# Patient Record
Sex: Female | Born: 1965 | Race: White | Hispanic: No | Marital: Married | Smoking: Never smoker
Health system: Southern US, Community
[De-identification: ages and names within clinical notes are randomized; demographics above are authoritative.]

## PROBLEM LIST (undated history)

## (undated) DIAGNOSIS — Z9889 Other specified postprocedural states: Secondary | ICD-10-CM

## (undated) DIAGNOSIS — Z8489 Family history of other specified conditions: Secondary | ICD-10-CM

## (undated) DIAGNOSIS — M1990 Unspecified osteoarthritis, unspecified site: Secondary | ICD-10-CM

## (undated) DIAGNOSIS — E049 Nontoxic goiter, unspecified: Secondary | ICD-10-CM

## (undated) DIAGNOSIS — E119 Type 2 diabetes mellitus without complications: Secondary | ICD-10-CM

## (undated) DIAGNOSIS — I10 Essential (primary) hypertension: Secondary | ICD-10-CM

## (undated) DIAGNOSIS — K589 Irritable bowel syndrome without diarrhea: Secondary | ICD-10-CM

## (undated) DIAGNOSIS — R112 Nausea with vomiting, unspecified: Secondary | ICD-10-CM

## (undated) HISTORY — PX: DILATION AND CURETTAGE OF UTERUS: SHX78

## (undated) HISTORY — PX: OTHER SURGICAL HISTORY: SHX169

## (undated) HISTORY — PX: DENTAL SURGERY: SHX609

## (undated) HISTORY — PX: CHOLECYSTECTOMY: SHX55

## (undated) HISTORY — PX: ABDOMINAL HYSTERECTOMY: SHX81

---

## 1998-09-07 ENCOUNTER — Other Ambulatory Visit: Admission: RE | Admit: 1998-09-07 | Discharge: 1998-09-07 | Payer: Self-pay | Admitting: *Deleted

## 1999-10-11 ENCOUNTER — Other Ambulatory Visit: Admission: RE | Admit: 1999-10-11 | Discharge: 1999-10-11 | Payer: Self-pay | Admitting: *Deleted

## 1999-10-28 ENCOUNTER — Observation Stay (HOSPITAL_COMMUNITY): Admission: AD | Admit: 1999-10-28 | Discharge: 1999-10-29 | Payer: Self-pay | Admitting: *Deleted

## 2000-10-23 ENCOUNTER — Other Ambulatory Visit: Admission: RE | Admit: 2000-10-23 | Discharge: 2000-10-23 | Payer: Self-pay | Admitting: *Deleted

## 2000-12-21 ENCOUNTER — Encounter (INDEPENDENT_AMBULATORY_CARE_PROVIDER_SITE_OTHER): Payer: Self-pay | Admitting: Specialist

## 2000-12-21 ENCOUNTER — Other Ambulatory Visit: Admission: RE | Admit: 2000-12-21 | Discharge: 2000-12-21 | Payer: Self-pay | Admitting: *Deleted

## 2000-12-27 ENCOUNTER — Encounter: Payer: Self-pay | Admitting: *Deleted

## 2000-12-27 ENCOUNTER — Ambulatory Visit (HOSPITAL_COMMUNITY): Admission: RE | Admit: 2000-12-27 | Discharge: 2000-12-27 | Payer: Self-pay | Admitting: *Deleted

## 2001-06-13 ENCOUNTER — Inpatient Hospital Stay (HOSPITAL_COMMUNITY): Admission: RE | Admit: 2001-06-13 | Discharge: 2001-06-15 | Payer: Self-pay | Admitting: Obstetrics and Gynecology

## 2001-06-13 ENCOUNTER — Encounter (INDEPENDENT_AMBULATORY_CARE_PROVIDER_SITE_OTHER): Payer: Self-pay | Admitting: Specialist

## 2002-06-13 ENCOUNTER — Other Ambulatory Visit: Admission: RE | Admit: 2002-06-13 | Discharge: 2002-06-13 | Payer: Self-pay | Admitting: Obstetrics and Gynecology

## 2003-07-28 ENCOUNTER — Ambulatory Visit (HOSPITAL_COMMUNITY): Admission: RE | Admit: 2003-07-28 | Discharge: 2003-07-28 | Payer: Self-pay | Admitting: Family Medicine

## 2003-07-28 ENCOUNTER — Encounter: Payer: Self-pay | Admitting: Family Medicine

## 2004-04-06 ENCOUNTER — Other Ambulatory Visit: Admission: RE | Admit: 2004-04-06 | Discharge: 2004-04-06 | Payer: Self-pay | Admitting: Obstetrics and Gynecology

## 2005-04-15 ENCOUNTER — Other Ambulatory Visit: Admission: RE | Admit: 2005-04-15 | Discharge: 2005-04-15 | Payer: Self-pay | Admitting: Obstetrics and Gynecology

## 2007-05-04 ENCOUNTER — Encounter: Admission: RE | Admit: 2007-05-04 | Discharge: 2007-05-04 | Payer: Self-pay | Admitting: Obstetrics and Gynecology

## 2007-11-05 ENCOUNTER — Encounter: Admission: RE | Admit: 2007-11-05 | Discharge: 2007-11-05 | Payer: Self-pay | Admitting: Obstetrics and Gynecology

## 2008-04-28 ENCOUNTER — Encounter: Admission: RE | Admit: 2008-04-28 | Discharge: 2008-04-28 | Payer: Self-pay | Admitting: Obstetrics and Gynecology

## 2009-05-06 ENCOUNTER — Encounter: Admission: RE | Admit: 2009-05-06 | Discharge: 2009-05-06 | Payer: Self-pay | Admitting: Obstetrics and Gynecology

## 2010-05-11 ENCOUNTER — Encounter: Admission: RE | Admit: 2010-05-11 | Discharge: 2010-05-11 | Payer: Self-pay | Admitting: Obstetrics and Gynecology

## 2011-04-29 NOTE — Discharge Summary (Signed)
[facility]  Patient:    Dana Bruce, Dana Bruce Visit Number: [id number] MRN: [id number]          Service Type: GYN Location: 4W [id number] 01 Attending Physician:  Rhina Brackett Dictated by:   Duke Salvia. Marcelle Overlie, M.D. Admit Date:  06/13/2001 Discharge Date: 06/15/2001                             Discharge Summary  DISCHARGE DIAGNOSES: 1. Leiomyoma. 2. Total abdominal hysterectomy this admission.  For summary of the history and physical examination, please see admission H&P for details.  Briefly, the patient is a 45 year old, G3, P2, ectopic 1, with severe menorrhagia secondary to leiomyoma, presents now for definitive hysterectomy.  HOSPITAL COURSE:  On 7/3, under general anesthesia, the patient underwent total abdominal hysterectomy with an estimated blood loss of 250 cc.  A 14 week sized uterus was noted.  On the first postoperative day, 7/4, hemoglobin was 8.5, her diet was advanced, she remained afebrile at that time.  On 7/5, she was ambulating without difficulty, had established normal bowel and urinary function, again remained afebrile, was ready for discharge at that point.  She was discharged home with the clips still intact.  LABORATORY DATA:  Preoperative hemoglobin 9.9, postoperative on 7/4 was 8.5. Admission UA showed large hemoglobin, small leukocyte esterase, few epithelial cells.  Blood type is O-.  Antibody screen was negative.  Pathology report showed a 637 g uterus with chronic cervicitis, benign endometrial polyp, and extensive adenomyosis.  DISPOSITION:  The patient is discharged on Tylox p.r.n. pain.  Will continue with her iron daily.  FOLLOWUP:  Will return to our office in 3 days to have the clips removed. Advised to report any incisional redness or drainage, increased pain or bleeding, or fever over 101.  She was given specific instructions regarding diet, sex, exercise.  CONDITION ON DISCHARGE:  Good.  ACTIVITY:   Graded increase. Dictated by:   Duke Salvia. Marcelle Overlie, M.D. Attending Physician:  Rhina Brackett DD:  08/22/01 TD:  08/22/01 Job: [id number]

## 2011-04-29 NOTE — H&P (Signed)
[facility]  Patient:    Dana Bruce, Dana Bruce                         MRN: [id number] Adm. Date:  06/13/01 Attending:  Duke Salvia. Marcelle Overlie, M.D.                         History and Physical  CHIEF COMPLAINT:  Menorrhagia, leiomyoma.  HISTORY OF PRESENT ILLNESS:  A 45 year old G3, P2, ectopic 1.  She has had one prior salpingectomy.  I had last seen her in 1997.  She presented back in March 2002 after seeing Dr. Elliot Gault for evaluation of menorrhagia.  She stated that last year her hemoglobin had got down to the 3.5 range and she was transfused, having extremely heavy periods.  He had started her on Provera for management of her bleeding.  Recent ultrasound report showed an 8.5 cm fibroid.  She has been on iron and Provera and presents now for definitive TAH.  This procedure including risks relative to bleeding, infection, transfusion, adjacent organ injury discussed with her.  Other risks regarding wound infection, phlebitis discussed also.  The ultrasound report was dated December 27, 2000 that showed a large fibroid in the posterior uterus 8.6 cm.  No evidence of any endometrial thickening. EMB done by Dr. Elliot Gault January 2002 showed blood mixed with proliferative endometrium.  No hyperplasia noted.  PAST MEDICAL HISTORY:  Two vaginal deliveries at term without complication.  REVIEW OF SYSTEMS:  Significant for history of chronic anovulation.  She has had two prior D&Cs for miscarriage and also a laparoscopic salpingectomy in 1994.  Dr. Lurene Shadow did a laparoscopic cholecystectomy in 1995.  PHYSICAL EXAMINATION  VITAL SIGNS:  Temperature 98.2, blood pressure 120/82.  HEENT:  Unremarkable.  NECK:  Supple without masses.  LUNGS:  Clear.  CARDIOVASCULAR:  Regular rate and rhythm without murmurs, rubs, gallops noted.  BREASTS:  Without masses.  ABDOMEN:  Soft, flat, nontender.  PELVIC:  Difficult due to her weight.  Uterus is mid position, upper limits  of normal size.  Adnexa:  Unremarkable.  IMPRESSION:  Symptomatic leiomyoma with significant anemia.  PLAN:  TAH.  Procedure and risks discussed as above. DD:  06/07/01 TD:  06/07/01 Job: [id number]

## 2011-04-29 NOTE — Op Note (Signed)
[facility]  Patient:    Dana Bruce, Dana Bruce                   MRN: [id number] Proc. Date: 06/13/01 Adm. Date:  [id number] Attending:  Rhina Brackett                           Operative Report  PREOPERATIVE DIAGNOSES:  Symptomatic leiomyoma with menorrhagia and pelvic pain.  POSTOPERATIVE DIAGNOSES:  Symptomatic leiomyoma with menorrhagia and pelvic pain.  PROCEDURE:  Total abdominal hysterectomy.  SURGEON:  Dr. Marcelle Overlie.  ASSISTANT:  Dr. Henderson Cloud.  ANESTHESIA:  General endotracheal.  COMPLICATIONS:  None.  DRAINS:  Foley catheter.  ESTIMATED BLOOD LOSS:  250  DESCRIPTION OF PROCEDURE:  The patient was sent to the operating room after an adequate level of general endotracheal anesthesia was obtained. With the patient supine, the abdomen was prepped and draped in the usual manner for a sterile abdominal procedure. The vagina was prepped separately with Betadine, Foley catheter positioned. Three finger breadths above the symphysis, a Pfannenstiel incision was made, carried down to the fascia with ______ and extended transversely. The rectus muscle was divided in the midline, peritoneum entered superiorly without incident and extended in a vertical manner. Retractor was positioned, bowels packed superiorly out of the field. The patient placed in slight Trendelenburg. The uterus itself was enlarged to 14 week size, adnexa were unremarkable. The utero-ovarian pedicles were clamped and held for traction. The left round ligament was clamped ______ ligated with #0 Dexon. The left utero-ovarian pedicle was isolated, clamped, divided, first free tied followed by a suture ligature of #0 Dexon, the exact same repeated on the opposite side conserving both ovaries. The peritoneum was carried around to the midline and with sharp and blunt dissection, the bladder was advanced below the cervical vaginal junction. The uterine vasculature pedicles were then  skeletonized, clamped, divided and suture ligated with #0 Dexon. In a sequential manner staying close to the uterus and assuring that the bladder was well below the cardinal ligament, the uterosacral ligament and cervicovaginal pedicles were clamped, divided and suture ligated with #0 Dexon. The cuff was closed with interrupted figure-of-eight #0 Dexon sutures. The pelvis was irrigated with saline and aspirated, all major pedicles carefully inspected and noted to be hemostatic. Prior to closure, sponge, needle and instrument counts were reported as correct x 2. The appendix was inspected and noted to be normal. There were no other abnormalities noted. The rectus muscles reapproximated with 2-0 Dexon interrupted sutures. The fascia closed from laterally to mid laterally with a #0 PDS suture. The subcutaneous ______ was hemostatic, clips and Steri-Strips on the skin. The patient tolerated the procedure well and went to the recovery room in good condition. DD:  06/13/01 TD:  06/13/01 Job: [id number]

## 2011-05-16 ENCOUNTER — Other Ambulatory Visit: Payer: Self-pay | Admitting: Obstetrics and Gynecology

## 2011-05-16 DIAGNOSIS — Z1231 Encounter for screening mammogram for malignant neoplasm of breast: Secondary | ICD-10-CM

## 2011-06-01 ENCOUNTER — Ambulatory Visit
Admission: RE | Admit: 2011-06-01 | Discharge: 2011-06-01 | Disposition: A | Discharge status: Home / Self Care | Payer: BC Managed Care – PPO | Source: Ambulatory Visit | Attending: Obstetrics and Gynecology | Admitting: Obstetrics and Gynecology

## 2011-06-01 DIAGNOSIS — Z1231 Encounter for screening mammogram for malignant neoplasm of breast: Secondary | ICD-10-CM

## 2012-09-07 ENCOUNTER — Other Ambulatory Visit: Payer: Self-pay | Admitting: Obstetrics and Gynecology

## 2012-09-07 DIAGNOSIS — Z1231 Encounter for screening mammogram for malignant neoplasm of breast: Secondary | ICD-10-CM

## 2012-10-03 ENCOUNTER — Ambulatory Visit: Payer: BC Managed Care – PPO

## 2012-10-17 ENCOUNTER — Ambulatory Visit
Admission: RE | Admit: 2012-10-17 | Discharge: 2012-10-17 | Disposition: A | Discharge status: Home / Self Care | Payer: BC Managed Care – PPO | Source: Ambulatory Visit | Attending: Obstetrics and Gynecology | Admitting: Obstetrics and Gynecology

## 2012-10-17 DIAGNOSIS — Z1231 Encounter for screening mammogram for malignant neoplasm of breast: Secondary | ICD-10-CM

## 2013-09-18 ENCOUNTER — Other Ambulatory Visit: Payer: Self-pay

## 2013-09-18 DIAGNOSIS — Z1231 Encounter for screening mammogram for malignant neoplasm of breast: Secondary | ICD-10-CM

## 2013-10-21 ENCOUNTER — Ambulatory Visit
Admission: RE | Admit: 2013-10-21 | Discharge: 2013-10-21 | Disposition: A | Discharge status: Home / Self Care | Payer: BC Managed Care – PPO | Source: Ambulatory Visit

## 2013-10-21 DIAGNOSIS — Z1231 Encounter for screening mammogram for malignant neoplasm of breast: Secondary | ICD-10-CM

## 2013-10-25 ENCOUNTER — Other Ambulatory Visit: Payer: Self-pay | Admitting: Obstetrics and Gynecology

## 2013-10-25 DIAGNOSIS — R928 Other abnormal and inconclusive findings on diagnostic imaging of breast: Secondary | ICD-10-CM

## 2013-11-13 ENCOUNTER — Ambulatory Visit
Admission: RE | Admit: 2013-11-13 | Discharge: 2013-11-13 | Disposition: A | Discharge status: Home / Self Care | Payer: BC Managed Care – PPO | Source: Ambulatory Visit | Attending: Obstetrics and Gynecology | Admitting: Obstetrics and Gynecology

## 2013-11-13 DIAGNOSIS — R928 Other abnormal and inconclusive findings on diagnostic imaging of breast: Secondary | ICD-10-CM

## 2014-01-23 ENCOUNTER — Other Ambulatory Visit (HOSPITAL_COMMUNITY): Payer: Self-pay | Admitting: Obstetrics and Gynecology

## 2014-01-23 DIAGNOSIS — E041 Nontoxic single thyroid nodule: Secondary | ICD-10-CM

## 2014-01-27 ENCOUNTER — Ambulatory Visit (HOSPITAL_COMMUNITY): Payer: BC Managed Care – PPO

## 2014-01-30 ENCOUNTER — Ambulatory Visit (HOSPITAL_COMMUNITY): Payer: BC Managed Care – PPO

## 2014-02-06 ENCOUNTER — Ambulatory Visit (HOSPITAL_COMMUNITY): Payer: BC Managed Care – PPO

## 2014-02-20 ENCOUNTER — Ambulatory Visit (HOSPITAL_COMMUNITY)
Admission: RE | Admit: 2014-02-20 | Discharge: 2014-02-20 | Disposition: A | Discharge status: Home / Self Care | Payer: BC Managed Care – PPO | Source: Ambulatory Visit | Attending: Obstetrics and Gynecology | Admitting: Obstetrics and Gynecology

## 2014-02-20 DIAGNOSIS — E042 Nontoxic multinodular goiter: Secondary | ICD-10-CM | POA: Insufficient documentation

## 2014-02-20 DIAGNOSIS — E041 Nontoxic single thyroid nodule: Secondary | ICD-10-CM | POA: Insufficient documentation

## 2014-02-27 ENCOUNTER — Other Ambulatory Visit: Payer: Self-pay | Admitting: Obstetrics and Gynecology

## 2014-02-27 DIAGNOSIS — E041 Nontoxic single thyroid nodule: Secondary | ICD-10-CM

## 2014-03-04 ENCOUNTER — Ambulatory Visit
Admission: RE | Admit: 2014-03-04 | Discharge: 2014-03-04 | Disposition: A | Discharge status: Home / Self Care | Payer: BC Managed Care – PPO | Source: Ambulatory Visit | Attending: Obstetrics and Gynecology | Admitting: Obstetrics and Gynecology

## 2014-03-04 ENCOUNTER — Other Ambulatory Visit (HOSPITAL_COMMUNITY)
Admission: RE | Admit: 2014-03-04 | Discharge: 2014-03-04 | Disposition: A | Discharge status: Home / Self Care | Payer: BC Managed Care – PPO | Source: Ambulatory Visit | Attending: Interventional Radiology | Admitting: Interventional Radiology

## 2014-03-04 DIAGNOSIS — E041 Nontoxic single thyroid nodule: Secondary | ICD-10-CM

## 2014-03-05 ENCOUNTER — Other Ambulatory Visit: Payer: BC Managed Care – PPO

## 2014-03-18 ENCOUNTER — Ambulatory Visit (INDEPENDENT_AMBULATORY_CARE_PROVIDER_SITE_OTHER): Payer: BC Managed Care – PPO | Admitting: Surgery

## 2014-03-24 ENCOUNTER — Other Ambulatory Visit (INDEPENDENT_AMBULATORY_CARE_PROVIDER_SITE_OTHER): Payer: Self-pay

## 2014-03-24 ENCOUNTER — Encounter (INDEPENDENT_AMBULATORY_CARE_PROVIDER_SITE_OTHER): Payer: Self-pay | Admitting: Surgery

## 2014-03-24 ENCOUNTER — Ambulatory Visit (INDEPENDENT_AMBULATORY_CARE_PROVIDER_SITE_OTHER): Payer: BC Managed Care – PPO | Admitting: Surgery

## 2014-03-24 VITALS — BP 160/104 | HR 68 | Temp 97.6°F | Resp 18 | Ht 67.0 in | Wt >= 6400 oz

## 2014-03-24 DIAGNOSIS — E049 Nontoxic goiter, unspecified: Secondary | ICD-10-CM

## 2014-03-24 DIAGNOSIS — E041 Nontoxic single thyroid nodule: Secondary | ICD-10-CM

## 2014-03-24 NOTE — Patient Instructions (Signed)

## 2014-03-24 NOTE — Progress Notes (Signed)
[facility]  Chief Complaint  Patient presents with  . New Evaluation    evaluate right thyroid nodule - referral from Dr. Richarda Overlieichard Holland    HISTORY: Patient is a 48 year old female referred by her gynecologist for evaluation of newly diagnosed thyroid nodule. Patient had complained about some changes in her neck. She was examined by her gynecologist and sent for ultrasound evaluation. This was performed in March 2015. This showed an enlarged thyroid gland with the right lobe measuring 6.3 cm and left lobe measuring 6.5 cm. There was a solitary dominant complex nodule in the right thyroid lobe measuring 2.9 cm. Ultrasound-guided fine-needle aspiration biopsy was performed. This shows benign cytopathology consistent with follicular nodule.  Patient has no prior history of thyroid disease. She has never been on thyroid medication. She has had no prior head or neck surgery. There is no family history of thyroid disease. There is no family history of endocrine neoplasms.  History reviewed. No pertinent past medical history.  Current Outpatient Prescriptions  Medication Sig Dispense Refill  . amLODipine (NORVASC) 10 MG tablet       . aspirin 81 MG tablet Take 81 mg by mouth daily.      . hydrocortisone-pramoxine (ANALPRAM-HC) 2.5-1 % rectal cream       . hyoscyamine (LEVSIN SL) 0.125 MG SL tablet       . metFORMIN (GLUCOPHAGE) 1000 MG tablet       . metoprolol (LOPRESSOR) 50 MG tablet       . ONE TOUCH ULTRA TEST test strip       . pioglitazone (ACTOS) 30 MG tablet       . promethazine (PHENERGAN) 25 MG tablet       . simvastatin (ZOCOR) 20 MG tablet       . valsartan-hydrochlorothiazide (DIOVAN-HCT) 320-25 MG per tablet        No current facility-administered medications for this visit.    Allergies  Allergen Reactions  . Lasix [Furosemide] Itching  . Penicillins Itching    History reviewed. No pertinent family history.  History   Social  History  . Marital Status: Married    Spouse Name: N/A    Number of Children: N/A  . Years of Education: N/A   Social History Main Topics  . Smoking status: Never Smoker   . Smokeless tobacco: Never Used  . Alcohol Use: No  . Drug Use: No  . Sexual Activity: None   Other Topics Concern  . None   Social History Narrative  . None    REVIEW OF SYSTEMS - PERTINENT POSITIVES ONLY: Denies tremor. Denies palpitation. Denies masses. Denies compressive symptoms.  EXAM: Filed Vitals:   03/24/14 1129  BP: 160/104  Pulse: 68  Temp: 97.6 F (36.4 C)  Resp: 18    GENERAL: well-developed, well-nourished, no acute distress HEENT: normocephalic; pupils equal and reactive; sclerae clear; dentition good; mucous membranes moist NECK:  Right thyroid lobe with palpable 2-3 cm nodule in the mid lobe, smooth, mobile, nontender; left low without palpable abnormality; asymmetric on extension; no palpable anterior or posterior cervical lymphadenopathy; no supraclavicular masses; no tenderness CHEST: clear to auscultation bilaterally without rales, rhonchi, or wheezes CARDIAC: regular rate and rhythm without significant murmur; peripheral pulses are full EXT:  non-tender without edema; no deformity NEURO: no gross focal deficits; no sign of tremor   LABORATORY RESULTS: See [facility] (CHL-Epic) for most recent results  RADIOLOGY RESULTS: See [facility] (CHL-Epic) for most recent  results  IMPRESSION: #1 solitary right thyroid nodule, 2.9 cm, complex, with benign cytopathology #2 thyroid goiter  PLAN: The patient and I discussed the above findings at length. We reviewed her ultrasound results and her fine-needle aspiration cytology results. We discussed options for management including observation and surgical resection.  I have recommended observation. I would like to see her back in 6 months for physical examination. Prior to that visit we will obtain a repeat thyroid ultrasound  and a TSH level.  Patient understands and agrees with this plan.  Velora Hecklerodd M. Aleece Loyd, MD, FACS General & Endocrine Surgery [facility]  Primary Care Physician: Lolita PatellaEADE,ROBERT ALEXANDER, MD

## 2014-03-27 ENCOUNTER — Telehealth (INDEPENDENT_AMBULATORY_CARE_PROVIDER_SITE_OTHER): Payer: Self-pay | Admitting: *Deleted

## 2014-03-27 NOTE — Telephone Encounter (Signed)
Pt advised of attached msg. 

## 2014-03-27 NOTE — Telephone Encounter (Signed)
LM for pt to call.  Please inform pt of her appt for her Ultrasound for 10.6.2015 @ 11:15 @ [facility] [address]  Victorino DikeJennifer

## 2014-05-06 ENCOUNTER — Other Ambulatory Visit: Payer: Self-pay | Admitting: Obstetrics and Gynecology

## 2014-05-06 DIAGNOSIS — R921 Mammographic calcification found on diagnostic imaging of breast: Secondary | ICD-10-CM

## 2014-05-16 ENCOUNTER — Ambulatory Visit
Admission: RE | Admit: 2014-05-16 | Discharge: 2014-05-16 | Disposition: A | Discharge status: Home / Self Care | Payer: BC Managed Care – PPO | Source: Ambulatory Visit | Attending: Obstetrics and Gynecology | Admitting: Obstetrics and Gynecology

## 2014-05-16 DIAGNOSIS — R921 Mammographic calcification found on diagnostic imaging of breast: Secondary | ICD-10-CM

## 2014-09-11 LAB — TSH: TSH: 2.953 u[IU]/mL (ref 0.350–4.500)

## 2014-09-16 ENCOUNTER — Ambulatory Visit
Admission: RE | Admit: 2014-09-16 | Discharge: 2014-09-16 | Disposition: A | Discharge status: Home / Self Care | Payer: BC Managed Care – PPO | Source: Ambulatory Visit | Attending: Surgery | Admitting: Surgery

## 2014-09-16 DIAGNOSIS — E049 Nontoxic goiter, unspecified: Secondary | ICD-10-CM

## 2014-09-16 DIAGNOSIS — E041 Nontoxic single thyroid nodule: Secondary | ICD-10-CM

## 2014-09-29 ENCOUNTER — Other Ambulatory Visit (INDEPENDENT_AMBULATORY_CARE_PROVIDER_SITE_OTHER): Payer: Self-pay

## 2014-09-29 ENCOUNTER — Ambulatory Visit (INDEPENDENT_AMBULATORY_CARE_PROVIDER_SITE_OTHER): Payer: BC Managed Care – PPO | Admitting: Surgery

## 2014-09-29 DIAGNOSIS — E042 Nontoxic multinodular goiter: Secondary | ICD-10-CM

## 2014-12-19 ENCOUNTER — Other Ambulatory Visit: Payer: Self-pay

## 2014-12-19 DIAGNOSIS — Z1231 Encounter for screening mammogram for malignant neoplasm of breast: Secondary | ICD-10-CM

## 2014-12-24 ENCOUNTER — Ambulatory Visit
Admission: RE | Admit: 2014-12-24 | Discharge: 2014-12-24 | Disposition: A | Discharge status: Home / Self Care | Payer: BLUE CROSS/BLUE SHIELD | Source: Ambulatory Visit

## 2014-12-24 DIAGNOSIS — Z1231 Encounter for screening mammogram for malignant neoplasm of breast: Secondary | ICD-10-CM

## 2015-09-14 ENCOUNTER — Ambulatory Visit
Admission: RE | Admit: 2015-09-14 | Discharge: 2015-09-14 | Disposition: A | Discharge status: Home / Self Care | Payer: BLUE CROSS/BLUE SHIELD | Source: Ambulatory Visit | Attending: Surgery | Admitting: Surgery

## 2015-09-14 DIAGNOSIS — E042 Nontoxic multinodular goiter: Secondary | ICD-10-CM

## 2015-09-24 ENCOUNTER — Other Ambulatory Visit: Payer: Self-pay | Admitting: Orthopaedic Surgery

## 2015-09-24 DIAGNOSIS — M25552 Pain in left hip: Secondary | ICD-10-CM

## 2015-09-25 ENCOUNTER — Other Ambulatory Visit (HOSPITAL_COMMUNITY): Payer: Self-pay | Admitting: Orthopaedic Surgery

## 2015-09-25 DIAGNOSIS — M25552 Pain in left hip: Secondary | ICD-10-CM

## 2015-09-30 ENCOUNTER — Ambulatory Visit (HOSPITAL_COMMUNITY): Payer: BLUE CROSS/BLUE SHIELD

## 2015-10-07 ENCOUNTER — Ambulatory Visit (HOSPITAL_COMMUNITY)
Admission: RE | Admit: 2015-10-07 | Discharge: 2015-10-07 | Disposition: A | Discharge status: Home / Self Care | Payer: BLUE CROSS/BLUE SHIELD | Source: Ambulatory Visit | Attending: Orthopaedic Surgery | Admitting: Orthopaedic Surgery

## 2015-10-07 DIAGNOSIS — M25552 Pain in left hip: Secondary | ICD-10-CM

## 2015-10-07 MED ORDER — BUPIVACAINE HCL (PF) 0.25 % IJ SOLN
INTRAMUSCULAR | Status: AC
  Filled 2015-10-07: qty 30

## 2015-10-07 MED ORDER — METHYLPREDNISOLONE ACETATE 40 MG/ML IJ SUSP
INTRAMUSCULAR | Status: AC
  Filled 2015-10-07: qty 1

## 2015-10-07 MED ORDER — METHYLPREDNISOLONE ACETATE 80 MG/ML IJ SUSP
INTRAMUSCULAR | Status: AC
  Filled 2015-10-07: qty 1

## 2016-03-21 ENCOUNTER — Other Ambulatory Visit: Payer: Self-pay

## 2016-03-21 DIAGNOSIS — Z1231 Encounter for screening mammogram for malignant neoplasm of breast: Secondary | ICD-10-CM

## 2016-04-12 ENCOUNTER — Ambulatory Visit
Admission: RE | Admit: 2016-04-12 | Discharge: 2016-04-12 | Disposition: A | Discharge status: Home / Self Care | Payer: BLUE CROSS/BLUE SHIELD | Source: Ambulatory Visit

## 2016-04-12 DIAGNOSIS — Z1231 Encounter for screening mammogram for malignant neoplasm of breast: Secondary | ICD-10-CM

## 2016-11-18 ENCOUNTER — Emergency Department: Payer: BLUE CROSS/BLUE SHIELD

## 2016-11-18 ENCOUNTER — Encounter: Payer: Self-pay | Admitting: Emergency Medicine

## 2016-11-18 ENCOUNTER — Emergency Department
Admission: EM | Admit: 2016-11-18 | Discharge: 2016-11-18 | Disposition: A | Discharge status: Home / Self Care | Payer: BLUE CROSS/BLUE SHIELD | Attending: Emergency Medicine | Admitting: Emergency Medicine

## 2016-11-18 DIAGNOSIS — Z7984 Long term (current) use of oral hypoglycemic drugs: Secondary | ICD-10-CM | POA: Diagnosis not present

## 2016-11-18 DIAGNOSIS — I10 Essential (primary) hypertension: Secondary | ICD-10-CM | POA: Diagnosis not present

## 2016-11-18 DIAGNOSIS — R109 Unspecified abdominal pain: Secondary | ICD-10-CM | POA: Diagnosis present

## 2016-11-18 DIAGNOSIS — M62838 Other muscle spasm: Secondary | ICD-10-CM | POA: Diagnosis not present

## 2016-11-18 DIAGNOSIS — E119 Type 2 diabetes mellitus without complications: Secondary | ICD-10-CM | POA: Insufficient documentation

## 2016-11-18 DIAGNOSIS — Z79899 Other long term (current) drug therapy: Secondary | ICD-10-CM | POA: Diagnosis not present

## 2016-11-18 HISTORY — DX: Type 2 diabetes mellitus without complications: E11.9

## 2016-11-18 HISTORY — DX: Unspecified osteoarthritis, unspecified site: M19.90

## 2016-11-18 HISTORY — DX: Irritable bowel syndrome, unspecified: K58.9

## 2016-11-18 HISTORY — DX: Essential (primary) hypertension: I10

## 2016-11-18 LAB — BASIC METABOLIC PANEL
Anion gap: 8 (ref 5–15)
BUN: 18 mg/dL (ref 6–20)
CALCIUM: 8.8 mg/dL — AB (ref 8.9–10.3)
CO2: 26 mmol/L (ref 22–32)
CREATININE: 0.79 mg/dL (ref 0.44–1.00)
Chloride: 101 mmol/L (ref 101–111)
GFR calc Af Amer: >60 mL/min (ref >60)
GLUCOSE: 128 mg/dL — AB (ref 65–99)
Potassium: 4.4 mmol/L (ref 3.5–5.1)
SODIUM: 135 mmol/L (ref 135–145)

## 2016-11-18 LAB — CBC WITH DIFFERENTIAL/PLATELET
Basophils Absolute: 0.1 10*3/uL (ref 0–0.1)
Basophils Relative: 1 %
EOS ABS: 0.2 10*3/uL (ref 0–0.7)
Eosinophils Relative: 2 %
HCT: 38.6 % (ref 35.0–47.0)
Hemoglobin: 12.9 g/dL (ref 12.0–16.0)
LYMPHS ABS: 1.6 10*3/uL (ref 1.0–3.6)
Lymphocytes Relative: 15 %
MCH: 27.2 pg (ref 26.0–34.0)
MCHC: 33.5 g/dL (ref 32.0–36.0)
MCV: 81.3 fL (ref 80.0–100.0)
MONO ABS: 0.6 10*3/uL (ref 0.2–0.9)
Monocytes Relative: 6 %
Neutro Abs: 8.3 10*3/uL — ABNORMAL HIGH (ref 1.4–6.5)
Neutrophils Relative %: 76 %
PLATELETS: 262 10*3/uL (ref 150–440)
RBC: 4.75 MIL/uL (ref 3.80–5.20)
RDW: 14.4 % (ref 11.5–14.5)
WBC: 10.8 10*3/uL (ref 3.6–11.0)

## 2016-11-18 LAB — URINALYSIS, COMPLETE (UACMP) WITH MICROSCOPIC
Bilirubin Urine: NEGATIVE
GLUCOSE, UA: NEGATIVE mg/dL
Hgb urine dipstick: NEGATIVE
Ketones, ur: 5 mg/dL — AB
Leukocytes, UA: NEGATIVE
Nitrite: NEGATIVE
PH: 5 (ref 5.0–8.0)
Protein, ur: 100 mg/dL — AB
Specific Gravity, Urine: 1.027 (ref 1.005–1.030)

## 2016-11-18 MED ORDER — TRAMADOL HCL 50 MG PO TABS: 50.0000 mg | ORAL_TABLET | Freq: Four times a day (QID) | ORAL | 0 refills | Status: DC | PRN

## 2016-11-18 MED ORDER — CYCLOBENZAPRINE HCL 10 MG PO TABS: 10.0000 mg | ORAL_TABLET | Freq: Three times a day (TID) | ORAL | 0 refills | Status: DC | PRN

## 2016-11-18 MED ORDER — OXYCODONE-ACETAMINOPHEN 5-325 MG PO TABS
2.0000 | ORAL_TABLET | Freq: Once | ORAL | Status: AC
  Administered 2016-11-18: 2 via ORAL
  Filled 2016-11-18: qty 2

## 2016-11-18 NOTE — ED Notes (Signed)
Pt denies needs at this time.  

## 2016-11-18 NOTE — ED Provider Notes (Signed)
[facility] Emergency Department Provider Note        Time seen: ----------------------------------------- 9:18 PM on 11/18/2016 -----------------------------------------    I have reviewed the triage vital signs and the nursing notes.   HISTORY  Chief Complaint Shortness of Breath    HPI Dana Bruce is a 50 y.o. female who presents to the ER for feeling like she was having a spasm in her right flank since last week. Yesterday she had to twist and wiped after a bowel movement feeling a spasm in her right flank. She denies fevers, chills, chest pain, shortness of breath, vomiting or diarrhea.   Past Medical History:  Diagnosis Date  . Arthritis   . Diabetes mellitus without complication (HCC)   . Hypertension   . IBS (irritable bowel syndrome)     Patient Active Problem List   Diagnosis Date Noted  . Right thyroid nodule, 2.9 cm 03/24/2014  . Thyroid goiter 03/24/2014    Past Surgical History:  Procedure Laterality Date  . ABDOMINAL HYSTERECTOMY    . CHOLECYSTECTOMY    . DENTAL SURGERY      Allergies Lasix [furosemide] and Penicillins  Social History Social History  Substance Use Topics  . Smoking status: Never Smoker  . Smokeless tobacco: Never Used  . Alcohol use No    Review of Systems Constitutional: Negative for fever. Cardiovascular: Negative for chest pain. Respiratory: Negative for shortness of breath. Gastrointestinal: Positive for abdominal pain Genitourinary: Negative for dysuria. Musculoskeletal: Negative for back pain. Skin: Negative for rash. Neurological: Negative for headaches, focal weakness or numbness.  10-point ROS otherwise negative.  ____________________________________________   PHYSICAL EXAM:  VITAL SIGNS: ED Triage Vitals  Enc Vitals Group     BP 11/18/16 1945 (!) 152/75     Pulse Rate 11/18/16 1945 80     Resp 11/18/16 1945 (!) 22     Temp 11/18/16 1945 97.6 F (36.4 C)     Temp src --       SpO2 11/18/16 1945 96 %     Weight 11/18/16 1946 (!) 432 lb (196 kg)     Height 11/18/16 1946 5\' 7"  (1.702 m)     Head Circumference --      Peak Flow --      Pain Score --      Pain Loc --      Pain Edu? --      Excl. in GC? --     Constitutional: Alert and oriented. Well appearing and in no distress.Extreme morbid obesity Eyes: Conjunctivae are normal. PERRL. Normal extraocular movements. ENT   Head: Normocephalic and atraumatic.   Nose: No congestion/rhinnorhea.   Mouth/Throat: Mucous membranes are moist.   Neck: No stridor. Cardiovascular: Normal rate, regular rhythm. No murmurs, rubs, or gallops. Respiratory: Normal respiratory effort without tachypnea nor retractions. Breath sounds are clear and equal bilaterally. No wheezes/rales/rhonchi. Gastrointestinal: Soft and nontender. Normal bowel sounds Musculoskeletal: Nontender with normal range of motion in all extremities. No lower extremity tenderness nor edema. Neurologic:  Normal speech and language. No gross focal neurologic deficits are appreciated.  Skin:  Skin is warm, dry and intact. No rash noted. Psychiatric: Mood and affect are normal. Speech and behavior are normal.  ____________________________________________  EKG: Interpreted by me. Sinus rhythm with a rate of 75 bpm, normal PR interval, normal QRS size, normal QT  ____________________________________________  ED COURSE:  Pertinent labs & imaging results that were available during my care of the patient were reviewed by  me and considered in my medical decision making (see chart for details). Clinical Course   Patient presents to ER with flank pain which is likely musculoskeletal. We will assess with labs and imaging.  Procedures ____________________________________________   LABS (pertinent positives/negatives)  Labs Reviewed  CBC WITH DIFFERENTIAL/PLATELET - Abnormal; Notable for the following:       Result Value   Neutro Abs 8.3 (*)     All other components within normal limits  BASIC METABOLIC PANEL - Abnormal; Notable for the following:    Glucose, Bld 128 (*)    Calcium 8.8 (*)    All other components within normal limits  URINE CULTURE  URINALYSIS, COMPLETE (UACMP) WITH MICROSCOPIC    RADIOLOGY Chest x-ray is unremarkable CT renal protocol IMPRESSION: 1. No renal stones or obstructive uropathy. Mild perinephric edema about both kidneys is nonspecific and likely chronic. Recommend correlation with urinalysis to exclude urinary tract infection. 2. Hepatosplenomegaly and hepatic steatosis. ____________________________________________  FINAL ASSESSMENT AND PLAN  Flank pain, muscle spasm  Plan: Patient with labs and imaging as dictated above. No etiology for flank pain other than a muscle spasm. I have advised heating pad, massage and stretching. She'll be prescribed Flexeril and tramadol and is encouraged to have close follow-up with her doctor.   Emily FilbertWilliams, Jonathan E, MD   Note: This dictation was prepared with Dragon dictation. Any transcriptional errors that result from this process are unintentional    Emily FilbertJonathan E Williams, MD 11/18/16 2253

## 2016-11-18 NOTE — ED Notes (Signed)
Pt provided with cup and wipe for urine sample. Pt instructed on how to obtain a clean catch urine.

## 2016-11-18 NOTE — ED Notes (Signed)
Report from laura, rn 

## 2016-11-18 NOTE — ED Triage Notes (Signed)
Pt states that she has been feeling pain on her right flank since last week but yesterday she " had to twist and wipe and felt a spasm" in her right flank. Pt has diminished lung sounds on the right side. Pt is in NAD at this time.

## 2016-11-20 LAB — URINE CULTURE

## 2016-11-21 ENCOUNTER — Other Ambulatory Visit: Payer: Self-pay | Admitting: Surgery

## 2016-11-21 DIAGNOSIS — E049 Nontoxic goiter, unspecified: Secondary | ICD-10-CM

## 2016-12-20 ENCOUNTER — Ambulatory Visit
Admission: RE | Admit: 2016-12-20 | Discharge: 2016-12-20 | Disposition: A | Discharge status: Home / Self Care | Payer: BLUE CROSS/BLUE SHIELD | Source: Ambulatory Visit | Attending: Surgery | Admitting: Surgery

## 2016-12-20 DIAGNOSIS — E049 Nontoxic goiter, unspecified: Secondary | ICD-10-CM

## 2017-03-12 IMAGING — US US SOFT TISSUE HEAD/NECK
1 series · 14 of 25 positions shown · non-contrast
Comparison: 02/20/2014, 03/04/2014, 09/16/2014

CLINICAL DATA: 48-year-old female with thyroid nodules.

Previous biopsy 03/04/2014
EXAM:
THYROID ULTRASOUND
TECHNIQUE: Ultrasound examination of the thyroid gland and adjacent soft
tissues was performed.

[Series 1: us soft tissue head/neck · 0.10mm/px · 14 of 45 slices shown]
[im 1/45]
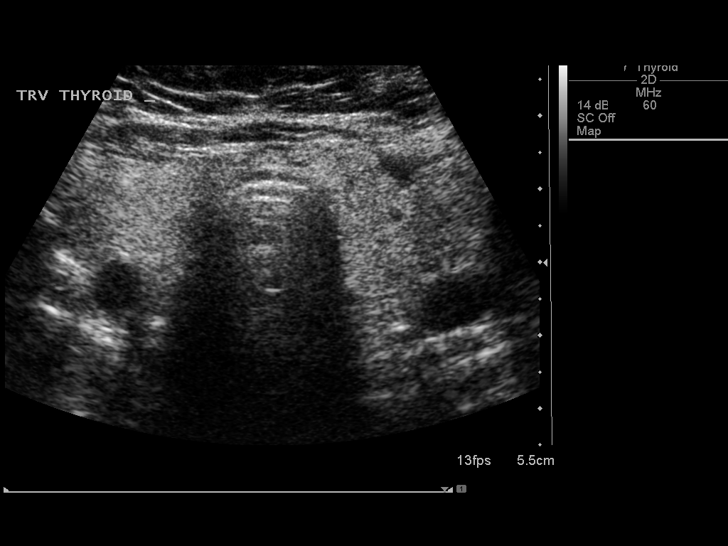
[im 4/45]
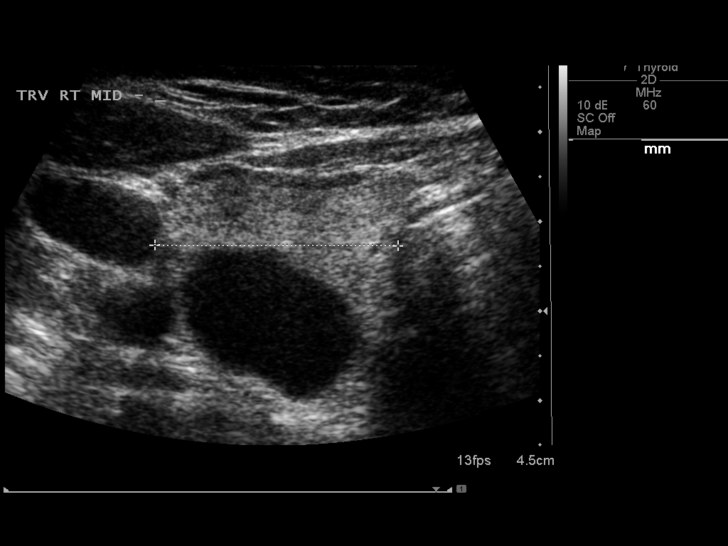
[im 8/45]
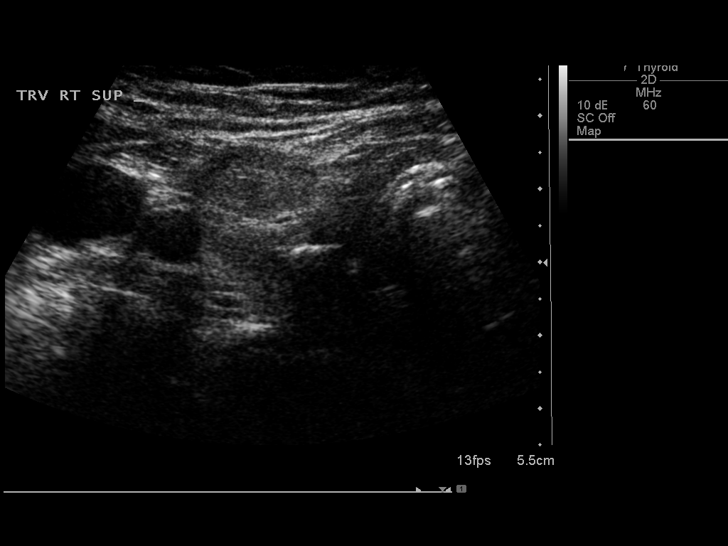
[im 12/45]
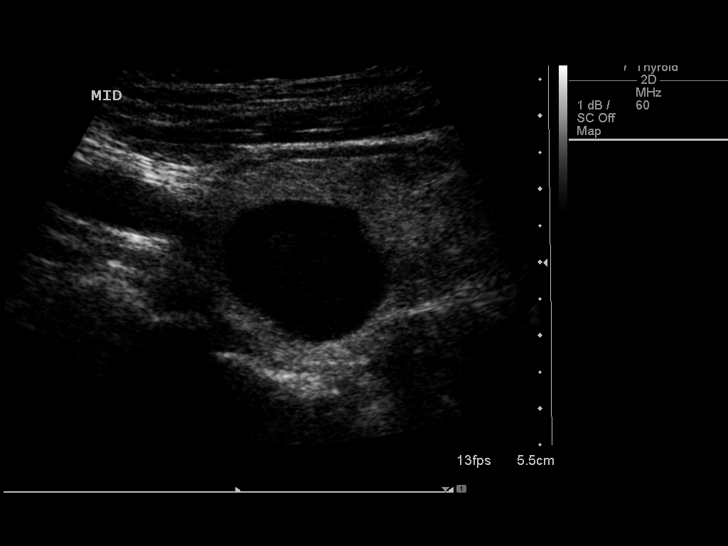
[im 15/45]
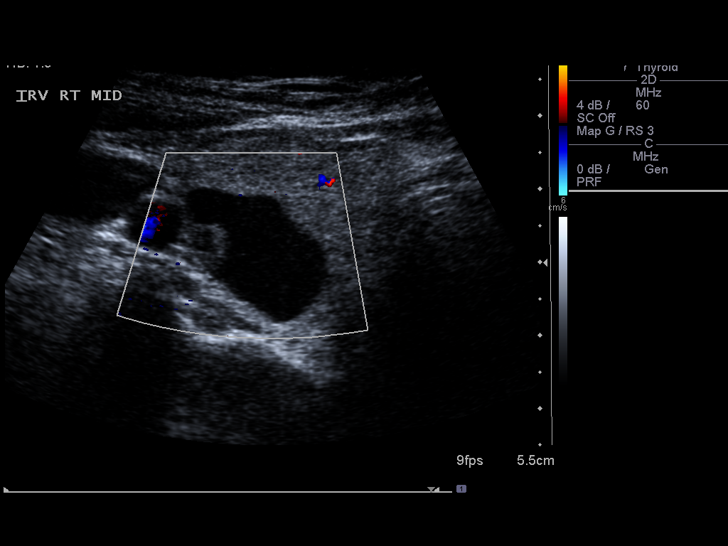
[im 17/45]
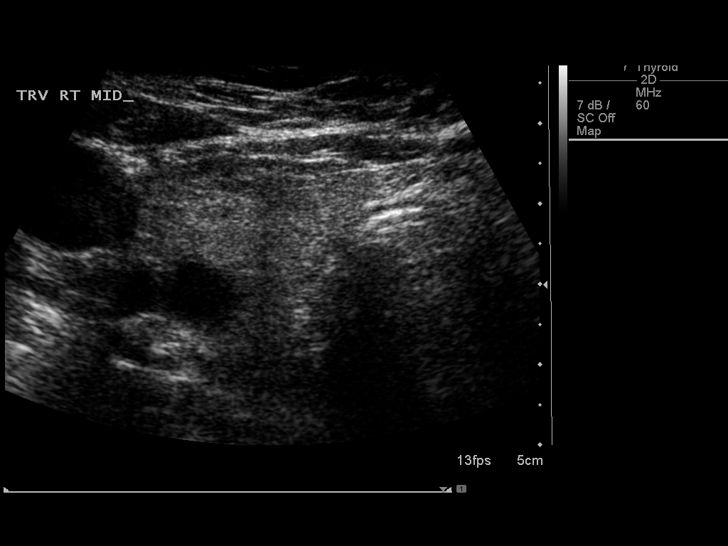
[im 21/45]
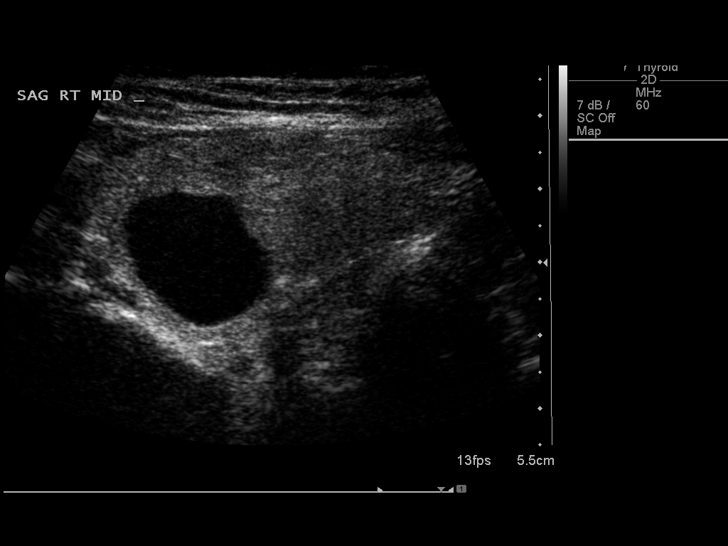
[im 24/45]
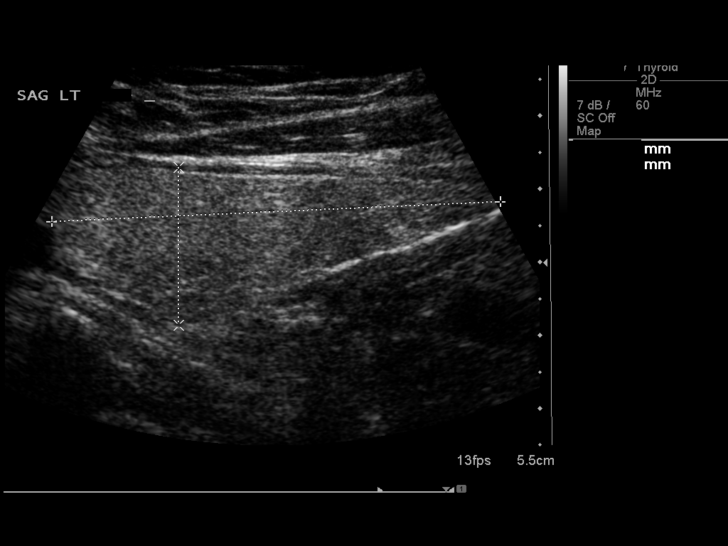
[im 28/45]
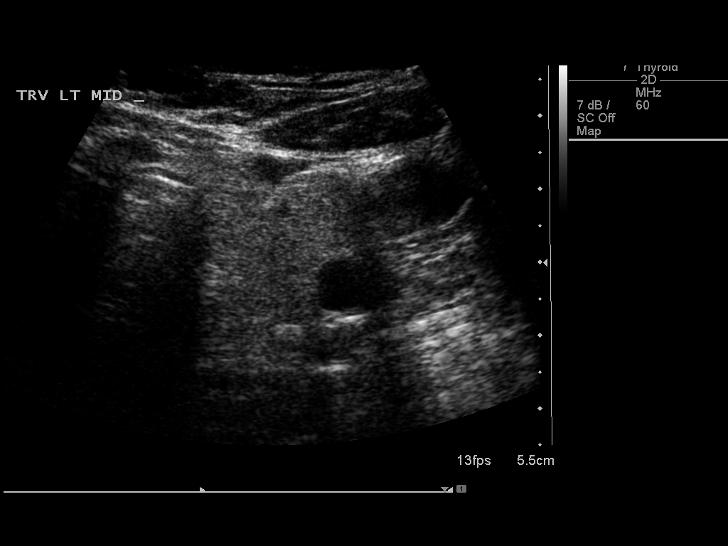
[im 30/45]
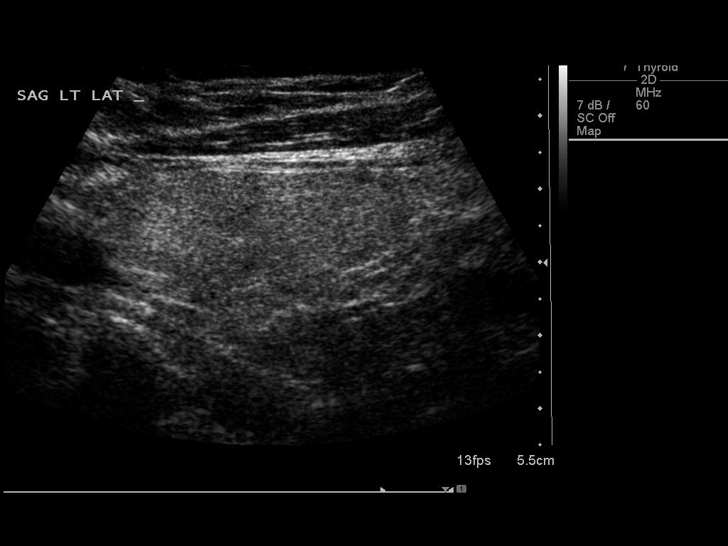
[im 34/45]
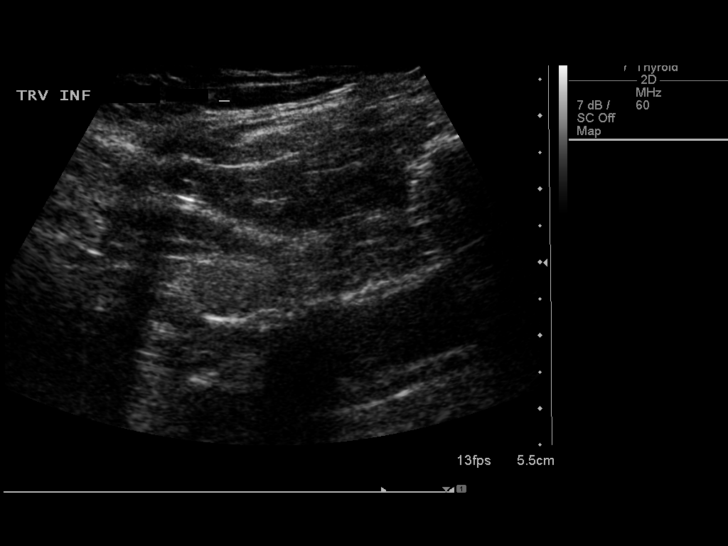
[im 37/45]
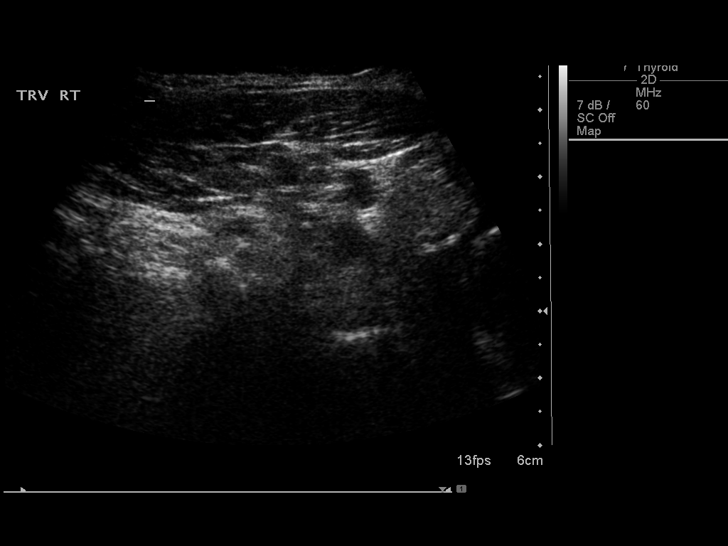
[im 41/45]
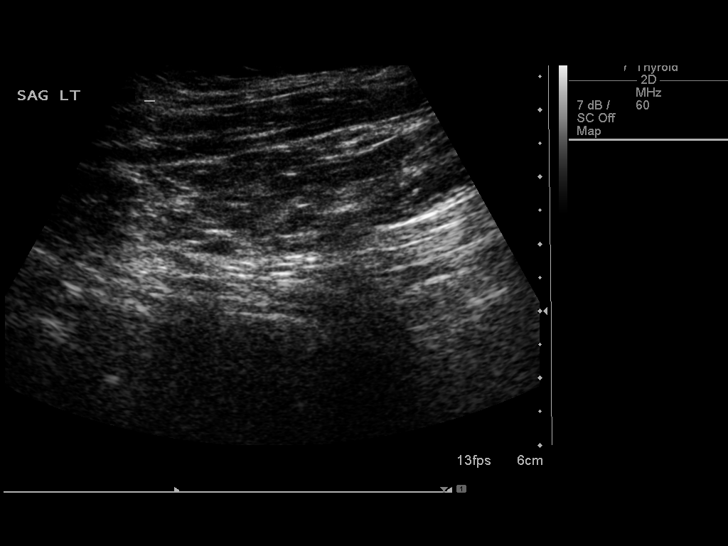
[im 45/45]
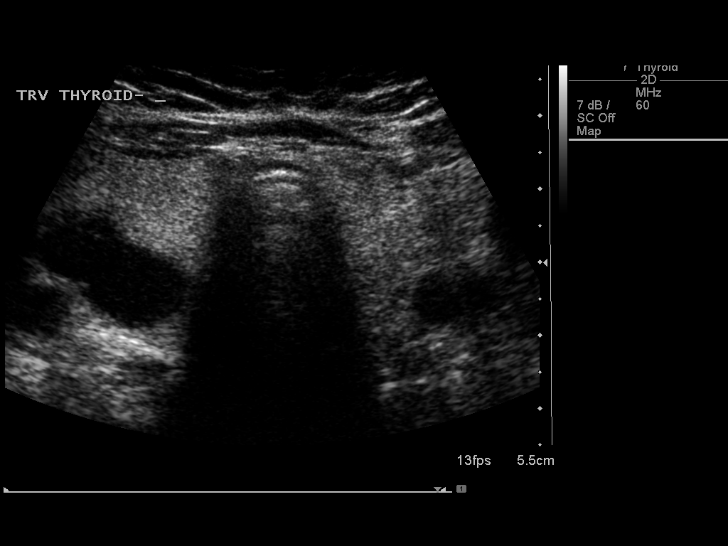

[14 of 25 positions shown; findings below may reference images not displayed]

FINDINGS: Right thyroid lobe

Measurements: 5.9 cm x 2.8 cm x 2.7 cm. Heterogeneous thyroid tissue
without significant increased flow. Cystic nodule with mural soft
tissue component again present within the mid right thyroid. 2.5 cm
x 1.5 cm x 2.3 cm. (previous: 3.3 cm x 2.7 cm x 2.6 cm).

Inferior nodule measures 1 cm

Left thyroid lobe

Measurements: 6.1 cm x 2.2 cm x 2.1 cm. Heterogeneous appearance of
left thyroid tissue without significant increased flow.

Isthmus

Thickness: 4 mm.  No nodules visualized.

Lymphadenopathy

None visualized.
IMPRESSION: Multinodular thyroid. The right-sided dominant nodule has decreased
in size from comparison and has been biopsied previously. Recommend
correlation with prior biopsy result.

## 2017-03-29 ENCOUNTER — Other Ambulatory Visit: Payer: Self-pay | Admitting: Gastroenterology

## 2017-05-16 ENCOUNTER — Other Ambulatory Visit: Payer: Self-pay | Admitting: Gastroenterology

## 2017-05-16 ENCOUNTER — Encounter (HOSPITAL_COMMUNITY): Payer: Self-pay | Admitting: *Deleted

## 2017-05-17 ENCOUNTER — Ambulatory Visit (HOSPITAL_COMMUNITY)
Admission: RE | Admit: 2017-05-17 | Discharge: 2017-05-17 | Disposition: A | Discharge status: Home / Self Care | Payer: BLUE CROSS/BLUE SHIELD | Source: Ambulatory Visit | Attending: Gastroenterology | Admitting: Gastroenterology

## 2017-05-17 ENCOUNTER — Encounter (HOSPITAL_COMMUNITY)
Admission: RE | Disposition: A | Discharge status: Home / Self Care | Payer: Self-pay | Source: Ambulatory Visit | Attending: Gastroenterology

## 2017-05-17 ENCOUNTER — Encounter (HOSPITAL_COMMUNITY): Payer: Self-pay | Admitting: *Deleted

## 2017-05-17 ENCOUNTER — Ambulatory Visit (HOSPITAL_COMMUNITY): Payer: BLUE CROSS/BLUE SHIELD | Admitting: Registered Nurse

## 2017-05-17 DIAGNOSIS — E118 Type 2 diabetes mellitus with unspecified complications: Secondary | ICD-10-CM | POA: Diagnosis not present

## 2017-05-17 DIAGNOSIS — K5730 Diverticulosis of large intestine without perforation or abscess without bleeding: Secondary | ICD-10-CM | POA: Diagnosis not present

## 2017-05-17 DIAGNOSIS — K644 Residual hemorrhoidal skin tags: Secondary | ICD-10-CM | POA: Diagnosis not present

## 2017-05-17 DIAGNOSIS — Z1211 Encounter for screening for malignant neoplasm of colon: Secondary | ICD-10-CM | POA: Insufficient documentation

## 2017-05-17 DIAGNOSIS — I10 Essential (primary) hypertension: Secondary | ICD-10-CM | POA: Insufficient documentation

## 2017-05-17 DIAGNOSIS — M1990 Unspecified osteoarthritis, unspecified site: Secondary | ICD-10-CM | POA: Diagnosis not present

## 2017-05-17 HISTORY — DX: Nontoxic goiter, unspecified: E04.9

## 2017-05-17 HISTORY — DX: Other specified postprocedural states: R11.2

## 2017-05-17 HISTORY — DX: Family history of other specified conditions: Z84.89

## 2017-05-17 HISTORY — PX: COLONOSCOPY WITH PROPOFOL: SHX5780

## 2017-05-17 HISTORY — DX: Other specified postprocedural states: Z98.890

## 2017-05-17 LAB — GLUCOSE, CAPILLARY: GLUCOSE-CAPILLARY: 111 mg/dL — AB (ref 65–99)

## 2017-05-17 SURGERY — COLONOSCOPY WITH PROPOFOL
Anesthesia: Monitor Anesthesia Care

## 2017-05-17 MED ORDER — ONDANSETRON HCL 4 MG/2ML IJ SOLN
INTRAMUSCULAR | Status: DC | PRN
  Administered 2017-05-17: 4 mg via INTRAVENOUS

## 2017-05-17 MED ORDER — PROPOFOL 10 MG/ML IV BOLUS
INTRAVENOUS | Status: AC
  Filled 2017-05-17: qty 40

## 2017-05-17 MED ORDER — PROPOFOL 10 MG/ML IV BOLUS
INTRAVENOUS | Status: AC
  Filled 2017-05-17: qty 20

## 2017-05-17 MED ORDER — PROPOFOL 500 MG/50ML IV EMUL
INTRAVENOUS | Status: DC | PRN
  Administered 2017-05-17: 150 ug/kg/min via INTRAVENOUS

## 2017-05-17 MED ORDER — DEXAMETHASONE SODIUM PHOSPHATE 10 MG/ML IJ SOLN
INTRAMUSCULAR | Status: DC | PRN
  Administered 2017-05-17: 10 mg via INTRAVENOUS

## 2017-05-17 MED ORDER — LACTATED RINGERS IV SOLN
INTRAVENOUS | Status: DC
Start: 2017-05-17 — End: 2017-05-17
  Administered 2017-05-17: 1000 mL via INTRAVENOUS

## 2017-05-17 MED ORDER — ROCURONIUM BROMIDE 50 MG/5ML IV SOSY
PREFILLED_SYRINGE | INTRAVENOUS | Status: AC
  Filled 2017-05-17: qty 5

## 2017-05-17 MED ORDER — SODIUM CHLORIDE 0.9 % IV SOLN: INTRAVENOUS | Status: DC

## 2017-05-17 MED ORDER — LIDOCAINE 2% (20 MG/ML) 5 ML SYRINGE
INTRAMUSCULAR | Status: AC
  Filled 2017-05-17: qty 5

## 2017-05-17 MED ORDER — SODIUM CHLORIDE 0.9 % IV SOLN
INTRAVENOUS | Status: DC
Start: 2017-05-17 — End: 2017-05-17

## 2017-05-17 SURGICAL SUPPLY — 22 items

## 2017-05-17 NOTE — Anesthesia Preprocedure Evaluation (Signed)
Anesthesia Evaluation  Patient identified by MRN, date of birth, ID band Patient awake    Reviewed: Allergy & Precautions, H&P , NPO status , Patient's Chart, lab work & pertinent test results  History of Anesthesia Complications (+) PONV  Airway Mallampati: II   Neck ROM: full    Dental   Pulmonary neg pulmonary ROS,    breath sounds clear to auscultation       Cardiovascular hypertension,  Rhythm:regular Rate:Normal     Neuro/Psych    GI/Hepatic   Endo/Other  diabetes, Type 2  Renal/GU      Musculoskeletal  (+) Arthritis ,   Abdominal   Peds  Hematology   Anesthesia Other Findings   Reproductive/Obstetrics                             Anesthesia Physical Anesthesia Plan  ASA: II  Anesthesia Plan: MAC   Post-op Pain Management:    Induction: Intravenous  PONV Risk Score and Plan: 3 and Ondansetron, Dexamethasone, Propofol, Midazolam and Treatment may vary due to age  Airway Management Planned:   Additional Equipment:   Intra-op Plan:   Post-operative Plan:   Informed Consent: I have reviewed the patients History and Physical, chart, labs and discussed the procedure including the risks, benefits and alternatives for the proposed anesthesia with the patient or authorized representative who has indicated his/her understanding and acceptance.     Plan Discussed with: CRNA, Anesthesiologist and Surgeon  Anesthesia Plan Comments:         Anesthesia Quick Evaluation

## 2017-05-17 NOTE — Transfer of Care (Signed)
Immediate Anesthesia Transfer of Care Note  Patient: Dana Bruce  Procedure(s) Performed: Procedure(s): COLONOSCOPY WITH PROPOFOL (N/A)  Patient Location: PACU  Anesthesia Type:MAC  Level of Consciousness: awake, alert , oriented and patient cooperative  Airway & Oxygen Therapy: Patient Spontanous Breathing and Patient connected to face mask oxygen  Post-op Assessment: Report given to RN, Post -op Vital signs reviewed and stable and Patient moving all extremities X 4  Post vital signs: stable  Last Vitals:  Vitals:   05/17/17 0838 05/17/17 1046  BP: (!) 174/78 (!) 131/57  Pulse:  87  Resp: 14 10  Temp: 36.4 C 36.4 C    Last Pain:  Vitals:   05/17/17 1046  TempSrc: Oral         Complications: No apparent anesthesia complications

## 2017-05-17 NOTE — Discharge Instructions (Signed)
YOU HAD AN ENDOSCOPIC PROCEDURE TODAY: Refer to the procedure report and other information in the discharge instructions given to you for any specific questions about what was found during the examination. If this information does not answer your questions, please call [facility] office at [phone] to clarify.  ° °YOU SHOULD EXPECT: Some feelings of bloating in the abdomen. Passage of more gas than usual. Walking can help get rid of the air that was put into your GI tract during the procedure and reduce the bloating. If you had a lower endoscopy (such as a colonoscopy or flexible sigmoidoscopy) you may notice spotting of blood in your stool or on the toilet paper. Some abdominal soreness may be present for a day or two, also. ° °DIET: Your first meal following the procedure should be a light meal and then it is ok to progress to your normal diet. A half-sandwich or bowl of soup is an example of a good first meal. Heavy or fried foods are harder to digest and may make you feel nauseous or bloated. Drink plenty of fluids but you should avoid alcoholic beverages for 24 hours. If you had a esophageal dilation, please see attached instructions for diet.  ° °ACTIVITY: Your care partner should take you home directly after the procedure. You should plan to take it easy, moving slowly for the rest of the day. You can resume normal activity the day after the procedure however YOU SHOULD NOT DRIVE, use power tools, machinery or perform tasks that involve climbing or major physical exertion for 24 hours (because of the sedation medicines used during the test).  ° °SYMPTOMS TO REPORT IMMEDIATELY: °A gastroenterologist can be reached at any hour. Please call [phone]  for any of the following symptoms:  °Following lower endoscopy (colonoscopy, flexible sigmoidoscopy) °Excessive amounts of blood in the stool  °Significant tenderness, worsening of abdominal pains  °Swelling of the abdomen that is new, acute  °Fever of 100° or  higher  °Following upper endoscopy (EGD, EUS, ERCP, esophageal dilation) °Vomiting of blood or coffee ground material  °New, significant abdominal pain  °New, significant chest pain or pain under the shoulder blades  °Painful or persistently difficult swallowing  °New shortness of breath  °Black, tarry-looking or red, bloody stools ° °FOLLOW UP:  °If any biopsies were taken you will be contacted by phone or by letter within the next 1-3 weeks. Call [phone]  if you have not heard about the biopsies in 3 weeks.  °Please also call with any specific questions about appointments or follow up tests. °YOU HAD AN ENDOSCOPIC PROCEDURE TODAY: Refer to the procedure report and other information in the discharge instructions given to you for any specific questions about what was found during the examination. If this information does not answer your questions, please call [facility] office at [phone] to clarify.  ° °YOU SHOULD EXPECT: Some feelings of bloating in the abdomen. Passage of more gas than usual. Walking can help get rid of the air that was put into your GI tract during the procedure and reduce the bloating. If you had a lower endoscopy (such as a colonoscopy or flexible sigmoidoscopy) you may notice spotting of blood in your stool or on the toilet paper. Some abdominal soreness may be present for a day or two, also. ° °DIET: Your first meal following the procedure should be a light meal and then it is ok to progress to your normal diet. A half-sandwich or bowl of soup is an example   of a good first meal. Heavy or fried foods are harder to digest and may make you feel nauseous or bloated. Drink plenty of fluids but you should avoid alcoholic beverages for 24 hours. If you had a esophageal dilation, please see attached instructions for diet.  ° °ACTIVITY: Your care partner should take you home directly after the procedure. You should plan to take it easy, moving slowly for the rest of the day. You can resume  normal activity the day after the procedure however YOU SHOULD NOT DRIVE, use power tools, machinery or perform tasks that involve climbing or major physical exertion for 24 hours (because of the sedation medicines used during the test).  ° °SYMPTOMS TO REPORT IMMEDIATELY: °A gastroenterologist can be reached at any hour. Please call [phone]  for any of the following symptoms:  °Following lower endoscopy (colonoscopy, flexible sigmoidoscopy) °Excessive amounts of blood in the stool  °Significant tenderness, worsening of abdominal pains  °Swelling of the abdomen that is new, acute  °Fever of 100° or higher  °Following upper endoscopy (EGD, EUS, ERCP, esophageal dilation) °Vomiting of blood or coffee ground material  °New, significant abdominal pain  °New, significant chest pain or pain under the shoulder blades  °Painful or persistently difficult swallowing  °New shortness of breath  °Black, tarry-looking or red, bloody stools ° °FOLLOW UP:  °If any biopsies were taken you will be contacted by phone or by letter within the next 1-3 weeks. Call [phone]  if you have not heard about the biopsies in 3 weeks.  °Please also call with any specific questions about appointments or follow up tests. ° °

## 2017-05-17 NOTE — H&P (Signed)
Patient interval history reviewed.  Patient examined again.  There has been no change from documented H/P dated 05/16/17 (scanned into chart from our office) except as documented above.  Assessment:  1.  Colon cancer screening. 2.  BMI 66.  Plan:  1.  Colonoscopy. 2.  Risks (bleeding, infection, bowel perforation that could require surgery, sedation-related changes in cardiopulmonary systems), benefits (identification and possible treatment of source of symptoms, exclusion of certain causes of symptoms), and alternatives (watchful waiting, radiographic imaging studies, empiric medical treatment) of colonoscopy were explained to patient/family in detail and patient wishes to proceed.

## 2017-05-17 NOTE — Op Note (Signed)
[facility] Patient Name: Dana Bruce Procedure Date: 05/17/2017 MRN: [id number] Attending MD: Willis Modena , MD Date of Birth: [birth year] CSN: [id number] Age: 51 Admit Type: Outpatient Procedure:                Colonoscopy Indications:              Screening for colorectal malignant neoplasm, This                            is the patient's first colonoscopy Providers:                Willis Modena, MD, Dwain Sarna, RN, Beryle Beams, Technician, Anastasio Champion, CRNA Referring MD:             Elias Else, MD Medicines:                Monitored Anesthesia Care Complications:            No immediate complications. Estimated Blood Loss:     Estimated blood loss: none. Procedure:                Pre-Anesthesia Assessment:                           - Prior to the procedure, a History and Physical                            was performed, and patient medications and                            allergies were reviewed. The patient's tolerance of                            previous anesthesia was also reviewed. The risks                            and benefits of the procedure and the sedation                            options and risks were discussed with the patient.                            All questions were answered, and informed consent                            was obtained. Prior Anticoagulants: The patient has                            taken aspirin. ASA Grade Assessment: III - A                            patient with severe systemic disease. After  reviewing the risks and benefits, the patient was                            deemed in satisfactory condition to undergo the                            procedure.                           After obtaining informed consent, the colonoscope                            was passed under direct vision. Throughout the                            procedure, the patient's  blood pressure, pulse, and                            oxygen saturations were monitored continuously. The                            was introduced through the anus and advanced to the                            the cecum, identified by appendiceal orifice and                            ileocecal valve. The ileocecal valve, appendiceal                            orifice, and rectum were photographed. The entire                            colon was examined. The colonoscopy was performed                            without difficulty. The patient tolerated the                            procedure well. The quality of the bowel                            preparation was fair. Scope In: 10:18:54 AM Scope Out: 10:39:36 AM Scope Withdrawal Time: 0 hours 8 minutes 58 seconds  Total Procedure Duration: 0 hours 20 minutes 42 seconds  Findings:      The perianal exam findings include non-thrombosed external hemorrhoids.      A few small and large-mouthed diverticula were found in the sigmoid       colon and descending colon.      Bowel prep diffusely fair; semisolid and viscous stool obscured some       views throughout colon; diminutive or subtle sessile polyps could easily       have been missed.      Colon otherwise normal; no other polyps, masses, vascular ectasias, or  inflammatory changes were seen.      The retroflexed view of the distal rectum and anal verge was normal and       showed no anal or rectal abnormalities. Impression:               - Preparation of the colon was fair.                           - Non-thrombosed external hemorrhoids found on                            perianal exam.                           - Diverticulosis in the sigmoid colon and in the                            descending colon.                           - The distal rectum and anal verge are normal on                            retroflexion view.                           - Otherwise normal  colonoscopy to cecum, but with                            some limitations in light of fair bowel prep                            quality. Moderate Sedation:      None Recommendation:           - Patient has a contact number available for                            emergencies. The signs and symptoms of potential                            delayed complications were discussed with the                            patient. Return to normal activities tomorrow.                            Written discharge instructions were provided to the                            patient.                           - Discharge patient to home (ambulatory).                           - High fiber diet indefinitely.                           -  Continue present medications.                           - Recommend Cologuard test in 5 years. May consider                            cologuard as colon cancer test for patient                            henceforth.                           - Return to GI clinic PRN.                           - Return to referring physician as previously                            scheduled.                           - Repeat colonoscopy in 10 years for screening                            purposes. Procedure Code(s):        --- Professional ---                           [phone], Colonoscopy, flexible; diagnostic, including                            collection of specimen(s) by brushing or washing,                            when performed (separate procedure) Diagnosis Code(s):        --- Professional ---                           Z12.11, Encounter for screening for malignant                            neoplasm of colon                           K64.4, Residual hemorrhoidal skin tags                           K57.30, Diverticulosis of large intestine without                            perforation or abscess without bleeding CPT copyright 2016 [employer]. All rights  reserved. The codes documented in this report are preliminary and upon coder review may  be revised to meet current compliance requirements. Willis Modena, MD 05/17/2017 10:47:41 AM This report has been signed electronically. Number of Addenda: 0

## 2017-05-17 NOTE — Anesthesia Postprocedure Evaluation (Signed)
Anesthesia Post Note  Patient: Dana Bruce  Procedure(s) Performed: Procedure(s) (LRB): COLONOSCOPY WITH PROPOFOL (N/A)     Patient location during evaluation: PACU Anesthesia Type: MAC Level of consciousness: awake and alert Pain management: pain level controlled Vital Signs Assessment: post-procedure vital signs reviewed and stable Respiratory status: spontaneous breathing, nonlabored ventilation, respiratory function stable and patient connected to nasal cannula oxygen Cardiovascular status: stable and blood pressure returned to baseline Anesthetic complications: no    Last Vitals:  Vitals:   05/17/17 1046 05/17/17 1100  BP: (!) 131/57 (!) 127/59  Pulse: 87 82  Resp: 10 (!) 22  Temp: 36.4 C     Last Pain:  Vitals:   05/17/17 1046  TempSrc: Oral                 [name]

## 2017-05-19 ENCOUNTER — Encounter (HOSPITAL_COMMUNITY): Payer: Self-pay | Admitting: Gastroenterology

## 2017-08-04 ENCOUNTER — Other Ambulatory Visit: Payer: Self-pay | Admitting: Nephrology

## 2017-08-04 DIAGNOSIS — M7989 Other specified soft tissue disorders: Secondary | ICD-10-CM

## 2017-08-17 ENCOUNTER — Ambulatory Visit
Admission: RE | Admit: 2017-08-17 | Discharge: 2017-08-17 | Disposition: A | Discharge status: Home / Self Care | Payer: BLUE CROSS/BLUE SHIELD | Source: Ambulatory Visit | Attending: Nephrology | Admitting: Nephrology

## 2017-08-17 DIAGNOSIS — M7989 Other specified soft tissue disorders: Secondary | ICD-10-CM

## 2017-12-19 ENCOUNTER — Other Ambulatory Visit: Payer: Self-pay | Admitting: Obstetrics and Gynecology

## 2017-12-19 DIAGNOSIS — Z1231 Encounter for screening mammogram for malignant neoplasm of breast: Secondary | ICD-10-CM

## 2017-12-21 ENCOUNTER — Other Ambulatory Visit: Payer: Self-pay | Admitting: Surgery

## 2017-12-21 DIAGNOSIS — E042 Nontoxic multinodular goiter: Secondary | ICD-10-CM

## 2018-01-02 ENCOUNTER — Other Ambulatory Visit: Payer: BLUE CROSS/BLUE SHIELD

## 2018-01-09 ENCOUNTER — Ambulatory Visit
Admission: RE | Admit: 2018-01-09 | Discharge: 2018-01-09 | Disposition: A | Discharge status: Home / Self Care | Payer: BLUE CROSS/BLUE SHIELD | Source: Ambulatory Visit | Attending: Obstetrics and Gynecology | Admitting: Obstetrics and Gynecology

## 2018-01-09 DIAGNOSIS — Z1231 Encounter for screening mammogram for malignant neoplasm of breast: Secondary | ICD-10-CM

## 2018-01-11 ENCOUNTER — Ambulatory Visit
Admission: RE | Admit: 2018-01-11 | Discharge: 2018-01-11 | Disposition: A | Discharge status: Home / Self Care | Payer: BLUE CROSS/BLUE SHIELD | Source: Ambulatory Visit | Attending: Surgery | Admitting: Surgery

## 2018-01-11 DIAGNOSIS — E042 Nontoxic multinodular goiter: Secondary | ICD-10-CM

## 2019-04-18 ENCOUNTER — Other Ambulatory Visit: Payer: Self-pay | Admitting: Surgery

## 2019-04-18 DIAGNOSIS — E041 Nontoxic single thyroid nodule: Secondary | ICD-10-CM

## 2020-05-25 ENCOUNTER — Other Ambulatory Visit: Payer: Self-pay | Admitting: Family Medicine

## 2020-05-25 DIAGNOSIS — Z1231 Encounter for screening mammogram for malignant neoplasm of breast: Secondary | ICD-10-CM

## 2020-06-02 ENCOUNTER — Ambulatory Visit
Admission: RE | Admit: 2020-06-02 | Discharge: 2020-06-02 | Disposition: A | Discharge status: Home / Self Care | Payer: BLUE CROSS/BLUE SHIELD | Source: Ambulatory Visit | Attending: Family Medicine | Admitting: Family Medicine

## 2020-06-02 ENCOUNTER — Other Ambulatory Visit: Payer: Self-pay

## 2020-06-02 DIAGNOSIS — Z1231 Encounter for screening mammogram for malignant neoplasm of breast: Secondary | ICD-10-CM

## 2020-06-12 ENCOUNTER — Other Ambulatory Visit: Payer: Self-pay | Admitting: Surgery

## 2020-06-12 DIAGNOSIS — E041 Nontoxic single thyroid nodule: Secondary | ICD-10-CM

## 2021-01-14 ENCOUNTER — Other Ambulatory Visit: Payer: Self-pay

## 2021-01-14 ENCOUNTER — Emergency Department: Payer: Managed Care, Other (non HMO)

## 2021-01-14 ENCOUNTER — Inpatient Hospital Stay
Admission: EM | Admit: 2021-01-14 | Discharge: 2021-02-09 | DRG: 207 | Disposition: E | Payer: Managed Care, Other (non HMO) | Attending: Pulmonary Disease | Admitting: Pulmonary Disease

## 2021-01-14 DIAGNOSIS — Z90710 Acquired absence of both cervix and uterus: Secondary | ICD-10-CM

## 2021-01-14 DIAGNOSIS — D6489 Other specified anemias: Secondary | ICD-10-CM | POA: Diagnosis present

## 2021-01-14 DIAGNOSIS — N170 Acute kidney failure with tubular necrosis: Secondary | ICD-10-CM | POA: Diagnosis present

## 2021-01-14 DIAGNOSIS — E669 Obesity, unspecified: Secondary | ICD-10-CM | POA: Diagnosis not present

## 2021-01-14 DIAGNOSIS — R5081 Fever presenting with conditions classified elsewhere: Secondary | ICD-10-CM | POA: Diagnosis not present

## 2021-01-14 DIAGNOSIS — Z7189 Other specified counseling: Secondary | ICD-10-CM | POA: Diagnosis not present

## 2021-01-14 DIAGNOSIS — U071 COVID-19: Secondary | ICD-10-CM | POA: Diagnosis present

## 2021-01-14 DIAGNOSIS — Z79899 Other long term (current) drug therapy: Secondary | ICD-10-CM | POA: Diagnosis not present

## 2021-01-14 DIAGNOSIS — J9601 Acute respiratory failure with hypoxia: Secondary | ICD-10-CM

## 2021-01-14 DIAGNOSIS — Z978 Presence of other specified devices: Secondary | ICD-10-CM

## 2021-01-14 DIAGNOSIS — J80 Acute respiratory distress syndrome: Secondary | ICD-10-CM | POA: Diagnosis present

## 2021-01-14 DIAGNOSIS — G928 Other toxic encephalopathy: Secondary | ICD-10-CM | POA: Diagnosis not present

## 2021-01-14 DIAGNOSIS — Z6841 Body Mass Index (BMI) 40.0 and over, adult: Secondary | ICD-10-CM

## 2021-01-14 DIAGNOSIS — I110 Hypertensive heart disease with heart failure: Secondary | ICD-10-CM | POA: Diagnosis present

## 2021-01-14 DIAGNOSIS — Z7982 Long term (current) use of aspirin: Secondary | ICD-10-CM

## 2021-01-14 DIAGNOSIS — E875 Hyperkalemia: Secondary | ICD-10-CM | POA: Diagnosis not present

## 2021-01-14 DIAGNOSIS — I9589 Other hypotension: Secondary | ICD-10-CM | POA: Diagnosis not present

## 2021-01-14 DIAGNOSIS — E119 Type 2 diabetes mellitus without complications: Secondary | ICD-10-CM | POA: Diagnosis not present

## 2021-01-14 DIAGNOSIS — J1282 Pneumonia due to coronavirus disease 2019: Secondary | ICD-10-CM | POA: Diagnosis present

## 2021-01-14 DIAGNOSIS — N179 Acute kidney failure, unspecified: Secondary | ICD-10-CM | POA: Diagnosis not present

## 2021-01-14 DIAGNOSIS — Z515 Encounter for palliative care: Secondary | ICD-10-CM

## 2021-01-14 DIAGNOSIS — I4891 Unspecified atrial fibrillation: Secondary | ICD-10-CM | POA: Diagnosis present

## 2021-01-14 DIAGNOSIS — E1165 Type 2 diabetes mellitus with hyperglycemia: Secondary | ICD-10-CM | POA: Diagnosis present

## 2021-01-14 DIAGNOSIS — Z66 Do not resuscitate: Secondary | ICD-10-CM | POA: Diagnosis not present

## 2021-01-14 DIAGNOSIS — I5031 Acute diastolic (congestive) heart failure: Secondary | ICD-10-CM | POA: Diagnosis present

## 2021-01-14 DIAGNOSIS — J9600 Acute respiratory failure, unspecified whether with hypoxia or hypercapnia: Secondary | ICD-10-CM

## 2021-01-14 DIAGNOSIS — Z9289 Personal history of other medical treatment: Secondary | ICD-10-CM

## 2021-01-14 DIAGNOSIS — Z7984 Long term (current) use of oral hypoglycemic drugs: Secondary | ICD-10-CM | POA: Diagnosis not present

## 2021-01-14 DIAGNOSIS — J159 Unspecified bacterial pneumonia: Secondary | ICD-10-CM | POA: Diagnosis present

## 2021-01-14 DIAGNOSIS — Z4659 Encounter for fitting and adjustment of other gastrointestinal appliance and device: Secondary | ICD-10-CM

## 2021-01-14 LAB — COMPREHENSIVE METABOLIC PANEL
ALT: 25 U/L (ref 0–44)
AST: 40 U/L (ref 15–41)
Albumin: 2.8 g/dL — ABNORMAL LOW (ref 3.5–5.0)
Alkaline Phosphatase: 58 U/L (ref 38–126)
Anion gap: 13 (ref 5–15)
BUN: 19 mg/dL (ref 6–20)
CO2: 25 mmol/L (ref 22–32)
Calcium: 9.9 mg/dL (ref 8.9–10.3)
Chloride: 101 mmol/L (ref 98–111)
Creatinine, Ser: 1.03 mg/dL — ABNORMAL HIGH (ref 0.44–1.00)
GFR, Estimated: >60 mL/min (ref >60)
Glucose, Bld: 261 mg/dL — ABNORMAL HIGH (ref 70–99)
Potassium: 4.2 mmol/L (ref 3.5–5.1)
Sodium: 139 mmol/L (ref 135–145)
Total Bilirubin: 0.9 mg/dL (ref 0.3–1.2)
Total Protein: 7.2 g/dL (ref 6.5–8.1)

## 2021-01-14 LAB — CBC
HCT: 39.4 % (ref 36.0–46.0)
Hemoglobin: 12.1 g/dL (ref 12.0–15.0)
MCH: 26.7 pg (ref 26.0–34.0)
MCHC: 30.7 g/dL (ref 30.0–36.0)
MCV: 86.8 fL (ref 80.0–100.0)
Platelets: 284 10*3/uL (ref 150–400)
RBC: 4.54 MIL/uL (ref 3.87–5.11)
RDW: 14.6 % (ref 11.5–15.5)
WBC: 12.9 10*3/uL — ABNORMAL HIGH (ref 4.0–10.5)
nRBC: 0.2 % (ref 0.0–0.2)

## 2021-01-14 LAB — FERRITIN: Ferritin: 205 ng/mL (ref 11–307)

## 2021-01-14 LAB — FIBRIN DERIVATIVES D-DIMER (ARMC ONLY): Fibrin derivatives D-dimer (ARMC): 1156.25 ng/mL (FEU) — ABNORMAL HIGH (ref 0.00–499.00)

## 2021-01-14 LAB — HCG, QUANTITATIVE, PREGNANCY: hCG, Beta Chain, Quant, S: 3 m[IU]/mL (ref <5)

## 2021-01-14 LAB — TRIGLYCERIDES: Triglycerides: 155 mg/dL — ABNORMAL HIGH (ref <150)

## 2021-01-14 LAB — GLUCOSE, CAPILLARY
Glucose-Capillary: 255 mg/dL — ABNORMAL HIGH (ref 70–99)
Glucose-Capillary: 314 mg/dL — ABNORMAL HIGH (ref 70–99)

## 2021-01-14 LAB — FIBRINOGEN: Fibrinogen: 623 mg/dL — ABNORMAL HIGH (ref 210–475)

## 2021-01-14 LAB — PROCALCITONIN: Procalcitonin: 0.21 ng/mL

## 2021-01-14 LAB — TROPONIN I (HIGH SENSITIVITY)
Troponin I (High Sensitivity): 12 ng/L (ref <18)
Troponin I (High Sensitivity): 15 ng/L (ref <18)

## 2021-01-14 LAB — C-REACTIVE PROTEIN: CRP: 25.4 mg/dL — ABNORMAL HIGH (ref <1.0)

## 2021-01-14 LAB — HIV ANTIBODY (ROUTINE TESTING W REFLEX): HIV Screen 4th Generation wRfx: NONREACTIVE

## 2021-01-14 LAB — LACTIC ACID, PLASMA: Lactic Acid, Venous: 1.6 mmol/L (ref 0.5–1.9)

## 2021-01-14 LAB — HEMOGLOBIN A1C
Hgb A1c MFr Bld: 6.3 % — ABNORMAL HIGH (ref 4.8–5.6)
Mean Plasma Glucose: 134.11 mg/dL

## 2021-01-14 LAB — LACTATE DEHYDROGENASE: LDH: 229 U/L — ABNORMAL HIGH (ref 98–192)

## 2021-01-14 MED ORDER — ZINC SULFATE 220 (50 ZN) MG PO CAPS
220.0000 mg | ORAL_CAPSULE | Freq: Every day | ORAL | Status: DC
Start: 2021-01-14 — End: 2021-01-14

## 2021-01-14 MED ORDER — SODIUM CHLORIDE 0.9 % IV SOLN
200.0000 mg | Freq: Once | INTRAVENOUS | Status: AC
  Administered 2021-01-14: 200 mg via INTRAVENOUS
  Filled 2021-01-14: qty 200

## 2021-01-14 MED ORDER — ZINC SULFATE 220 (50 ZN) MG PO CAPS
220.0000 mg | ORAL_CAPSULE | Freq: Two times a day (BID) | ORAL | Status: DC
  Administered 2021-01-15: 220 mg via ORAL
  Filled 2021-01-14 (×3): qty 1

## 2021-01-14 MED ORDER — HEPARIN SODIUM (PORCINE) 5000 UNIT/ML IJ SOLN: 5000.0000 [IU] | Freq: Three times a day (TID) | INTRAMUSCULAR | Status: DC

## 2021-01-14 MED ORDER — SODIUM CHLORIDE 0.9% FLUSH
3.0000 mL | Freq: Two times a day (BID) | INTRAVENOUS | Status: DC
  Administered 2021-01-14 – 2021-02-01 (×24): 3 mL via INTRAVENOUS

## 2021-01-14 MED ORDER — SODIUM CHLORIDE 0.9 % IV SOLN
250.0000 mL | INTRAVENOUS | Status: DC | PRN
  Administered 2021-01-21 – 2021-01-27 (×3): 250 mL via INTRAVENOUS

## 2021-01-14 MED ORDER — GUAIFENESIN-DM 100-10 MG/5ML PO SYRP
5.0000 mL | ORAL_SOLUTION | ORAL | Status: DC | PRN
  Administered 2021-01-14: 5 mL via ORAL
  Filled 2021-01-14: qty 5

## 2021-01-14 MED ORDER — METHYLPREDNISOLONE SODIUM SUCC 125 MG IJ SOLR: 60.0000 mg | Freq: Two times a day (BID) | INTRAMUSCULAR | Status: DC

## 2021-01-14 MED ORDER — SODIUM CHLORIDE 0.9 % IV SOLN
100.0000 mg | Freq: Every day | INTRAVENOUS | Status: AC
  Administered 2021-01-15 – 2021-01-18 (×4): 100 mg via INTRAVENOUS
  Filled 2021-01-14: qty 20
  Filled 2021-01-14: qty 100
  Filled 2021-01-14 (×2): qty 20

## 2021-01-14 MED ORDER — METHYLPREDNISOLONE SODIUM SUCC 125 MG IJ SOLR
60.0000 mg | Freq: Two times a day (BID) | INTRAMUSCULAR | Status: DC
  Administered 2021-01-14 – 2021-01-15 (×3): 60 mg via INTRAVENOUS
  Filled 2021-01-14 (×3): qty 2

## 2021-01-14 MED ORDER — ENOXAPARIN SODIUM 100 MG/ML ~~LOC~~ SOLN
0.5000 mg/kg | SUBCUTANEOUS | Status: DC
  Administered 2021-01-14 – 2021-01-25 (×12): 95 mg via SUBCUTANEOUS
  Filled 2021-01-14 (×13): qty 1

## 2021-01-14 MED ORDER — LEVOFLOXACIN IN D5W 750 MG/150ML IV SOLN
750.0000 mg | INTRAVENOUS | Status: DC
  Administered 2021-01-14: 750 mg via INTRAVENOUS
  Filled 2021-01-14 (×2): qty 150

## 2021-01-14 MED ORDER — DOCUSATE SODIUM 100 MG PO CAPS: 100.0000 mg | ORAL_CAPSULE | Freq: Two times a day (BID) | ORAL | Status: DC | PRN

## 2021-01-14 MED ORDER — ASCORBIC ACID 500 MG PO TABS
500.0000 mg | ORAL_TABLET | Freq: Every day | ORAL | Status: DC
Start: 2021-01-14 — End: 2021-01-14

## 2021-01-14 MED ORDER — ALBUTEROL SULFATE HFA 108 (90 BASE) MCG/ACT IN AERS
2.0000 | INHALATION_SPRAY | RESPIRATORY_TRACT | Status: DC | PRN
  Administered 2021-01-14: 2 via RESPIRATORY_TRACT
  Filled 2021-01-14 (×2): qty 6.7

## 2021-01-14 MED ORDER — ACETAMINOPHEN 325 MG PO TABS: 650.0000 mg | ORAL_TABLET | ORAL | Status: DC | PRN

## 2021-01-14 MED ORDER — FAMOTIDINE IN NACL 20-0.9 MG/50ML-% IV SOLN
20.0000 mg | Freq: Two times a day (BID) | INTRAVENOUS | Status: DC
  Administered 2021-01-14 – 2021-01-26 (×25): 20 mg via INTRAVENOUS
  Filled 2021-01-14 (×26): qty 50

## 2021-01-14 MED ORDER — DEXAMETHASONE SODIUM PHOSPHATE 10 MG/ML IJ SOLN
6.0000 mg | Freq: Once | INTRAMUSCULAR | Status: AC
  Administered 2021-01-14: 6 mg via INTRAVENOUS
  Filled 2021-01-14: qty 1

## 2021-01-14 MED ORDER — ASCORBIC ACID 500 MG PO TABS
1000.0000 mg | ORAL_TABLET | Freq: Three times a day (TID) | ORAL | Status: DC
  Administered 2021-01-15 (×2): 1000 mg via ORAL
  Filled 2021-01-14 (×2): qty 2

## 2021-01-14 MED ORDER — INSULIN ASPART 100 UNIT/ML ~~LOC~~ SOLN
0.0000 [IU] | SUBCUTANEOUS | Status: DC
  Administered 2021-01-14 – 2021-01-15 (×5): 11 [IU] via SUBCUTANEOUS
  Administered 2021-01-15 (×2): 15 [IU] via SUBCUTANEOUS
  Administered 2021-01-15: 11 [IU] via SUBCUTANEOUS
  Administered 2021-01-16: 15 [IU] via SUBCUTANEOUS
  Administered 2021-01-16 (×5): 11 [IU] via SUBCUTANEOUS
  Administered 2021-01-17: 7 [IU] via SUBCUTANEOUS
  Administered 2021-01-17 (×2): 4 [IU] via SUBCUTANEOUS
  Administered 2021-01-17 (×3): 7 [IU] via SUBCUTANEOUS
  Administered 2021-01-18: 11 [IU] via SUBCUTANEOUS
  Administered 2021-01-18: 7 [IU] via SUBCUTANEOUS
  Administered 2021-01-18: 4 [IU] via SUBCUTANEOUS
  Administered 2021-01-18: 7 [IU] via SUBCUTANEOUS
  Administered 2021-01-18 – 2021-01-19 (×4): 4 [IU] via SUBCUTANEOUS
  Administered 2021-01-19 – 2021-01-20 (×6): 7 [IU] via SUBCUTANEOUS
  Administered 2021-01-20: 11 [IU] via SUBCUTANEOUS
  Administered 2021-01-20: 4 [IU] via SUBCUTANEOUS
  Administered 2021-01-20 (×2): 7 [IU] via SUBCUTANEOUS
  Administered 2021-01-21 (×2): 11 [IU] via SUBCUTANEOUS
  Administered 2021-01-21: 15 [IU] via SUBCUTANEOUS
  Administered 2021-01-21 (×2): 11 [IU] via SUBCUTANEOUS
  Administered 2021-01-21 (×2): 7 [IU] via SUBCUTANEOUS
  Administered 2021-01-22 (×2): 4 [IU] via SUBCUTANEOUS
  Administered 2021-01-22: 11 [IU] via SUBCUTANEOUS
  Administered 2021-01-22: 7 [IU] via SUBCUTANEOUS
  Administered 2021-01-22 (×2): 4 [IU] via SUBCUTANEOUS
  Administered 2021-01-23: 7 [IU] via SUBCUTANEOUS
  Administered 2021-01-23 (×4): 4 [IU] via SUBCUTANEOUS
  Administered 2021-01-24: 3 [IU] via SUBCUTANEOUS
  Administered 2021-01-24: 7 [IU] via SUBCUTANEOUS
  Administered 2021-01-24: 4 [IU] via SUBCUTANEOUS
  Administered 2021-01-24: 7 [IU] via SUBCUTANEOUS
  Administered 2021-01-24: 11 [IU] via SUBCUTANEOUS
  Administered 2021-01-24 – 2021-01-25 (×2): 7 [IU] via SUBCUTANEOUS
  Administered 2021-01-25: 3 [IU] via SUBCUTANEOUS
  Administered 2021-01-25 (×2): 4 [IU] via SUBCUTANEOUS
  Administered 2021-01-25 (×2): 7 [IU] via SUBCUTANEOUS
  Administered 2021-01-26: 4 [IU] via SUBCUTANEOUS
  Administered 2021-01-26: 7 [IU] via SUBCUTANEOUS
  Administered 2021-01-26: 4 [IU] via SUBCUTANEOUS
  Administered 2021-01-26: 7 [IU] via SUBCUTANEOUS
  Administered 2021-01-26: 3 [IU] via SUBCUTANEOUS
  Administered 2021-01-26: 4 [IU] via SUBCUTANEOUS
  Administered 2021-01-26: 3 [IU] via SUBCUTANEOUS
  Administered 2021-01-27 (×2): 7 [IU] via SUBCUTANEOUS
  Administered 2021-01-27: 4 [IU] via SUBCUTANEOUS
  Administered 2021-01-27: 7 [IU] via SUBCUTANEOUS
  Administered 2021-01-28: 4 [IU] via SUBCUTANEOUS
  Administered 2021-01-28: 7 [IU] via SUBCUTANEOUS
  Administered 2021-01-28: 4 [IU] via SUBCUTANEOUS
  Administered 2021-01-28: 7 [IU] via SUBCUTANEOUS
  Administered 2021-01-28 (×2): 4 [IU] via SUBCUTANEOUS
  Administered 2021-01-28: 7 [IU] via SUBCUTANEOUS
  Administered 2021-01-29 (×2): 4 [IU] via SUBCUTANEOUS
  Administered 2021-01-29: 3 [IU] via SUBCUTANEOUS
  Administered 2021-01-29 – 2021-01-30 (×3): 4 [IU] via SUBCUTANEOUS
  Administered 2021-01-30: 7 [IU] via SUBCUTANEOUS
  Administered 2021-01-30: 4 [IU] via SUBCUTANEOUS
  Administered 2021-01-30 – 2021-01-31 (×2): 7 [IU] via SUBCUTANEOUS
  Administered 2021-01-31: 4 [IU] via SUBCUTANEOUS
  Administered 2021-01-31: 7 [IU] via SUBCUTANEOUS
  Administered 2021-01-31 (×3): 4 [IU] via SUBCUTANEOUS
  Administered 2021-01-31 – 2021-02-01 (×4): 7 [IU] via SUBCUTANEOUS
  Filled 2021-01-14 (×101): qty 1

## 2021-01-14 MED ORDER — ONDANSETRON HCL 4 MG/2ML IJ SOLN: 4.0000 mg | Freq: Four times a day (QID) | INTRAMUSCULAR | Status: DC | PRN

## 2021-01-14 MED ORDER — POLYETHYLENE GLYCOL 3350 17 G PO PACK: 17.0000 g | PACK | Freq: Every day | ORAL | Status: DC | PRN

## 2021-01-14 MED ORDER — SODIUM CHLORIDE 0.9% FLUSH
3.0000 mL | INTRAVENOUS | Status: DC | PRN
  Administered 2021-01-16: 3 mL via INTRAVENOUS

## 2021-01-14 NOTE — H&P (Signed)
NAME:  Dana Bruce, MRN:  [id number], DOB:  [birth year], LOS: 0 ADMISSION DATE:  01/22/2021, CONSULTATION DATE:  01/20/2021 REFERRING MD:  Dr. Fuller Plan,  CHIEF COMPLAINT: Shortness of breath  Brief History:  55 year old unvaccinated female admitted with acute hypoxic respiratory failure in the setting of COVID-19 pneumonia  History of Present Illness:  Dana Bruce is a 55 year old female with a past medical history as listed below who presents to [facility] ED on 01/19/2021 with complaints of shortness of breath.  She reports she began having Covid-like symptoms (malaise, body aches shortness of breath,cough) last Friday 01/08/21.  She tested positive on 01/10/2021.  She reports that her shortness of breath has worsened over the past several days.  She denied chest pain, abdominal pain, vomiting, dysuria, palpitations, dizziness, edema. She is unvaccinated for COVID-19.  ED course: Upon presentation to the ED she was noted to be severely hypoxic with O2 saturations 50% on room air, tachypneic, and tachycardic.  Vitals included: Temperature 97.4 F, respiratory rate 20, pulse 110, blood pressure 124/85.  She was placed on 100% FiO2 via heated 5 flow nasal cannula at 35 L with improvement in O2 saturations.  Work-up reveals glucose 261, BUN 19, creatinine 1.03, LDH 229, high-sensitivity troponin 12, ferritin 205, WBC 12.9, lactic acid 1.6, procalcitonin 0.21, fibrinogen 623, fibrin derivative products 1156.  Chest x-ray shows diffuse bilateral groundglass opacities consistent with COVID-19 pneumonia. She meets SIRS criteria given her tachycardia, tachypnea, and WBC of 12.9.  PCCM is asked to admit the patient to ICU for further work-up and treatment of acute hypoxic respiratory failure in the setting of COVID-19 pneumonia.  She is high risk for decompensation and  need for intubation.  Past Medical History:  Hypertension Diabetes mellitus Arthritis Goiter IBS  Significant Hospital Events:  2/3: Admission  to stepdown  Consults:  PCCM  Procedures:  N/A  Significant Diagnostic Tests:  2/3: Chest x-ray>>IMPRESSION: Diffuse bilateral ground-glass airspace opacities consistent with the patient's history of viral pneumonia.  Micro Data:  1/30: SARS-CoV-2 PCR>> positive 2/3: Blood culture x2>> 2/3: Sputum>> 2/3: Strep pneumo urinary antigen>> 2/3: Legionella urinary antigen>>  Antimicrobials:  Remdesivir 2/3>> Levaquin 2/3>>  Interim History / Subjective:  -Patient currently on nonrebreather mask + 100% FiO2 heated high flow nasal cannula with O2 saturations 90 to 93%, mild tachypnea -Hemodynamically stable -Patient reports shortness of breath and cough (intermittently productive of yellowish-gray sputum), along with intermittent nausea and fatigue -Denies chest pain, fever, chills, abdominal pain, dysuria, dizziness  Objective   Blood pressure 139/63, pulse (!) 109, temperature (!) 97.4 F (36.3 C), resp. rate (!) 22, height 5\' 7"  (1.702 m), weight (!) 187.8 kg, SpO2 95 %.    FiO2 (%):  [100 %] 100 %  No intake or output data in the 24 hours ending 01/16/2021 1705 Filed Weights   02/05/2021 1441 01/31/2021 1508  Weight: (!) 204.1 kg (!) 187.8 kg    Examination: General: Acutely ill-appearing obese female, sitting in bed, on NRB and HHFNC, mild tachypnea but no acute distress HENT: Atraumatic, normocephalic, neck supple, no JVD Lungs: Clear diminished breath sounds bilaterally, mild tachypnea, even Cardiovascular: Tachycardia, regular rhythm, S1-S2, good peripheral circulation Abdomen: Obese, soft, nontender, nondistended, no guarding or rebound tenderness, bowel sounds positive x4 Extremities: No deformities, no edema Neuro: Awake, alert and oriented x4, follows commands, no focal deficits, speech clear GU: Deferred Skin: Warm and dry.  No obvious rashes, lesions, ulcerations  Resolved Hospital Problem list   N/A  Assessment & Plan:  Acute Hypoxic Respiratory Failure in  the setting of COVID-19 Pneumonia -Supplemental O2 as needed to maintain O2 saturations greater than 88% -Currently on NRB + HHFNC 100% FiO2 -High risk for intubation -Follow intermittent chest x-ray and ABG as needed -Remdesivir, plan for 5 days -Steroids (Solu-Medrol 60 mg twice daily) -Follow inflammatory markers: Ferritin, D-dimer, CRP -Assess for possible tocilizumab  -Vitamin C, zinc -Plan to repeat and check resp cultures -Antitussives -Bronchodilators PRN via MDI -Maintain euvolemia to net negative fluid balance as able -Self proning as tolerated -Incentive spirometry and flutter valve -Maintain airborne and contact precautions   Sepsis secondary to COVID-19 Pneumonia ? Superimposed Bacterial Pneumonia -Monitor fever curve -Trend WBCs and procalcitonin -Follow cultures as above -Place on empiric Levaquin for now (discussed with Dr. Belia Heman) -Lactic acid 1.6   Diabetes mellitus -CBGs -Sliding scale insulin -Follow ICU hypo/hyperglycemia protocol   Best practice (evaluated daily)  Diet: NPO for now given respiratory status Pain/Anxiety/Delirium protocol (if indicated): N/A VAP protocol (if indicated): N/A DVT prophylaxis: Lovenox subcu GI prophylaxis: Pepcid Glucose control: Sliding scale insulin Mobility: As tolerated Disposition: ICU  Goals of Care:  Last date of multidisciplinary goals of care discussion: N/A Family and staff present: Updated patient at bedside 01/31/2021 Summary of discussion: Treating Covid with remdesivir, steroids, empiric antibiotics for now Follow up goals of care discussion due: 01/15/2021 Code Status: Full code  Labs   CBC: Recent Labs  Lab 01/20/2021 1441  WBC 12.9*  HGB 12.1  HCT 39.4  MCV 86.8  PLT 284    Basic Metabolic Panel: Recent Labs  Lab 02/07/2021 1457  NA 139  K 4.2  CL 101  CO2 25  GLUCOSE 261*  BUN 19  CREATININE 1.03*  CALCIUM 9.9   GFR: Estimated Creatinine Clearance: 109.2 mL/min (A) (by C-G formula  based on SCr of 1.03 mg/dL (H)). Recent Labs  Lab 01/28/2021 1441 01/28/2021 1457  PROCALCITON  --  0.21  WBC 12.9*  --   LATICACIDVEN 1.6  --     Liver Function Tests: Recent Labs  Lab 01/25/2021 1457  AST 40  ALT 25  ALKPHOS 58  BILITOT 0.9  PROT 7.2  ALBUMIN 2.8*   No results for input(s): LIPASE, AMYLASE in the last 168 hours. No results for input(s): AMMONIA in the last 168 hours.  ABG No results found for: PHART, PCO2ART, PO2ART, HCO3, TCO2, ACIDBASEDEF, O2SAT   Coagulation Profile: No results for input(s): INR, PROTIME in the last 168 hours.  Cardiac Enzymes: No results for input(s): CKTOTAL, CKMB, CKMBINDEX, TROPONINI in the last 168 hours.  HbA1C: No results found for: HGBA1C  CBG: No results for input(s): GLUCAP in the last 168 hours.  Review of Systems:   Positives in BOLD: Gen: Denies fever, chills, weight change, +fatigue, night sweats HEENT: Denies blurred vision, double vision, hearing loss, tinnitus, sinus congestion, rhinorrhea, sore throat, neck stiffness, dysphagia PULM: Denies +shortness of breath, +cough, +sputum production, hemoptysis, wheezing CV: Denies chest pain, edema, orthopnea, paroxysmal nocturnal dyspnea, palpitations GI: Denies abdominal pain, +nausea, vomiting, diarrhea, hematochezia, melena, constipation, change in bowel habits GU: Denies dysuria, hematuria, polyuria, oliguria, urethral discharge Endocrine: Denies hot or cold intolerance, polyuria, polyphagia or appetite change Derm: Denies rash, dry skin, scaling or peeling skin change Heme: Denies easy bruising, bleeding, bleeding gums Neuro: Denies headache, numbness, weakness, slurred speech, loss of memory or consciousness   Past Medical History:  She,  has a past medical history of Arthritis, Diabetes mellitus without complication (HCC), Family history of  adverse reaction to anesthesia, Goiter, Hypertension, IBS (irritable bowel syndrome), and PONV (postoperative nausea and  vomiting).   Surgical History:   Past Surgical History:  Procedure Laterality Date  . ABDOMINAL HYSTERECTOMY     partial  . CHOLECYSTECTOMY    . COLONOSCOPY WITH PROPOFOL N/A 05/17/2017   Procedure: COLONOSCOPY WITH PROPOFOL;  Surgeon: Willis Modena, MD;  Location: [facility];  Service: Endoscopy;  Laterality: N/A;  . colonscopy  1995 or 1996   with barium enema  . DENTAL SURGERY    . DILATION AND CURETTAGE OF UTERUS     x 2  . surgery for ecotic pregnancy       Social History:   reports that she has never smoked. She has never used smokeless tobacco. She reports that she does not drink alcohol and does not use drugs.   Family History:  Her family history is not on file.   Allergies Allergies  Allergen Reactions  . Lasix [Furosemide] Itching  . Penicillins Hives and Itching    Has patient had a PCN reaction causing immediate rash, facial/tongue/throat swelling, SOB or lightheadedness with hypotension: yes Has patient had a PCN reaction causing severe rash involving mucus membranes or skin necrosis: no Has patient had a PCN reaction that required hospitalization No Has patient had a PCN reaction occurring within the last 10 years: No If all of the above answers are "NO", then may proceed with Cephalosporin use.   Rolland Porter Flavor [Flavoring Agent] Itching, Nausea And Vomiting and Rash    Breaks out in places where she touches strawberries      Home Medications  Prior to Admission medications   Medication Sig Start Date End Date Taking? Authorizing Provider  amLODipine (NORVASC) 10 MG tablet Take 10 mg by mouth daily.  01/29/14   [provider]  aspirin 81 MG tablet Take 81 mg by mouth daily.    [provider]  cetirizine (ZYRTEC) 10 MG tablet Take 10 mg by mouth at bedtime.    [provider]  fenofibrate (TRICOR) 145 MG tablet Take 145 mg by mouth every evening.    [provider]  fluticasone (FLONASE) 50 MCG/ACT nasal spray  Place 2 sprays into both nostrils at bedtime. 01/18/17   [provider]  Glucos-Chond-Hyal Ac-Ca Fructo (MOVE FREE JOINT HEALTH ADVANCE) TABS Take 1 tablet by mouth 2 (two) times daily.    [provider]  hyoscyamine (LEVSIN SL) 0.125 MG SL tablet Take 0.25 mg by mouth 3 (three) times daily as needed.  01/29/14   [provider]  Lactobacillus (ACIDOPHILUS PO) Take 1 capsule by mouth daily.    [provider]  Menthol-Zinc Oxide (CALMOSEPTINE EX) Apply 1 application topically 4 (four) times daily as needed (diarrhea).    [provider]  metFORMIN (GLUCOPHAGE) 1000 MG tablet Take 1,000 mg by mouth 2 (two) times daily.  02/23/14   [provider]  metoprolol (LOPRESSOR) 100 MG tablet Take 100 mg by mouth 2 (two) times daily. 02/11/17   [provider]  naproxen sodium (ALEVE) 220 MG tablet Take 440 mg by mouth 2 (two) times daily with a meal.    [provider]  ONE TOUCH ULTRA TEST test strip  12/18/13   [provider]  pioglitazone (ACTOS) 30 MG tablet Take 30 mg by mouth daily.  02/23/14   [provider]  promethazine (PHENERGAN) 25 MG tablet Take 25 mg by mouth 2 (two) times daily as needed for nausea or  vomiting.  01/29/14   [provider]  simvastatin (ZOCOR) 20 MG tablet Take 20 mg by mouth at bedtime.  02/23/14   [provider]  TRULICITY 1.5 MG/0.5ML SOPN Inject 1.5 mg into the skin once a week. Sunday 01/18/17   [provider]  valsartan-hydrochlorothiazide (DIOVAN-HCT) 320-25 MG per tablet Take 1 tablet by mouth daily.  01/29/14   [provider]     Critical care time: 50 minutes    Harlon Ditty, [facility] [place] [facility] Pager: [phone]

## 2021-01-14 NOTE — ED Provider Notes (Signed)
[facility] Emergency Department Provider Note  ____________________________________________   Event Date/Time   First MD Initiated Contact with Patient January 29, 2021 1452     (approximate)  I have reviewed the triage vital signs    HISTORY  Chief Complaint Shortness of Breath    HPI Dana Bruce is a 55 y.o. female with diabetes who comes in with shortness of breath.  Patient states that she started having symptoms on Friday, 6 days ago.  She had a positive Covid test on 01/10/2021.  Patient states that she came in for worsening shortness of breath that was severe, constant, nothing made it better, nothing made it worse.  Initial room air saturations were 50%.  Patient is on heated high flow nasal cannula with sats in the 90s.  Patient does report a cough and pain when she is coughing but no other chest pain, abdominal pain or leg swelling.            Past Medical History:  Diagnosis Date  . Arthritis    hips  . Diabetes mellitus without complication (HCC)    type 2  . Family history of adverse reaction to anesthesia    mother has ponv  . Goiter    followed by dr gerkin q year  . Hypertension   . IBS (irritable bowel syndrome)   . PONV (postoperative nausea and vomiting)    ponv 1 day after last dental surgery    Patient Active Problem List   Diagnosis Date Noted  . Right thyroid nodule, 2.9 cm 03/24/2014  . Thyroid goiter 03/24/2014    Past Surgical History:  Procedure Laterality Date  . ABDOMINAL HYSTERECTOMY     partial  . CHOLECYSTECTOMY    . COLONOSCOPY WITH PROPOFOL N/A 05/17/2017   Procedure: COLONOSCOPY WITH PROPOFOL;  Surgeon: Willis Modena, MD;  Location: [facility]OSCOPY;  Service: Endoscopy;  Laterality: N/A;  . colonscopy  1995 or 1996   with barium enema  . DENTAL SURGERY    . DILATION AND CURETTAGE OF UTERUS     x 2  . surgery for ecotic pregnancy      Prior to Admission medications   Medication Sig Start Date End  Date Taking? Authorizing Provider  amLODipine (NORVASC) 10 MG tablet Take 10 mg by mouth daily.  01/29/14   [provider]  aspirin 81 MG tablet Take 81 mg by mouth daily.    [provider]  cetirizine (ZYRTEC) 10 MG tablet Take 10 mg by mouth at bedtime.    [provider]  fenofibrate (TRICOR) 145 MG tablet Take 145 mg by mouth every evening.    [provider]  fluticasone (FLONASE) 50 MCG/ACT nasal spray Place 2 sprays into both nostrils at bedtime. 01/18/17   [provider]  Glucos-Chond-Hyal Ac-Ca Fructo (MOVE FREE JOINT HEALTH ADVANCE) TABS Take 1 tablet by mouth 2 (two) times daily.    [provider]  hyoscyamine (LEVSIN SL) 0.125 MG SL tablet Take 0.25 mg by mouth 3 (three) times daily as needed.  01/29/14   [provider]  Lactobacillus (ACIDOPHILUS PO) Take 1 capsule by mouth daily.    [provider]  Menthol-Zinc Oxide (CALMOSEPTINE EX) Apply 1 application topically 4 (four) times daily as needed (diarrhea).    [provider]  metFORMIN (GLUCOPHAGE) 1000 MG tablet Take 1,000 mg by mouth 2 (two) times daily.  02/23/14   [provider]  metoprolol (LOPRESSOR) 100 MG tablet Take 100 mg by  mouth 2 (two) times daily. 02/11/17   [provider]  naproxen sodium (ALEVE) 220 MG tablet Take 440 mg by mouth 2 (two) times daily with a meal.    [provider]  ONE TOUCH ULTRA TEST test strip  12/18/13   [provider]  pioglitazone (ACTOS) 30 MG tablet Take 30 mg by mouth daily.  02/23/14   [provider]  promethazine (PHENERGAN) 25 MG tablet Take 25 mg by mouth 2 (two) times daily as needed for nausea or vomiting.  01/29/14   [provider]  simvastatin (ZOCOR) 20 MG tablet Take 20 mg by mouth at bedtime.  02/23/14   [provider]  TRULICITY 1.5 MG/0.5ML SOPN Inject 1.5 mg into the skin once a week. Sunday 01/18/17   [provider]   valsartan-hydrochlorothiazide (DIOVAN-HCT) 320-25 MG per tablet Take 1 tablet by mouth daily.  01/29/14   [provider]    Allergies Lasix [furosemide], Penicillins, and Strawberry flavor [flavoring agent]  No family history on file.  Social History Social History   Tobacco Use  . Smoking status: Never Smoker  . Smokeless tobacco: Never Used  Vaping Use  . Vaping Use: Never used  Substance Use Topics  . Alcohol use: No  . Drug use: No      Review of Systems Constitutional: No fever/chills Eyes: No visual changes. ENT: No sore throat. Cardiovascular: Chest pain with coughing Respiratory: + SOB, cough Gastrointestinal: No abdominal pain.  No nausea, no vomiting.  No diarrhea.  No constipation. Genitourinary: Negative for dysuria. Musculoskeletal: Negative for back pain. Skin: Negative for rash. Neurological: Negative for headaches, focal weakness or numbness. All other ROS negative ____________________________________________   PHYSICAL EXAM:  VITAL SIGNS: ED Triage Vitals  Enc Vitals Group     BP 01-16-21 1450 124/85     Pulse Rate 2021-01-16 1440 (!) 120     Resp Jan 16, 2021 1440 (!) 36     Temp 01/16/2021 1440 (!) 97.4 F (36.3 C)     Temp src --      SpO2 2021/01/16 1437 (!) 55 %     Weight January 16, 2021 1441 (!) 450 lb (204.1 kg)     Height 01/16/21 1441 5\' 7"  (1.702 m)     Head Circumference --      Peak Flow --      Pain Score 2021/01/16 1437 5     Pain Loc --      Pain Edu? --      Excl. in GC? --     Constitutional: Alert and oriented.  Eyes: Conjunctivae are normal. EOMI. Head: Atraumatic. Nose: No congestion/rhinnorhea. Mouth/Throat: Mucous membranes are moist.   Neck: No stridor. Trachea Midline. FROM Cardiovascular: Tachycardic, regular rhythm.  Good peripheral circulation. Respiratory: no audible stridor, increased work of breathing  Gastrointestinal: Soft and nontender. No distention. Musculoskeletal: No lower extremity tenderness nor  edema.  No joint effusions. Neurologic:  Normal speech and language. No gross focal neurologic deficits are appreciated.  Skin:  Skin is warm, dry and intact. No rash noted. Psychiatric: Mood and affect are normal. Speech and behavior are normal. GU: Deferred   ____________________________________________   LABS (all labs ordered are listed, but only abnormal results are displayed)  Labs Reviewed  CBC - Abnormal; Notable for the following components:      Result Value   WBC 12.9 (*)    All other components within normal limits  COMPREHENSIVE METABOLIC PANEL - Abnormal; Notable for the following components:  Glucose, Bld 261 (*)    Creatinine, Ser 1.03 (*)    Albumin 2.8 (*)    All other components within normal limits  FIBRIN DERIVATIVES D-DIMER ([facility] ONLY) - Abnormal; Notable for the following components:   Fibrin derivatives D-dimer ([facility]) 8,295.62 (*)    All other components within normal limits  LACTATE DEHYDROGENASE - Abnormal; Notable for the following components:   LDH 229 (*)    All other components within normal limits  TRIGLYCERIDES - Abnormal; Notable for the following components:   Triglycerides 155 (*)    All other components within normal limits  FIBRINOGEN - Abnormal; Notable for the following components:   Fibrinogen 623 (*)    All other components within normal limits  CULTURE, BLOOD (ROUTINE X 2)  CULTURE, BLOOD (ROUTINE X 2)  LACTIC ACID, PLASMA  PROCALCITONIN  FERRITIN  C-REACTIVE PROTEIN  HCG, QUANTITATIVE, PREGNANCY  HIV ANTIBODY (ROUTINE TESTING W REFLEX)  CBC  CREATININE, SERUM  TROPONIN I (HIGH SENSITIVITY)  TROPONIN I (HIGH SENSITIVITY)   ____________________________________________   ED ECG REPORT I, Concha Se, the attending physician, personally viewed and interpreted this ECG.  Sinus tachycardia rate of 113, no ST elevation, no T wave version, normal intervals ____________________________________________  RADIOLOGY Vela Prose, personally viewed and evaluated these images (plain radiographs) as part of my medical decision making, as well as reviewing the written report by the radiologist.  ED MD interpretation: Diffuse bilateral opacifications consistent with her known Covid pneumonia  Official radiology report(s): DG Chest 1 View  Result Date: 10-Feb-2021 CLINICAL DATA:  Shortness of breath. EXAM: CHEST  1 VIEW COMPARISON:  November 18, 2016 FINDINGS: There are diffuse bilateral ground-glass airspace opacities. No pneumothorax. No definite large pleural effusion. The heart size is stable. Aortic calcifications are suspected. There is no acute osseous abnormality. IMPRESSION: Diffuse bilateral ground-glass airspace opacities consistent with the patient's history of viral pneumonia. Electronically Signed   By: Katherine Mantle M.D.   On: 02/10/21 15:15    ____________________________________________   PROCEDURES  Procedure(s) performed (including Critical Care):  .Critical Care Performed by: Concha Se, MD Authorized by: Concha Se, MD   Critical care provider statement:    Critical care time (minutes):  45   Critical care was necessary to treat or prevent imminent or life-threatening deterioration of the following conditions:  Respiratory failure   Critical care was time spent personally by me on the following activities:  Discussions with consultants, evaluation of patient's response to treatment, examination of patient, ordering and performing treatments and interventions, ordering and review of laboratory studies, ordering and review of radiographic studies, pulse oximetry, re-evaluation of patient's condition, obtaining history from patient or surrogate and review of old charts .1-3 Lead EKG Interpretation Performed by: Concha Se, MD Authorized by: Concha Se, MD     Interpretation: abnormal     ECG rate:  100s    ECG rate assessment: tachycardic     Rhythm: sinus tachycardia      Ectopy: none     Conduction: normal       ____________________________________________   INITIAL IMPRESSION / ASSESSMENT AND PLAN / ED COURSE  Dana Bruce was evaluated in Emergency Department on February 10, 2021 for the symptoms described in the history of present illness. She was evaluated in the context of the global COVID-19 pandemic, which necessitated consideration that the patient might be at risk for infection with the SARS-CoV-2 virus that causes COVID-19. Institutional protocols and algorithms that  pertain to the evaluation of patients at risk for COVID-19 are in a state of rapid change based on information released by regulatory bodies including the CDC and federal and state organizations. These policies and algorithms were followed during the patient's care in the ED.     Pt presents with SOB in the setting of known Covid. Will obtain testing and get xray to evaluate for PNA, EKG/trop to evaluate for ACS. No H/o Heart Failure.  Consider PE though this time I suspect that it is mostly just for Covid pneumonia.  Will get labs to evaluate for dehydration and electrolyte abnormalities.  Will continue to closely monitor patient during workup and keep on cardiac monitor. Will continue oxygen support as needed.   Patient is on 100% high flow nasal cannula at 35 L.  Patient is willing to be intubated if necessary.  At this time I have contacted the ICU team Dr. Belia Heman for admission due to her significant oxygen requirement.  Will start on steroids and remdesivir.  Will get labs evaluate for Electra abnormalities, AKI.    ____________________________________________   FINAL CLINICAL IMPRESSION(S) / ED DIAGNOSES   Final diagnoses:  Pneumonia due to COVID-19 virus  Acute respiratory failure with hypoxia (HCC)      MEDICATIONS GIVEN DURING THIS VISIT:  Medications  albuterol (VENTOLIN HFA) 108 (90 Base) MCG/ACT inhaler 2 puff (has no administration in time range)  remdesivir 200 mg in  sodium chloride 0.9% 250 mL IVPB (has no administration in time range)    Followed by  remdesivir 100 mg in sodium chloride 0.9 % 100 mL IVPB (has no administration in time range)  methylPREDNISolone sodium succinate (SOLU-MEDROL) 125 mg/2 mL injection 60 mg (has no administration in time range)  ascorbic acid (VITAMIN C) tablet 500 mg (has no administration in time range)  zinc sulfate capsule 220 mg (has no administration in time range)  dexamethasone (DECADRON) injection 6 mg (6 mg Intravenous Given 02/04/2021 1506)     ED Discharge Orders    None       Note:  This document was prepared using Dragon voice recognition software and may include unintentional dictation errors.   Concha Se, MD 01/21/2021 [phone]

## 2021-01-14 NOTE — ED Triage Notes (Addendum)
Pt comes via POV from home with c/o SOB since last Friday. Pt states she was dx with COVID on Sunday. Pt states increased SOB.  Current O2 at 52%RA. HR-116 Pt placed on NRB at this time.  Pt states some productive cough and pain in chest from that.

## 2021-01-14 NOTE — Progress Notes (Signed)
1810: Received patient from the ED via stretcher. She;s on HF 100% 50L and non- rebreather satting 87-91%.   She is alert oriented x4. No complaints of pain at this time.   Placed a purewick on her. She said she has some incontinence and leaks sometimes. Skin in intact.   Asked for some sips of water cause her mouth is dry.   Shift change report given to Ty RN.

## 2021-01-14 NOTE — Consult Note (Signed)
Remdesivir - Pharmacy Brief Note   O:  ALT: CMP pending CXR: pending SpO2: Hypoxic requiring supplemental oxygen   A/P:  01/10/21 SARS-CoV-2 NAAT (+)  Remdesivir 200 mg IVPB once followed by 100 mg IVPB daily x 4 days.   Dana Bruce Jan 23, 2021 3:01 PM

## 2021-01-14 NOTE — ED Notes (Addendum)
Pt placed on NRB with O2 improved to 87% at this time.

## 2021-01-15 ENCOUNTER — Inpatient Hospital Stay: Payer: Managed Care, Other (non HMO)

## 2021-01-15 ENCOUNTER — Inpatient Hospital Stay: Payer: Self-pay

## 2021-01-15 LAB — BLOOD GAS, VENOUS
Acid-base deficit: 0.8 mmol/L (ref 0.0–2.0)
Bicarbonate: 29.4 mmol/L — ABNORMAL HIGH (ref 20.0–28.0)
FIO2: 0.9
MECHVT: 500 mL
O2 Saturation: 99.1 %
PEEP: 10 cmH2O
Patient temperature: 37
RATE: 16 resp/min
pCO2, Ven: 77 mmHg (ref 44.0–60.0)
pH, Ven: 7.19 — CL (ref 7.250–7.430)
pO2, Ven: 163 mmHg — ABNORMAL HIGH (ref 32.0–45.0)

## 2021-01-15 LAB — COMPREHENSIVE METABOLIC PANEL
ALT: 22 U/L (ref 0–44)
AST: 33 U/L (ref 15–41)
Albumin: 2.4 g/dL — ABNORMAL LOW (ref 3.5–5.0)
Alkaline Phosphatase: 60 U/L (ref 38–126)
Anion gap: 15 (ref 5–15)
BUN: 18 mg/dL (ref 6–20)
CO2: 24 mmol/L (ref 22–32)
Calcium: 9.3 mg/dL (ref 8.9–10.3)
Chloride: 99 mmol/L (ref 98–111)
Creatinine, Ser: 1.01 mg/dL — ABNORMAL HIGH (ref 0.44–1.00)
GFR, Estimated: >60 mL/min (ref >60)
Glucose, Bld: 330 mg/dL — ABNORMAL HIGH (ref 70–99)
Potassium: 4.3 mmol/L (ref 3.5–5.1)
Sodium: 138 mmol/L (ref 135–145)
Total Bilirubin: 1.1 mg/dL (ref 0.3–1.2)
Total Protein: 6.9 g/dL (ref 6.5–8.1)

## 2021-01-15 LAB — CBC
HCT: 40 % (ref 36.0–46.0)
Hemoglobin: 12.5 g/dL (ref 12.0–15.0)
MCH: 26.8 pg (ref 26.0–34.0)
MCHC: 31.3 g/dL (ref 30.0–36.0)
MCV: 85.7 fL (ref 80.0–100.0)
Platelets: 285 10*3/uL (ref 150–400)
RBC: 4.67 MIL/uL (ref 3.87–5.11)
RDW: 14.6 % (ref 11.5–15.5)
WBC: 10 10*3/uL (ref 4.0–10.5)
nRBC: 0.2 % (ref 0.0–0.2)

## 2021-01-15 LAB — GLUCOSE, CAPILLARY
Glucose-Capillary: 267 mg/dL — ABNORMAL HIGH (ref 70–99)
Glucose-Capillary: 276 mg/dL — ABNORMAL HIGH (ref 70–99)
Glucose-Capillary: 284 mg/dL — ABNORMAL HIGH (ref 70–99)
Glucose-Capillary: 288 mg/dL — ABNORMAL HIGH (ref 70–99)
Glucose-Capillary: 296 mg/dL — ABNORMAL HIGH (ref 70–99)
Glucose-Capillary: 307 mg/dL — ABNORMAL HIGH (ref 70–99)
Glucose-Capillary: 331 mg/dL — ABNORMAL HIGH (ref 70–99)

## 2021-01-15 LAB — PROCALCITONIN: Procalcitonin: 0.15 ng/mL

## 2021-01-15 LAB — FERRITIN: Ferritin: 196 ng/mL (ref 11–307)

## 2021-01-15 LAB — C-REACTIVE PROTEIN: CRP: 24.8 mg/dL — ABNORMAL HIGH (ref <1.0)

## 2021-01-15 LAB — FIBRIN DERIVATIVES D-DIMER (ARMC ONLY): Fibrin derivatives D-dimer (ARMC): 1169.58 ng/mL (FEU) — ABNORMAL HIGH (ref 0.00–499.00)

## 2021-01-15 MED ORDER — LEVOFLOXACIN IN D5W 750 MG/150ML IV SOLN
750.0000 mg | INTRAVENOUS | Status: AC
  Administered 2021-01-15 – 2021-01-19 (×5): 750 mg via INTRAVENOUS
  Filled 2021-01-15 (×5): qty 150

## 2021-01-15 MED ORDER — FENTANYL CITRATE (PF) 100 MCG/2ML IJ SOLN
INTRAMUSCULAR | Status: AC
  Administered 2021-01-15: 200 ug via INTRAVENOUS
  Filled 2021-01-15: qty 4

## 2021-01-15 MED ORDER — MELATONIN 3 MG PO TABS: 3.0000 mg | ORAL_TABLET | Freq: Every day | ORAL | Status: DC

## 2021-01-15 MED ORDER — ASCORBIC ACID 500 MG PO TABS
1000.0000 mg | ORAL_TABLET | Freq: Three times a day (TID) | ORAL | Status: DC
  Administered 2021-01-16 – 2021-01-27 (×34): 1000 mg
  Filled 2021-01-15 (×34): qty 2

## 2021-01-15 MED ORDER — ROCURONIUM BROMIDE 50 MG/5ML IV SOLN: 50.0000 mg | Freq: Once | INTRAVENOUS | Status: AC

## 2021-01-15 MED ORDER — DOCUSATE SODIUM 50 MG/5ML PO LIQD
100.0000 mg | Freq: Two times a day (BID) | ORAL | Status: DC | PRN
  Administered 2021-01-16: 100 mg
  Filled 2021-01-15: qty 10

## 2021-01-15 MED ORDER — FUROSEMIDE 10 MG/ML IJ SOLN
40.0000 mg | Freq: Once | INTRAMUSCULAR | Status: AC
  Administered 2021-01-15: 40 mg via INTRAVENOUS

## 2021-01-15 MED ORDER — FENTANYL 2500MCG IN NS 250ML (10MCG/ML) PREMIX INFUSION
INTRAVENOUS | Status: AC
  Administered 2021-01-15: 100 ug/h via INTRAVENOUS
  Filled 2021-01-15: qty 250

## 2021-01-15 MED ORDER — FENTANYL 2500MCG IN NS 250ML (10MCG/ML) PREMIX INFUSION
0.0000 ug/h | INTRAVENOUS | Status: DC
  Administered 2021-01-16: 100 ug/h via INTRAVENOUS
  Administered 2021-01-16: 200 ug/h via INTRAVENOUS
  Administered 2021-01-17 – 2021-01-19 (×6): 250 ug/h via INTRAVENOUS
  Filled 2021-01-15 (×9): qty 250

## 2021-01-15 MED ORDER — LORAZEPAM 2 MG/ML IJ SOLN: 1.0000 mg | Freq: Once | INTRAMUSCULAR | Status: AC

## 2021-01-15 MED ORDER — LORAZEPAM 2 MG/ML IJ SOLN
INTRAMUSCULAR | Status: AC
  Administered 2021-01-15: 1 mg via INTRAVENOUS
  Filled 2021-01-15: qty 1

## 2021-01-15 MED ORDER — CHLORHEXIDINE GLUCONATE CLOTH 2 % EX PADS
6.0000 | MEDICATED_PAD | Freq: Every day | CUTANEOUS | Status: DC
  Administered 2021-01-15 – 2021-01-27 (×9): 6 via TOPICAL

## 2021-01-15 MED ORDER — DIPHENHYDRAMINE HCL 50 MG/ML IJ SOLN
25.0000 mg | Freq: Once | INTRAMUSCULAR | Status: AC
  Administered 2021-01-15: 25 mg via INTRAVENOUS

## 2021-01-15 MED ORDER — ACETAMINOPHEN 160 MG/5ML PO SOLN
650.0000 mg | ORAL | Status: DC | PRN
  Administered 2021-01-19 – 2021-02-01 (×10): 650 mg
  Filled 2021-01-15 (×14): qty 20.3

## 2021-01-15 MED ORDER — PROPOFOL 1000 MG/100ML IV EMUL
5.0000 ug/kg/min | INTRAVENOUS | Status: DC
  Administered 2021-01-15: 30 ug/kg/min via INTRAVENOUS
  Administered 2021-01-15: 20 ug/kg/min via INTRAVENOUS
  Administered 2021-01-15 (×2): 40 ug/kg/min via INTRAVENOUS
  Administered 2021-01-15: 30 ug/kg/min via INTRAVENOUS
  Administered 2021-01-16 (×2): 40 ug/kg/min via INTRAVENOUS
  Administered 2021-01-16: 50 ug/kg/min via INTRAVENOUS
  Administered 2021-01-16 (×2): 40 ug/kg/min via INTRAVENOUS
  Administered 2021-01-16 (×2): 50 ug/kg/min via INTRAVENOUS
  Administered 2021-01-16 – 2021-01-17 (×4): 40 ug/kg/min via INTRAVENOUS
  Administered 2021-01-17 – 2021-01-18 (×10): 50 ug/kg/min via INTRAVENOUS
  Administered 2021-01-18: 40 ug/kg/min via INTRAVENOUS
  Administered 2021-01-18: 50 ug/kg/min via INTRAVENOUS
  Administered 2021-01-18 (×2): 40 ug/kg/min via INTRAVENOUS
  Administered 2021-01-18: 50 ug/kg/min via INTRAVENOUS
  Administered 2021-01-18: 60 ug/kg/min via INTRAVENOUS
  Administered 2021-01-18: 50 ug/kg/min via INTRAVENOUS
  Administered 2021-01-18 (×2): 60 ug/kg/min via INTRAVENOUS
  Administered 2021-01-18 (×3): 50 ug/kg/min via INTRAVENOUS
  Administered 2021-01-19: 60 ug/kg/min via INTRAVENOUS
  Administered 2021-01-19: 50 ug/kg/min via INTRAVENOUS
  Administered 2021-01-19 (×3): 60 ug/kg/min via INTRAVENOUS
  Administered 2021-01-19: 40 ug/kg/min via INTRAVENOUS
  Administered 2021-01-19: 60 ug/kg/min via INTRAVENOUS
  Filled 2021-01-15 (×47): qty 100

## 2021-01-15 MED ORDER — INSULIN DETEMIR 100 UNIT/ML ~~LOC~~ SOLN
10.0000 [IU] | Freq: Every day | SUBCUTANEOUS | Status: DC
  Administered 2021-01-15: 10 [IU] via SUBCUTANEOUS
  Filled 2021-01-15 (×2): qty 0.1

## 2021-01-15 MED ORDER — FENTANYL CITRATE (PF) 100 MCG/2ML IJ SOLN: 200.0000 ug | Freq: Once | INTRAMUSCULAR | Status: AC

## 2021-01-15 MED ORDER — ROCURONIUM BROMIDE 50 MG/5ML IV SOLN
INTRAVENOUS | Status: AC
  Administered 2021-01-15: 50 mg via INTRAVENOUS
  Filled 2021-01-15: qty 1

## 2021-01-15 MED ORDER — MELATONIN 3 MG PO TABS
3.0000 mg | ORAL_TABLET | Freq: Every day | ORAL | Status: DC
  Filled 2021-01-15: qty 1

## 2021-01-15 MED ORDER — ZINC SULFATE 220 (50 ZN) MG PO CAPS: 220.0000 mg | ORAL_CAPSULE | Freq: Two times a day (BID) | ORAL | Status: DC

## 2021-01-15 MED ORDER — PROPOFOL 1000 MG/100ML IV EMUL
INTRAVENOUS | Status: AC
  Administered 2021-01-15: 40 ug/kg/min via INTRAVENOUS
  Filled 2021-01-15: qty 100

## 2021-01-15 NOTE — Progress Notes (Addendum)
Pt intubated due to increased WOB and hypoxia. Pt intubated by MD with RT at the bedside. No complications, positive color change noted and o2 sats WNL at this time.

## 2021-01-15 NOTE — Progress Notes (Signed)
Initial Nutrition Assessment  DOCUMENTATION CODES:   Morbid obesity  INTERVENTION:   Once tube feeds ready to be initiated, recommend:  Vital 1.5 @50ml /hr- Initiate at 1ml/hr and increase by 22ml/hr q 8 hours until goal rate is reached.   Pro-Source 3ml TID via tube, provides 40kcal and 11g of protein per serving   Free water flushes 69ml q4 hours to maintain tube patency   Propofol: 21.6 ml/hr- provides 570kcal/day  Regimen provides 2040kcal/day, 147g/day protein and 1034ml/day of free water (with propofol provides 2610kcal/day)  NUTRITION DIAGNOSIS:   Inadequate oral intake related to inability to eat (pt sedated and ventilated) as evidenced by NPO status.  GOAL:   Provide needs based on ASPEN/SCCM guidelines  MONITOR:   Vent status,Labs,Weight trends,Skin,I & O's  REASON FOR ASSESSMENT:   Ventilator    ASSESSMENT:   55 y/o female with h/o DM, HTN, IBS, DM, goiter and OSA who is admitted with COVID 19.   Pt sedated and ventilated. OGT in place. No plans to start tube feeds today.   Per chart, there is no recent documented weight history to determine if any significant weight changes.   Medications reviewed and include: vitamin C, lovenox, insulin, melatonin, solu-medrol, zinc, pepcid,  Labs reviewed: creat 1.01(H) cbgs- 276, 288, 307 x 24 hrs AIC 6.3(H)- 2/3  Patient is currently intubated on ventilator support MV: 7.6 L/min Temp (24hrs), Avg:98.5 F (36.9 C), Min:97.4 F (36.3 C), Max:99.3 F (37.4 C)  Propofol: 21.6 ml/hr- provides 570kcal/day   MAP- >32mmHg  UOP- 77m   NUTRITION - FOCUSED PHYSICAL EXAM:  Flowsheet Row Most Recent Value  Orbital Region No depletion  Upper Arm Region No depletion  Thoracic and Lumbar Region No depletion  Buccal Region No depletion  Temple Region No depletion  Clavicle Bone Region No depletion  Clavicle and Acromion Bone Region No depletion  Scapular Bone Region No depletion  Dorsal Hand No depletion   Patellar Region No depletion  Anterior Thigh Region No depletion  Posterior Calf Region No depletion  Edema (RD Assessment) None  Hair Reviewed  Eyes Reviewed  Mouth Reviewed  Skin Reviewed  Nails Reviewed     Diet Order:   Diet Order            Diet NPO time specified  Diet effective now                EDUCATION NEEDS:   No education needs have been identified at this time  Skin:  Skin Assessment: Reviewed RN Assessment  Last BM:  PTA  Height:   Ht Readings from Last 1 Encounters:  02/06/2021 5\' 7"  (1.702 m)    Weight:   Wt Readings from Last 1 Encounters:  01/15/21 (!) 179.6 kg    Ideal Body Weight:  61.36 kg  BMI:  Body mass index is 62.01 kg/m.  Estimated Nutritional Needs:   Kcal:  2173kcal/day  Protein:  125-150g/day  Fluid:  1.9-2.2L/day  MS, RD, LDN Please refer to [facility] for RD and/or RD on-call/weekend/after hours pager

## 2021-01-15 NOTE — Procedures (Signed)
Endotracheal Intubation: Patient required placement of an artificial airway secondary to Respiratory Failure  Consent: From patient at bedside while in MICU with progressive hypoxemia.  Witnessed by RN Margreta Journey and York Cerise  Hand washing performed prior to starting the procedure.   Medications administered for sedation prior to procedure:   Rocuronium 50 mg IV, Fentanyl 150 mcg IV.    A time out procedure was called and correct patient, name, & ID confirmed. Needed supplies and equipment were assembled and checked to include ETT, 10 ml syringe, Glidescope, Mac and Miller blades, suction, oxygen and bag mask valve, end tidal CO2 monitor.   Patient was positioned to align the mouth and pharynx to facilitate visualization of the glottis.   Heart rate, SpO2 and blood pressure was continuously monitored during the procedure. Pre-oxygenation was conducted prior to intubation and endotracheal tube was placed through the vocal cords into the trachea.     The artificial airway was placed under direct visualization via glidescope route using a 7.5 ETT on the first attempt.  ETT was secured at 23 cm mark.  Placement was confirmed by auscuitation of lungs with good breath sounds bilaterally and no stomach sounds.  Condensation was noted on endotracheal tube.   Pulse ox 98%.  CO2 detector in place with appropriate color change.   Complications: None .    Chest radiograph ordered and pending.   Comments: OGT placed via glidescope.   Ottie Glazier, M.D.  Pulmonary & [place]

## 2021-01-15 NOTE — Progress Notes (Signed)
NAME:  Dana Bruce, MRN:  [id number], DOB:  1966/05/24, LOS: 1 ADMISSION DATE:  01/20/2021, CONSULTATION DATE:  01/13/2021 REFERRING MD:  Dr. Fuller Plan,  CHIEF COMPLAINT: Shortness of breath  Brief History:  55 year old unvaccinated female admitted with acute hypoxic respiratory failure in the setting of COVID-19 pneumonia  History of Present Illness:  Dana Bruce is a 55 year old female with a past medical history as listed below who presents to [facility] ED on 01/22/2021 with complaints of shortness of breath.  She reports she began having Covid-like symptoms (malaise, body aches shortness of breath,cough) last Friday 01/08/21.  She tested positive on 01/10/2021.  She reports that her shortness of breath has worsened over the past several days.  She denied chest pain, abdominal pain, vomiting, dysuria, palpitations, dizziness, edema. She is unvaccinated for COVID-19.  ED course: Upon presentation to the ED she was noted to be severely hypoxic with O2 saturations 50% on room air, tachypneic, and tachycardic.  Vitals included: Temperature 97.4 F, respiratory rate 20, pulse 110, blood pressure 124/85.  She was placed on 100% FiO2 via heated 5 flow nasal cannula at 35 L with improvement in O2 saturations.  Work-up reveals glucose 261, BUN 19, creatinine 1.03, LDH 229, high-sensitivity troponin 12, ferritin 205, WBC 12.9, lactic acid 1.6, procalcitonin 0.21, fibrinogen 623, fibrin derivative products 1156.  Chest x-ray shows diffuse bilateral groundglass opacities consistent with COVID-19 pneumonia. She meets SIRS criteria given her tachycardia, tachypnea, and WBC of 12.9.  PCCM is asked to admit the patient to ICU for further work-up and treatment of acute hypoxic respiratory failure in the setting of COVID-19 pneumonia.  She is high risk for decompensation and  need for intubation.  01/15/2021- patient with worsening resp distress s/p ETT.  Has been started on lasix. Due to BMI >62 will hold proning. Patient  with resp failure s/p ETT - I have updated husband Vonna Kotyk via phone today.   Past Medical History:  Hypertension Diabetes mellitus Arthritis Goiter IBS  Significant Hospital Events:  2/3: Admission to stepdown  Consults:  PCCM  Procedures:  N/A  Significant Diagnostic Tests:  2/3: Chest x-ray>>IMPRESSION: Diffuse bilateral ground-glass airspace opacities consistent with the patient's history of viral pneumonia.  Micro Data:  1/30: SARS-CoV-2 PCR>> positive 2/3: Blood culture x2>> 2/3: Sputum>> 2/3: Strep pneumo urinary antigen>> 2/3: Legionella urinary antigen>>  Antimicrobials:  Remdesivir 2/3>> Levaquin 2/3>>  Interim History / Subjective:  -Patient currently on nonrebreather mask + 100% FiO2 heated high flow nasal cannula with O2 saturations 90 to 93%, mild tachypnea -Hemodynamically stable -Patient reports shortness of breath and cough (intermittently productive of yellowish-gray sputum), along with intermittent nausea and fatigue -Denies chest pain, fever, chills, abdominal pain, dysuria, dizziness  Objective   Blood pressure 125/64, pulse (!) 120, temperature 98.5 F (36.9 C), temperature source Axillary, resp. rate (!) 21, height 5\' 7"  (1.702 m), weight (!) 179.6 kg, SpO2 94 %.    Vent Mode: PRVC FiO2 (%):  [90 %-100 %] 90 % Set Rate:  [16 bmp] 16 bmp Vt Set:  [500 mL] 500 mL PEEP:  [10 cmH20] 10 cmH20   Intake/Output Summary (Last 24 hours) at 01/15/2021 1231 Last data filed at 01/15/2021 1200 Gross per 24 hour  Intake 497.31 ml  Output 675 ml  Net -177.69 ml   Filed Weights   02/04/2021 1441 02/03/2021 1508 01/15/21 0420  Weight: (!) 204.1 kg (!) 187.8 kg (!) 179.6 kg    Examination: General: Acutely ill-appearing obese female, sitting in bed,  on NRB and HHFNC, mild tachypnea with acute distress HENT: Atraumatic, normocephalic, neck supple, no JVD Lungs: Clear diminished breath sounds bilaterally, mild tachypnea, even Cardiovascular: Tachycardia,  regular rhythm, S1-S2, good peripheral circulation Abdomen: severely Obese, soft, nontender, nondistended, no guarding or rebound tenderness, bowel sounds positive x4 Extremities: No deformities, no edema Neuro: Awake, alert and oriented x4, follows commands, no focal deficits, speech clear GU: Deferred Skin: Warm and dry.  No obvious rashes, lesions, ulcerations  IMAGING      Assessment & Plan:   Acute Hypoxic Respiratory Failure in the setting of COVID-19 Pneumonia -Supplemental O2 as needed to maintain O2 saturations greater than 88% -Currently s/p I intubation On MV - PRVC 100% -Full ventilator support -follow NIH ARDS net protocol high PEEP ladder -Remdesivir, plan for 5 days -Steroids (Solu-Medrol 60 mg twice daily) -Follow inflammatory markers: Ferritin, D-dimer, CRP -Vitamin C, zinc -Plan to repeat and check resp cultures -Antitussives -Bronchodilators PRN via MDI -Maintain euvolemia to net negative fluid balance as able -Holding proning for now -Incentive spirometry and flutter valve -Maintain airborne and contact precautions   Sepsis secondary to COVID-19 Pneumonia  Superimposed Bacterial Pneumonia -Monitor fever curve -Trend WBCs and procalcitonin -Follow cultures as above -Place on empiric Levaquin - plan for 5d totaal, procalcitonin trending down -Lactic acid 1.6   Diabetes mellitus -CBGs -Sliding scale insulin -Follow ICU hypo/hyperglycemia protocol   Best practice (evaluated daily)  Diet: NPO for now given respiratory status Pain/Anxiety/Delirium protocol (if indicated): N/A VAP protocol (if indicated): N/A DVT prophylaxis: Lovenox subcu GI prophylaxis: Pepcid Glucose control: Sliding scale insulin Mobility: As tolerated Disposition: ICU  Goals of Care:  Last date of multidisciplinary goals of care discussion: N/A Family and staff present: Updated patient at bedside 01/28/2021 Summary of discussion: Treating Covid with remdesivir, steroids,  empiric antibiotics for now Follow up goals of care discussion due: 01/15/2021 Code Status: Full code  Labs   CBC: Recent Labs  Lab 01/23/2021 1441 01/15/21 0457  WBC 12.9* 10.0  HGB 12.1 12.5  HCT 39.4 40.0  MCV 86.8 85.7  PLT 284 285    Basic Metabolic Panel: Recent Labs  Lab 01/31/2021 1457 01/15/21 0457  NA 139 138  K 4.2 4.3  CL 101 99  CO2 25 24  GLUCOSE 261* 330*  BUN 19 18  CREATININE 1.03* 1.01*  CALCIUM 9.9 9.3   GFR: Estimated Creatinine Clearance: 108.1 mL/min (A) (by C-G formula based on SCr of 1.01 mg/dL (H)). Recent Labs  Lab 01/22/2021 1441 01/21/2021 1457 01/15/21 0457  PROCALCITON  --  0.21 0.15  WBC 12.9*  --  10.0  LATICACIDVEN 1.6  --   --     Liver Function Tests: Recent Labs  Lab 02/03/2021 1457 01/15/21 0457  AST 40 33  ALT 25 22  ALKPHOS 58 60  BILITOT 0.9 1.1  PROT 7.2 6.9  ALBUMIN 2.8* 2.4*   No results for input(s): LIPASE, AMYLASE in the last 168 hours. No results for input(s): AMMONIA in the last 168 hours.  ABG    Component Value Date/Time   PHART 7.33 (L) 01/15/2021 0458   PCO2ART 51 (H) 01/15/2021 0458   PO2ART 55 (L) 01/15/2021 0458   HCO3 29.4 (H) 01/15/2021 1133   ACIDBASEDEF 0.8 01/15/2021 1133   O2SAT 99.1 01/15/2021 1133     Coagulation Profile: No results for input(s): INR, PROTIME in the last 168 hours.  Cardiac Enzymes: No results for input(s): CKTOTAL, CKMB, CKMBINDEX, TROPONINI in the last 168 hours.  HbA1C:  Hgb A1c MFr Bld  Date/Time Value Ref Range Status  09-Feb-2021 02:41 PM 6.3 (H) 4.8 - 5.6 % Final    Comment:    (NOTE) Pre diabetes:          5.7%-6.4%  Diabetes:              >6.4%  Glycemic control for   <7.0% adults with diabetes     CBG: Recent Labs  Lab 09-Feb-2021 2012 01/15/21 0033 01/15/21 0350 01/15/21 0748 01/15/21 1139  GLUCAP 314* 276* 288* 307* 267*    Review of Systems:   Positives in BOLD: Gen: Denies fever, chills, weight change, +fatigue, night sweats HEENT: Denies  blurred vision, double vision, hearing loss, tinnitus, sinus congestion, rhinorrhea, sore throat, neck stiffness, dysphagia PULM: Denies +shortness of breath, +cough, +sputum production, hemoptysis, wheezing CV: Denies chest pain, edema, orthopnea, paroxysmal nocturnal dyspnea, palpitations GI: Denies abdominal pain, +nausea, vomiting, diarrhea, hematochezia, melena, constipation, change in bowel habits GU: Denies dysuria, hematuria, polyuria, oliguria, urethral discharge Endocrine: Denies hot or cold intolerance, polyuria, polyphagia or appetite change Derm: Denies rash, dry skin, scaling or peeling skin change Heme: Denies easy bruising, bleeding, bleeding gums Neuro: Denies headache, numbness, weakness, slurred speech, loss of memory or consciousness   Past Medical History:  She,  has a past medical history of Arthritis, Diabetes mellitus without complication (HCC), Family history of adverse reaction to anesthesia, Goiter, Hypertension, IBS (irritable bowel syndrome), and PONV (postoperative nausea and vomiting).   Surgical History:   Past Surgical History:  Procedure Laterality Date  . ABDOMINAL HYSTERECTOMY     partial  . CHOLECYSTECTOMY    . COLONOSCOPY WITH PROPOFOL N/A 05/17/2017   Procedure: COLONOSCOPY WITH PROPOFOL;  Surgeon: Willis Modena, MD;  Location: [facility];  Service: Endoscopy;  Laterality: N/A;  . colonscopy  1995 or 1996   with barium enema  . DENTAL SURGERY    . DILATION AND CURETTAGE OF UTERUS     x 2  . surgery for ecotic pregnancy       Social History:   reports that she has never smoked. She has never used smokeless tobacco. She reports that she does not drink alcohol and does not use drugs.   Family History:  Her family history is not on file.   Allergies Allergies  Allergen Reactions  . Lasix [Furosemide] Itching  . Penicillins Hives and Itching    Has patient had a PCN reaction causing immediate rash, facial/tongue/throat swelling, SOB or  lightheadedness with hypotension: yes Has patient had a PCN reaction causing severe rash involving mucus membranes or skin necrosis: no Has patient had a PCN reaction that required hospitalization No Has patient had a PCN reaction occurring within the last 10 years: No If all of the above answers are "NO", then may proceed with Cephalosporin use.   Rolland Porter Flavor [Flavoring Agent] Itching, Nausea And Vomiting and Rash    Breaks out in places where she touches strawberries      Home Medications  Prior to Admission medications   Medication Sig Start Date End Date Taking? Authorizing Provider  amLODipine (NORVASC) 10 MG tablet Take 10 mg by mouth daily.  01/29/14   [provider]  aspirin 81 MG tablet Take 81 mg by mouth daily.    [provider]  cetirizine (ZYRTEC) 10 MG tablet Take 10 mg by mouth at bedtime.    [provider]  fenofibrate (TRICOR) 145 MG tablet Take 145 mg by mouth every evening.  [provider]  fluticasone (FLONASE) 50 MCG/ACT nasal spray Place 2 sprays into both nostrils at bedtime. 01/18/17   [provider]  Glucos-Chond-Hyal Ac-Ca Fructo (MOVE FREE JOINT HEALTH ADVANCE) TABS Take 1 tablet by mouth 2 (two) times daily.    [provider]  hyoscyamine (LEVSIN SL) 0.125 MG SL tablet Take 0.25 mg by mouth 3 (three) times daily as needed.  01/29/14   [provider]  Lactobacillus (ACIDOPHILUS PO) Take 1 capsule by mouth daily.    [provider]  Menthol-Zinc Oxide (CALMOSEPTINE EX) Apply 1 application topically 4 (four) times daily as needed (diarrhea).    [provider]  metFORMIN (GLUCOPHAGE) 1000 MG tablet Take 1,000 mg by mouth 2 (two) times daily.  02/23/14   [provider]  metoprolol (LOPRESSOR) 100 MG tablet Take 100 mg by mouth 2 (two) times daily. 02/11/17   [provider]  naproxen sodium (ALEVE) 220 MG tablet Take 440 mg by mouth 2 (two) times daily with a  meal.    [provider]  ONE TOUCH ULTRA TEST test strip  12/18/13   [provider]  pioglitazone (ACTOS) 30 MG tablet Take 30 mg by mouth daily.  02/23/14   [provider]  promethazine (PHENERGAN) 25 MG tablet Take 25 mg by mouth 2 (two) times daily as needed for nausea or vomiting.  01/29/14   [provider]  simvastatin (ZOCOR) 20 MG tablet Take 20 mg by mouth at bedtime.  02/23/14   [provider]  TRULICITY 1.5 MG/0.5ML SOPN Inject 1.5 mg into the skin once a week. Sunday 01/18/17   [provider]  valsartan-hydrochlorothiazide (DIOVAN-HCT) 320-25 MG per tablet Take 1 tablet by mouth daily.  01/29/14   [provider]     Critical care time: 33 minutes     Critical care provider statement:    Critical care time (minutes):  33   Critical care time was exclusive of:  Separately billable procedures and  treating other patients   Critical care was necessary to treat or prevent imminent or  life-threatening deterioration of the following conditions:  acute hypoxemic resp failure due to COVID19, multiple comorbid conditions.    Critical care was time spent personally by me on the following  activities:  Development of treatment plan with patient or surrogate,  discussions with consultants, evaluation of patient's response to  treatment, examination of patient, obtaining history from patient or  surrogate, ordering and performing treatments and interventions, ordering  and review of laboratory studies and re-evaluation of patient's condition   I assumed direction of critical care for this patient from another  provider in my specialty: no     Vida Rigger, M.D.  Pulmonary & Critical Care Medicine  [facility]

## 2021-01-15 NOTE — Progress Notes (Signed)
NAME:  Dana Bruce, MRN:  [id number], DOB:  June 25, 1966, LOS: 1 ADMISSION DATE:  01/24/2021, CONSULTATION DATE:  01/20/2021 REFERRING MD:  Dr. Fuller Plan,  CHIEF COMPLAINT: Shortness of breath  Brief History:  55 year old unvaccinated female admitted with acute hypoxic respiratory failure in the setting of COVID-19 pneumonia  History of Present Illness:  Dana Bruce is a 55 year old female with a past medical history as listed below who presents to [facility] ED on 01/20/2021 with complaints of shortness of breath.  She reports she began having Covid-like symptoms (malaise, body aches shortness of breath,cough) last Friday 01/08/21.  She tested positive on 01/10/2021.  She reports that her shortness of breath has worsened over the past several days.  She denied chest pain, abdominal pain, vomiting, dysuria, palpitations, dizziness, edema. She is unvaccinated for COVID-19.  ED course: Upon presentation to the ED she was noted to be severely hypoxic with O2 saturations 50% on room air, tachypneic, and tachycardic.  Vitals included: Temperature 97.4 F, respiratory rate 20, pulse 110, blood pressure 124/85.  She was placed on 100% FiO2 via heated 5 flow nasal cannula at 35 L with improvement in O2 saturations.  Work-up reveals glucose 261, BUN 19, creatinine 1.03, LDH 229, high-sensitivity troponin 12, ferritin 205, WBC 12.9, lactic acid 1.6, procalcitonin 0.21, fibrinogen 623, fibrin derivative products 1156.  Chest x-ray shows diffuse bilateral groundglass opacities consistent with COVID-19 pneumonia. She meets SIRS criteria given her tachycardia, tachypnea, and WBC of 12.9.  PCCM is asked to admit the patient to ICU for further work-up and treatment of acute hypoxic respiratory failure in the setting of COVID-19 pneumonia.  She is high risk for decompensation and  need for intubation.  01/15/2021- patient with worsening resp distress s/p ETT.  Has been started on lasix. Due to BMI >62 will hold proning. Patient  with resp failure s/p ETT - I have updated husband Vonna Kotyk via phone today.   Past Medical History:  Hypertension Diabetes mellitus Arthritis Goiter IBS  Significant Hospital Events:  2/3: Admission to stepdown  Consults:  PCCM  Procedures:  N/A  Significant Diagnostic Tests:  2/3: Chest x-ray>>IMPRESSION: Diffuse bilateral ground-glass airspace opacities consistent with the patient's history of viral pneumonia.  Micro Data:  1/30: SARS-CoV-2 PCR>> positive 2/3: Blood culture x2>> 2/3: Sputum>> 2/3: Strep pneumo urinary antigen>> 2/3: Legionella urinary antigen>>  Antimicrobials:  Remdesivir 2/3>> Levaquin 2/3>>  Interim History / Subjective:  -Patient currently on nonrebreather mask + 100% FiO2 heated high flow nasal cannula with O2 saturations 90 to 93%, mild tachypnea -Hemodynamically stable -Patient reports shortness of breath and cough (intermittently productive of yellowish-gray sputum), along with intermittent nausea and fatigue -Denies chest pain, fever, chills, abdominal pain, dysuria, dizziness  Objective   Blood pressure (!) 167/99, pulse (!) 115, temperature 98.2 F (36.8 C), temperature source Axillary, resp. rate (!) 22, height 5\' 7"  (1.702 m), weight (!) 179.6 kg, SpO2 90 %.    FiO2 (%):  [100 %] 100 %   Intake/Output Summary (Last 24 hours) at 01/15/2021 0855 Last data filed at 01/15/2021 0109 Gross per 24 hour  Intake 260.34 ml  Output 675 ml  Net -414.66 ml   Filed Weights   01/17/2021 1441 01/26/2021 1508 01/15/21 0420  Weight: (!) 204.1 kg (!) 187.8 kg (!) 179.6 kg    Examination: General: Acutely ill-appearing obese female, sitting in bed, on NRB and HHFNC, mild tachypnea with acute distress HENT: Atraumatic, normocephalic, neck supple, no JVD Lungs: Clear diminished breath sounds bilaterally, mild  tachypnea, even Cardiovascular: Tachycardia, regular rhythm, S1-S2, good peripheral circulation Abdomen: severely Obese, soft, nontender,  nondistended, no guarding or rebound tenderness, bowel sounds positive x4 Extremities: No deformities, no edema Neuro: Awake, alert and oriented x4, follows commands, no focal deficits, speech clear GU: Deferred Skin: Warm and dry.  No obvious rashes, lesions, ulcerations  IMAGING      Assessment & Plan:   Acute Hypoxic Respiratory Failure in the setting of COVID-19 Pneumonia -Supplemental O2 as needed to maintain O2 saturations greater than 88% -Currently s/p I intubation On MV - PRVC 100% -Full ventilator support -follow NIH ARDS net protocol high PEEP ladder -Remdesivir, plan for 5 days -Steroids (Solu-Medrol 60 mg twice daily) -Follow inflammatory markers: Ferritin, D-dimer, CRP -Vitamin C, zinc -Plan to repeat and check resp cultures -Antitussives -Bronchodilators PRN via MDI -Maintain euvolemia to net negative fluid balance as able -Holding proning for now -Incentive spirometry and flutter valve -Maintain airborne and contact precautions   Sepsis secondary to COVID-19 Pneumonia  Superimposed Bacterial Pneumonia -Monitor fever curve -Trend WBCs and procalcitonin -Follow cultures as above -Place on empiric Levaquin - plan for 5d totaal, procalcitonin trending down -Lactic acid 1.6   Diabetes mellitus -CBGs -Sliding scale insulin -Follow ICU hypo/hyperglycemia protocol   Best practice (evaluated daily)  Diet: NPO for now given respiratory status Pain/Anxiety/Delirium protocol (if indicated): N/A VAP protocol (if indicated): N/A DVT prophylaxis: Lovenox subcu GI prophylaxis: Pepcid Glucose control: Sliding scale insulin Mobility: As tolerated Disposition: ICU  Goals of Care:  Last date of multidisciplinary goals of care discussion: N/A Family and staff present: Updated patient at bedside 02/06/2021 Summary of discussion: Treating Covid with remdesivir, steroids, empiric antibiotics for now Follow up goals of care discussion due: 01/15/2021 Code Status: Full  code  Labs   CBC: Recent Labs  Lab 01/28/2021 1441 01/15/21 0457  WBC 12.9* 10.0  HGB 12.1 12.5  HCT 39.4 40.0  MCV 86.8 85.7  PLT 284 285    Basic Metabolic Panel: Recent Labs  Lab 02/07/2021 1457 01/15/21 0457  NA 139 138  K 4.2 4.3  CL 101 99  CO2 25 24  GLUCOSE 261* 330*  BUN 19 18  CREATININE 1.03* 1.01*  CALCIUM 9.9 9.3   GFR: Estimated Creatinine Clearance: 108.1 mL/min (A) (by C-G formula based on SCr of 1.01 mg/dL (H)). Recent Labs  Lab 01/24/2021 1441 01/27/2021 1457 01/15/21 0457  PROCALCITON  --  0.21 0.15  WBC 12.9*  --  10.0  LATICACIDVEN 1.6  --   --     Liver Function Tests: Recent Labs  Lab 01/19/2021 1457 01/15/21 0457  AST 40 33  ALT 25 22  ALKPHOS 58 60  BILITOT 0.9 1.1  PROT 7.2 6.9  ALBUMIN 2.8* 2.4*   No results for input(s): LIPASE, AMYLASE in the last 168 hours. No results for input(s): AMMONIA in the last 168 hours.  ABG    Component Value Date/Time   PHART 7.33 (L) 01/15/2021 0458   PCO2ART 51 (H) 01/15/2021 0458   PO2ART 55 (L) 01/15/2021 0458   HCO3 26.9 01/15/2021 0458   ACIDBASEDEF 0.1 01/15/2021 0458   O2SAT 85.8 01/15/2021 0458     Coagulation Profile: No results for input(s): INR, PROTIME in the last 168 hours.  Cardiac Enzymes: No results for input(s): CKTOTAL, CKMB, CKMBINDEX, TROPONINI in the last 168 hours.  HbA1C: Hgb A1c MFr Bld  Date/Time Value Ref Range Status  01/21/2021 02:41 PM 6.3 (H) 4.8 - 5.6 % Final  Comment:    (NOTE) Pre diabetes:          5.7%-6.4%  Diabetes:              >6.4%  Glycemic control for   <7.0% adults with diabetes     CBG: Recent Labs  Lab 02/08/2021 1815 01/19/2021 2012 01/15/21 0033 01/15/21 0350 01/15/21 0748  GLUCAP 255* 314* 276* 288* 307*    Review of Systems:   Positives in BOLD: Gen: Denies fever, chills, weight change, +fatigue, night sweats HEENT: Denies blurred vision, double vision, hearing loss, tinnitus, sinus congestion, rhinorrhea, sore throat,  neck stiffness, dysphagia PULM: Denies +shortness of breath, +cough, +sputum production, hemoptysis, wheezing CV: Denies chest pain, edema, orthopnea, paroxysmal nocturnal dyspnea, palpitations GI: Denies abdominal pain, +nausea, vomiting, diarrhea, hematochezia, melena, constipation, change in bowel habits GU: Denies dysuria, hematuria, polyuria, oliguria, urethral discharge Endocrine: Denies hot or cold intolerance, polyuria, polyphagia or appetite change Derm: Denies rash, dry skin, scaling or peeling skin change Heme: Denies easy bruising, bleeding, bleeding gums Neuro: Denies headache, numbness, weakness, slurred speech, loss of memory or consciousness   Past Medical History:  She,  has a past medical history of Arthritis, Diabetes mellitus without complication (HCC), Family history of adverse reaction to anesthesia, Goiter, Hypertension, IBS (irritable bowel syndrome), and PONV (postoperative nausea and vomiting).   Surgical History:   Past Surgical History:  Procedure Laterality Date  . ABDOMINAL HYSTERECTOMY     partial  . CHOLECYSTECTOMY    . COLONOSCOPY WITH PROPOFOL N/A 05/17/2017   Procedure: COLONOSCOPY WITH PROPOFOL;  Surgeon: Willis Modena, MD;  Location: [facility];  Service: Endoscopy;  Laterality: N/A;  . colonscopy  1995 or 1996   with barium enema  . DENTAL SURGERY    . DILATION AND CURETTAGE OF UTERUS     x 2  . surgery for ecotic pregnancy       Social History:   reports that she has never smoked. She has never used smokeless tobacco. She reports that she does not drink alcohol and does not use drugs.   Family History:  Her family history is not on file.   Allergies Allergies  Allergen Reactions  . Lasix [Furosemide] Itching  . Penicillins Hives and Itching    Has patient had a PCN reaction causing immediate rash, facial/tongue/throat swelling, SOB or lightheadedness with hypotension: yes Has patient had a PCN reaction causing severe rash involving  mucus membranes or skin necrosis: no Has patient had a PCN reaction that required hospitalization No Has patient had a PCN reaction occurring within the last 10 years: No If all of the above answers are "NO", then may proceed with Cephalosporin use.   Rolland Porter Flavor [Flavoring Agent] Itching, Nausea And Vomiting and Rash    Breaks out in places where she touches strawberries      Home Medications  Prior to Admission medications   Medication Sig Start Date End Date Taking? Authorizing Provider  amLODipine (NORVASC) 10 MG tablet Take 10 mg by mouth daily.  01/29/14   [provider]  aspirin 81 MG tablet Take 81 mg by mouth daily.    [provider]  cetirizine (ZYRTEC) 10 MG tablet Take 10 mg by mouth at bedtime.    [provider]  fenofibrate (TRICOR) 145 MG tablet Take 145 mg by mouth every evening.    [provider]  fluticasone (FLONASE) 50 MCG/ACT nasal spray Place 2 sprays into both nostrils at bedtime. 01/18/17   [provider]  Glucos-Chond-Hyal Ac-Ca Fructo (MOVE FREE JOINT HEALTH ADVANCE) TABS Take 1 tablet by mouth 2 (two) times daily.    [provider]  hyoscyamine (LEVSIN SL) 0.125 MG SL tablet Take 0.25 mg by mouth 3 (three) times daily as needed.  01/29/14   [provider]  Lactobacillus (ACIDOPHILUS PO) Take 1 capsule by mouth daily.    [provider]  Menthol-Zinc Oxide (CALMOSEPTINE EX) Apply 1 application topically 4 (four) times daily as needed (diarrhea).    [provider]  metFORMIN (GLUCOPHAGE) 1000 MG tablet Take 1,000 mg by mouth 2 (two) times daily.  02/23/14   [provider]  metoprolol (LOPRESSOR) 100 MG tablet Take 100 mg by mouth 2 (two) times daily. 02/11/17   [provider]  naproxen sodium (ALEVE) 220 MG tablet Take 440 mg by mouth 2 (two) times daily with a meal.    [provider]  ONE TOUCH ULTRA TEST test strip  12/18/13   [provider]  pioglitazone (ACTOS) 30 MG tablet Take 30 mg by mouth daily.  02/23/14   [provider]  promethazine (PHENERGAN) 25 MG tablet Take 25 mg by mouth 2 (two) times daily as needed for nausea or vomiting.  01/29/14   [provider]  simvastatin (ZOCOR) 20 MG tablet Take 20 mg by mouth at bedtime.  02/23/14   [provider]  TRULICITY 1.5 MG/0.5ML SOPN Inject 1.5 mg into the skin once a week. Sunday 01/18/17   [provider]  valsartan-hydrochlorothiazide (DIOVAN-HCT) 320-25 MG per tablet Take 1 tablet by mouth daily.  01/29/14   [provider]     Critical care time: 33 minutes     Critical care provider statement:    Critical care time (minutes):  33   Critical care time was exclusive of:  Separately billable procedures and  treating other patients   Critical care was necessary to treat or prevent imminent or  life-threatening deterioration of the following conditions:  acute hypoxemic resp failure due to COVID19, multiple comorbid conditions.    Critical care was time spent personally by me on the following  activities:  Development of treatment plan with patient or surrogate,  discussions with consultants, evaluation of patient's response to  treatment, examination of patient, obtaining history from patient or  surrogate, ordering and performing treatments and interventions, ordering  and review of laboratory studies and re-evaluation of patient's condition   I assumed direction of critical care for this patient from another  provider in my specialty: no     Vida Rigger, M.D.  Pulmonary & Critical Care Medicine  [facility]

## 2021-01-16 LAB — GLUCOSE, CAPILLARY
Glucose-Capillary: 323 mg/dL — ABNORMAL HIGH (ref 70–99)
Glucose-Capillary: 294 mg/dL — ABNORMAL HIGH (ref 70–99)
Glucose-Capillary: 256 mg/dL — ABNORMAL HIGH (ref 70–99)
Glucose-Capillary: 253 mg/dL — ABNORMAL HIGH (ref 70–99)
Glucose-Capillary: 285 mg/dL — ABNORMAL HIGH (ref 70–99)
Glucose-Capillary: 211 mg/dL — ABNORMAL HIGH (ref 70–99)

## 2021-01-16 LAB — COMPREHENSIVE METABOLIC PANEL
ALT: 22 U/L (ref 0–44)
AST: 33 U/L (ref 15–41)
Albumin: 2.4 g/dL — ABNORMAL LOW (ref 3.5–5.0)
Alkaline Phosphatase: 63 U/L (ref 38–126)
Anion gap: 12 (ref 5–15)
BUN: 30 mg/dL — ABNORMAL HIGH (ref 6–20)
CO2: 28 mmol/L (ref 22–32)
Calcium: 9 mg/dL (ref 8.9–10.3)
Chloride: 100 mmol/L (ref 98–111)
Creatinine, Ser: 1.49 mg/dL — ABNORMAL HIGH (ref 0.44–1.00)
GFR, Estimated: 41 mL/min — ABNORMAL LOW (ref >60)
Glucose, Bld: 326 mg/dL — ABNORMAL HIGH (ref 70–99)
Potassium: 4.9 mmol/L (ref 3.5–5.1)
Sodium: 140 mmol/L (ref 135–145)
Total Bilirubin: 0.8 mg/dL (ref 0.3–1.2)
Total Protein: 6.7 g/dL (ref 6.5–8.1)

## 2021-01-16 LAB — C-REACTIVE PROTEIN: CRP: 15.4 mg/dL — ABNORMAL HIGH (ref <1.0)

## 2021-01-16 LAB — CBC
HCT: 39.4 % (ref 36.0–46.0)
Hemoglobin: 11.7 g/dL — ABNORMAL LOW (ref 12.0–15.0)
MCH: 26.5 pg (ref 26.0–34.0)
MCHC: 29.7 g/dL — ABNORMAL LOW (ref 30.0–36.0)
MCV: 89.1 fL (ref 80.0–100.0)
Platelets: 357 10*3/uL (ref 150–400)
RBC: 4.42 MIL/uL (ref 3.87–5.11)
RDW: 14.8 % (ref 11.5–15.5)
WBC: 11 10*3/uL — ABNORMAL HIGH (ref 4.0–10.5)
nRBC: 0.4 % — ABNORMAL HIGH (ref 0.0–0.2)

## 2021-01-16 LAB — FERRITIN: Ferritin: 294 ng/mL (ref 11–307)

## 2021-01-16 LAB — FIBRIN DERIVATIVES D-DIMER (ARMC ONLY): Fibrin derivatives D-dimer (ARMC): 1269.17 ng/mL (FEU) — ABNORMAL HIGH (ref 0.00–499.00)

## 2021-01-16 LAB — PROCALCITONIN: Procalcitonin: 0.16 ng/mL

## 2021-01-16 LAB — TRIGLYCERIDES: Triglycerides: 227 mg/dL — ABNORMAL HIGH (ref <150)

## 2021-01-16 MED ORDER — METOPROLOL TARTRATE 5 MG/5ML IV SOLN: 5.0000 mg | Freq: Once | INTRAVENOUS | Status: AC

## 2021-01-16 MED ORDER — SODIUM CHLORIDE 0.9% FLUSH
10.0000 mL | Freq: Two times a day (BID) | INTRAVENOUS | Status: DC
  Administered 2021-01-16 – 2021-01-17 (×3): 10 mL
  Administered 2021-01-17: 40 mL
  Administered 2021-01-18: 30 mL
  Administered 2021-01-18: 10 mL
  Administered 2021-01-19: 30 mL
  Administered 2021-01-20: 10 mL
  Administered 2021-01-20: 20 mL
  Administered 2021-01-20 – 2021-02-01 (×17): 10 mL

## 2021-01-16 MED ORDER — SODIUM CHLORIDE 0.9% FLUSH: 10.0000 mL | INTRAVENOUS | Status: DC | PRN

## 2021-01-16 MED ORDER — METOPROLOL TARTRATE 5 MG/5ML IV SOLN
INTRAVENOUS | Status: AC
  Administered 2021-01-16: 5 mg via INTRAVENOUS
  Filled 2021-01-16: qty 5

## 2021-01-16 MED ORDER — METOPROLOL TARTRATE 25 MG PO TABS
12.5000 mg | ORAL_TABLET | Freq: Two times a day (BID) | ORAL | Status: DC
  Administered 2021-01-16 – 2021-01-17 (×3): 12.5 mg
  Filled 2021-01-16 (×3): qty 1

## 2021-01-16 MED ORDER — MIDAZOLAM HCL 2 MG/2ML IJ SOLN
2.0000 mg | INTRAMUSCULAR | Status: DC | PRN
  Administered 2021-01-24 – 2021-01-31 (×4): 2 mg via INTRAVENOUS
  Filled 2021-01-16 (×4): qty 2

## 2021-01-16 MED ORDER — INSULIN DETEMIR 100 UNIT/ML ~~LOC~~ SOLN
14.0000 [IU] | Freq: Every day | SUBCUTANEOUS | Status: DC
  Administered 2021-01-16 – 2021-01-20 (×5): 14 [IU] via SUBCUTANEOUS
  Filled 2021-01-16 (×6): qty 0.14

## 2021-01-16 MED ORDER — DOCUSATE SODIUM 50 MG/5ML PO LIQD
100.0000 mg | Freq: Two times a day (BID) | ORAL | Status: DC
  Administered 2021-01-16 – 2021-01-19 (×6): 100 mg
  Filled 2021-01-16 (×6): qty 10

## 2021-01-16 MED ORDER — MIDAZOLAM HCL 2 MG/2ML IJ SOLN
2.0000 mg | INTRAMUSCULAR | Status: AC | PRN
Start: 2021-01-16 — End: 2021-01-26
  Administered 2021-01-19 – 2021-01-26 (×3): 2 mg via INTRAVENOUS
  Filled 2021-01-16 (×4): qty 2

## 2021-01-16 MED ORDER — POLYETHYLENE GLYCOL 3350 17 G PO PACK
17.0000 g | PACK | Freq: Every day | ORAL | Status: DC
  Administered 2021-01-16 – 2021-01-26 (×8): 17 g
  Filled 2021-01-16 (×8): qty 1

## 2021-01-16 MED ORDER — METHYLPREDNISOLONE SODIUM SUCC 40 MG IJ SOLR
40.0000 mg | Freq: Two times a day (BID) | INTRAMUSCULAR | Status: DC
  Administered 2021-01-16 – 2021-01-21 (×11): 40 mg via INTRAVENOUS
  Filled 2021-01-16 (×11): qty 1

## 2021-01-16 NOTE — Progress Notes (Signed)
NAME:  Dana Bruce, MRN:  [id number], DOB:  1966/06/08, LOS: 2 ADMISSION DATE:  02/04/2021, CONSULTATION DATE:  02/03/2021 REFERRING MD:  Dr. Fuller Plan,  CHIEF COMPLAINT: Shortness of breath  Brief History:  55 year old unvaccinated female admitted with acute hypoxic respiratory failure in the setting of COVID-19 pneumonia  History of Present Illness:  Dana Bruce is a 55 year old female with a past medical history as listed below who presents to [facility] ED on 01/23/2021 with complaints of shortness of breath.  She reports she began having Covid-like symptoms (malaise, body aches shortness of breath,cough) last Friday 01/08/21.  She tested positive on 01/10/2021.  She reports that her shortness of breath has worsened over the past several days.  She denied chest pain, abdominal pain, vomiting, dysuria, palpitations, dizziness, edema. She is unvaccinated for COVID-19.  ED course: Upon presentation to the ED she was noted to be severely hypoxic with O2 saturations 50% on room air, tachypneic, and tachycardic.  Vitals included: Temperature 97.4 F, respiratory rate 20, pulse 110, blood pressure 124/85.  She was placed on 100% FiO2 via heated 5 flow nasal cannula at 35 L with improvement in O2 saturations.  Work-up reveals glucose 261, BUN 19, creatinine 1.03, LDH 229, high-sensitivity troponin 12, ferritin 205, WBC 12.9, lactic acid 1.6, procalcitonin 0.21, fibrinogen 623, fibrin derivative products 1156.  Chest x-ray shows diffuse bilateral groundglass opacities consistent with COVID-19 pneumonia. She meets SIRS criteria given her tachycardia, tachypnea, and WBC of 12.9.  PCCM is asked to admit the patient to ICU for further work-up and treatment of acute hypoxic respiratory failure in the setting of COVID-19 pneumonia.  She is high risk for decompensation and  need for intubation.   Subjective Findings:  01/15/2021- patient with worsening resp distress s/p ETT.  Has been started on lasix. Due to BMI >62  will hold proning. Patient with resp failure s/p ETT - I have updated husband Vonna Kotyk via phone today. 01/16/2021- patient was awake and had attempted to self extubate, sedation was increased. Midline was placed due to inadequate access no need for central line at this time. AM labs with worsening GFR, hyperglycemia.  Vitals stable. UOP adequate appx 1L overnight, CXR with bilateral multifocal infiltrates. Ventilator management performed.    Past Medical History:  Hypertension Diabetes mellitus Arthritis Goiter IBS    Consults:  PCCM  Procedures:  N/A  Significant Diagnostic Tests:  2/3: Chest x-ray>>IMPRESSION: Diffuse bilateral ground-glass airspace opacities consistent with the patient's history of viral pneumonia.  Micro Data:  1/30: SARS-CoV-2 PCR>> positive 2/3: Blood culture x2>> 2/3: Sputum>> 2/3: Strep pneumo urinary antigen>> 2/3: Legionella urinary antigen>>  Antimicrobials:  Remdesivir 2/3>> Levaquin 2/3>>   Objective   Blood pressure 135/72, pulse (!) 135, temperature 99.7 F (37.6 C), temperature source Axillary, resp. rate (!) 33, height 5\' 7"  (1.702 m), weight (!) 179.6 kg, SpO2 94 %.    Vent Mode: PRVC FiO2 (%):  [85 %-90 %] 85 % Set Rate:  [24 bmp] 24 bmp Vt Set:  [500 mL] 500 mL PEEP:  [10 cmH20] 10 cmH20   Intake/Output Summary (Last 24 hours) at 01/16/2021 0934 Last data filed at 01/16/2021 03/16/2021 Gross per 24 hour  Intake 830.53 ml  Output 925 ml  Net -94.47 ml   Filed Weights   01/27/2021 1441 01/19/2021 1508 01/15/21 0420  Weight: (!) 204.1 kg (!) 187.8 kg (!) 179.6 kg    Examination: General: Morbidly obese Age appropreiate.  On MV sedated HENT: Atraumatic, normocephalic, neck supple, no  JVD Lungs: rhonchi + b/l with MV sounds in back Cardiovascular: no MRG appreciated NST Abdomen: severely Obese, +BS Extremities: No deformities, no edema Neuro: Sedated GCS4T GU: Deferred Skin: Warm and dry.  No obvious rashes, lesions,  ulcerations  IMAGING      Assessment & Plan:   Acute Hypoxic Respiratory Failure in the setting of COVID-19 Pneumonia -Supplemental O2 as needed to maintain O2 saturations greater than 88% -Currently s/p I intubation On MV - PRVC 100% with weaning per RT -Full ventilator support -follow NIH ARDS net protocol high PEEP ladder -Remdesivir, plan for 5 days -Steroids (Solu-Medrol 60 mg twice daily)>>>40bid -01/16/2021 -Follow inflammatory markers: Ferritin, D-dimer, CRP -Vitamin C, zinc -Plan to repeat and check resp cultures -Antitussives -Bronchodilators PRN via MDI -Maintain euvolemia to net negative fluid balance as able -Holding proning for now -Incentive spirometry and flutter valve -Maintain airborne and contact precautions            -tracheal aspirate ordered - 01/15/2021    Diabetes mellitus with severe hyperglycemia -CBGs -Sliding scale insulin -Follow ICU hypo/hyperglycemia protocol          -Levemir increased to 14 BID -01/15/2021  Best practice (evaluated daily)  Diet: NPO for now given respiratory status Pain/Anxiety/Delirium protocol (if indicated): N/A VAP protocol (if indicated): N/A DVT prophylaxis: Lovenox subcu GI prophylaxis: Pepcid Glucose control: Sliding scale insulin Mobility: As tolerated Disposition: ICU  Goals of Care:   Code Status: Full code  Labs   CBC: Recent Labs  Lab 01/18/2021 1441 01/15/21 0457 01/16/21 0220  WBC 12.9* 10.0 11.0*  HGB 12.1 12.5 11.7*  HCT 39.4 40.0 39.4  MCV 86.8 85.7 89.1  PLT 284 285 357    Basic Metabolic Panel: Recent Labs  Lab 01/19/2021 1457 01/15/21 0457 01/16/21 0220  NA 139 138 140  K 4.2 4.3 4.9  CL 101 99 100  CO2 25 24 28   GLUCOSE 261* 330* 326*  BUN 19 18 30*  CREATININE 1.03* 1.01* 1.49*  CALCIUM 9.9 9.3 9.0   GFR: Estimated Creatinine Clearance: 73.3 mL/min (A) (by C-G formula based on SCr of 1.49 mg/dL (H)). Recent Labs  Lab 01/18/2021 1441 01/31/2021 1457 01/15/21 0457  01/16/21 0220  PROCALCITON  --  0.21 0.15 0.16  WBC 12.9*  --  10.0 11.0*  LATICACIDVEN 1.6  --   --   --     Liver Function Tests: Recent Labs  Lab 01/12/2021 1457 01/15/21 0457 01/16/21 0220  AST 40 33 33  ALT 25 22 22   ALKPHOS 58 60 63  BILITOT 0.9 1.1 0.8  PROT 7.2 6.9 6.7  ALBUMIN 2.8* 2.4* 2.4*   No results for input(s): LIPASE, AMYLASE in the last 168 hours. No results for input(s): AMMONIA in the last 168 hours.  ABG    Component Value Date/Time   PHART 7.33 (L) 01/15/2021 0458   PCO2ART 51 (H) 01/15/2021 0458   PO2ART 55 (L) 01/15/2021 0458   HCO3 29.4 (H) 01/15/2021 1133   ACIDBASEDEF 0.8 01/15/2021 1133   O2SAT 99.1 01/15/2021 1133     Coagulation Profile: No results for input(s): INR, PROTIME in the last 168 hours.  Cardiac Enzymes: No results for input(s): CKTOTAL, CKMB, CKMBINDEX, TROPONINI in the last 168 hours.  HbA1C: Hgb A1c MFr Bld  Date/Time Value Ref Range Status  02/05/2021 02:41 PM 6.3 (H) 4.8 - 5.6 % Final    Comment:    (NOTE) Pre diabetes:          5.7%-6.4%  Diabetes:              >6.4%  Glycemic control for   <7.0% adults with diabetes     CBG: Recent Labs  Lab 01/15/21 1600 01/15/21 2000 01/15/21 2315 01/16/21 0432 01/16/21 0736  GLUCAP 284* 331* 296* 323* 294*    Review of Systems:    Unable to complete while on MV sedated to RASS-3  Past Medical History:  She,  has a past medical history of Arthritis, Diabetes mellitus without complication (HCC), Family history of adverse reaction to anesthesia, Goiter, Hypertension, IBS (irritable bowel syndrome), and PONV (postoperative nausea and vomiting).   Surgical History:   Past Surgical History:  Procedure Laterality Date  . ABDOMINAL HYSTERECTOMY     partial  . CHOLECYSTECTOMY    . COLONOSCOPY WITH PROPOFOL N/A 05/17/2017   Procedure: COLONOSCOPY WITH PROPOFOL;  Surgeon: Willis Modena, MD;  Location: [facility];  Service: Endoscopy;  Laterality: N/A;  .  colonscopy  1995 or 1996   with barium enema  . DENTAL SURGERY    . DILATION AND CURETTAGE OF UTERUS     x 2  . surgery for ecotic pregnancy       Social History:   reports that she has never smoked. She has never used smokeless tobacco. She reports that she does not drink alcohol and does not use drugs.   Family History:  Her family history is not on file.   Allergies Allergies  Allergen Reactions  . Lasix [Furosemide] Itching  . Penicillins Hives and Itching    Has patient had a PCN reaction causing immediate rash, facial/tongue/throat swelling, SOB or lightheadedness with hypotension: yes Has patient had a PCN reaction causing severe rash involving mucus membranes or skin necrosis: no Has patient had a PCN reaction that required hospitalization No Has patient had a PCN reaction occurring within the last 10 years: No If all of the above answers are "NO", then may proceed with Cephalosporin use.   Rolland Porter Flavor [Flavoring Agent] Itching, Nausea And Vomiting and Rash    Breaks out in places where she touches strawberries      Home Medications  Prior to Admission medications   Medication Sig Start Date End Date Taking? Authorizing Provider  amLODipine (NORVASC) 10 MG tablet Take 10 mg by mouth daily.  01/29/14   [provider]  aspirin 81 MG tablet Take 81 mg by mouth daily.    [provider]  cetirizine (ZYRTEC) 10 MG tablet Take 10 mg by mouth at bedtime.    [provider]  fenofibrate (TRICOR) 145 MG tablet Take 145 mg by mouth every evening.    [provider]  fluticasone (FLONASE) 50 MCG/ACT nasal spray Place 2 sprays into both nostrils at bedtime. 01/18/17   [provider]  Glucos-Chond-Hyal Ac-Ca Fructo (MOVE FREE JOINT HEALTH ADVANCE) TABS Take 1 tablet by mouth 2 (two) times daily.    [provider]  hyoscyamine (LEVSIN SL) 0.125 MG SL tablet Take 0.25 mg by mouth 3 (three) times daily as needed.  01/29/14    [provider]  Lactobacillus (ACIDOPHILUS PO) Take 1 capsule by mouth daily.    [provider]  Menthol-Zinc Oxide (CALMOSEPTINE EX) Apply 1 application topically 4 (four) times daily as needed (diarrhea).    [provider]  metFORMIN (GLUCOPHAGE) 1000 MG tablet Take 1,000 mg by mouth 2 (two) times daily.  02/23/14   [provider]  metoprolol (LOPRESSOR) 100 MG tablet Take 100  mg by mouth 2 (two) times daily. 02/11/17   [provider]  naproxen sodium (ALEVE) 220 MG tablet Take 440 mg by mouth 2 (two) times daily with a meal.    [provider]  ONE TOUCH ULTRA TEST test strip  12/18/13   [provider]  pioglitazone (ACTOS) 30 MG tablet Take 30 mg by mouth daily.  02/23/14   [provider]  promethazine (PHENERGAN) 25 MG tablet Take 25 mg by mouth 2 (two) times daily as needed for nausea or vomiting.  01/29/14   [provider]  simvastatin (ZOCOR) 20 MG tablet Take 20 mg by mouth at bedtime.  02/23/14   [provider]  TRULICITY 1.5 MG/0.5ML SOPN Inject 1.5 mg into the skin once a week. Sunday 01/18/17   [provider]  valsartan-hydrochlorothiazide (DIOVAN-HCT) 320-25 MG per tablet Take 1 tablet by mouth daily.  01/29/14   [provider]     Critical care time: 33 minutes     Critical care provider statement:    Critical care time (minutes):  33   Critical care time was exclusive of:  Separately billable procedures and  treating other patients   Critical care was necessary to treat or prevent imminent or  life-threatening deterioration of the following conditions:  acute hypoxemic resp failure due to COVID19, multiple comorbid conditions.    Critical care was time spent personally by me on the following  activities:  Development of treatment plan with patient or surrogate,  discussions with consultants, evaluation of patient's response to  treatment, examination of patient,  obtaining history from patient or  surrogate, ordering and performing treatments and interventions, ordering  and review of laboratory studies and re-evaluation of patient's condition   I assumed direction of critical care for this patient from another  provider in my specialty: no     Vida Rigger, M.D.  Pulmonary & Critical Care Medicine  [facility]

## 2021-01-16 NOTE — Progress Notes (Signed)
Medication Reconciliation Report  For Home History Technicians  HIGHLIGHTS:  1. The patient WAS NOT personally interviewed 2. If not, what was the main source used: PHARMACY RECORDS 3. Does the patient appear to take any anti-coagulation agents (e.g. warfarin, Eliquis or Xarelto): NO 4. Does the patient appear to take any anti-convulsant agents (e.g. divalproex, levetiracetam or phenytoin): NO 5. Does the patient appear to use any insulin products (e.g. Lantus, Novolin or Humalog): NO 6. Does the patient appear to take any "beta-blockers" (e.g. metoprolol, carvedilol or bisoprolol: YES  BARRIERS:  1. Were there any barriers that prevented or complicated the medication reconciliation process: YES 2. If yes, what was the primary barrier encountered: Intubation 3. Does the patient appear compliant with prescribed medications: UNABLE TO DETERMINE 4. Does the patient express any barriers with compliance: UNABLE TO DETERMINE 5. What is the primary barrier the patient reports: None   NOTES:[Include any concerns, remarks or complaints the patient expresses regarding medication therapy. Any observations or other information that might be useful to the treatment team can also be included. Immediate needs or concerns should be referred to the RN or appropriate member of the treatment team.]  Patient currently intubated and unable to participate in home medication interview. Also unable to reach family. Pharmacy records appear comprehensive and suggest compliance. Unable to ascertain last doses taken.  Elmo Putt, CPhT [place] [facility] [address] [place], [place] [address] [phone]/2  ** The above is intended solely for informational and/or communicative purposes. It should in no way be considered an endorsement of any specific treatment, therapy or action. **

## 2021-01-17 ENCOUNTER — Encounter: Payer: Self-pay | Admitting: Internal Medicine

## 2021-01-17 ENCOUNTER — Inpatient Hospital Stay: Payer: Managed Care, Other (non HMO)

## 2021-01-17 LAB — CBC
HCT: 35.3 % — ABNORMAL LOW (ref 36.0–46.0)
Hemoglobin: 10.6 g/dL — ABNORMAL LOW (ref 12.0–15.0)
MCH: 26.8 pg (ref 26.0–34.0)
MCHC: 30 g/dL (ref 30.0–36.0)
MCV: 89.1 fL (ref 80.0–100.0)
Platelets: 371 10*3/uL (ref 150–400)
RBC: 3.96 MIL/uL (ref 3.87–5.11)
RDW: 15 % (ref 11.5–15.5)
WBC: 11.4 10*3/uL — ABNORMAL HIGH (ref 4.0–10.5)
nRBC: 0.2 % (ref 0.0–0.2)

## 2021-01-17 LAB — COMPREHENSIVE METABOLIC PANEL
ALT: 17 U/L (ref 0–44)
AST: 24 U/L (ref 15–41)
Albumin: 2.1 g/dL — ABNORMAL LOW (ref 3.5–5.0)
Alkaline Phosphatase: 52 U/L (ref 38–126)
Anion gap: 11 (ref 5–15)
BUN: 42 mg/dL — ABNORMAL HIGH (ref 6–20)
CO2: 29 mmol/L (ref 22–32)
Calcium: 8.6 mg/dL — ABNORMAL LOW (ref 8.9–10.3)
Chloride: 100 mmol/L (ref 98–111)
Creatinine, Ser: 1.85 mg/dL — ABNORMAL HIGH (ref 0.44–1.00)
GFR, Estimated: 32 mL/min — ABNORMAL LOW (ref >60)
Glucose, Bld: 229 mg/dL — ABNORMAL HIGH (ref 70–99)
Potassium: 5.2 mmol/L — ABNORMAL HIGH (ref 3.5–5.1)
Sodium: 140 mmol/L (ref 135–145)
Total Bilirubin: 0.8 mg/dL (ref 0.3–1.2)
Total Protein: 6.1 g/dL — ABNORMAL LOW (ref 6.5–8.1)

## 2021-01-17 LAB — BLOOD GAS, ARTERIAL
Acid-Base Excess: 2.8 mmol/L — ABNORMAL HIGH (ref 0.0–2.0)
Bicarbonate: 31.2 mmol/L — ABNORMAL HIGH (ref 20.0–28.0)
FIO2: 60
MECHVT: 500 mL
Mechanical Rate: 28
O2 Saturation: 91.2 %
PEEP: 10 cmH2O
Patient temperature: 37
RATE: 28 resp/min
pCO2 arterial: 68 mmHg (ref 32.0–48.0)
pH, Arterial: 7.27 — ABNORMAL LOW (ref 7.350–7.450)
pO2, Arterial: 70 mmHg — ABNORMAL LOW (ref 83.0–108.0)

## 2021-01-17 LAB — FIBRIN DERIVATIVES D-DIMER (ARMC ONLY): Fibrin derivatives D-dimer (ARMC): 1172.25 ng/mL (FEU) — ABNORMAL HIGH (ref 0.00–499.00)

## 2021-01-17 LAB — GLUCOSE, CAPILLARY
Glucose-Capillary: 178 mg/dL — ABNORMAL HIGH (ref 70–99)
Glucose-Capillary: 197 mg/dL — ABNORMAL HIGH (ref 70–99)
Glucose-Capillary: 199 mg/dL — ABNORMAL HIGH (ref 70–99)
Glucose-Capillary: 213 mg/dL — ABNORMAL HIGH (ref 70–99)
Glucose-Capillary: 215 mg/dL — ABNORMAL HIGH (ref 70–99)
Glucose-Capillary: 226 mg/dL — ABNORMAL HIGH (ref 70–99)

## 2021-01-17 LAB — C-REACTIVE PROTEIN: CRP: 14.2 mg/dL — ABNORMAL HIGH (ref <1.0)

## 2021-01-17 LAB — FERRITIN: Ferritin: 206 ng/mL (ref 11–307)

## 2021-01-17 LAB — MAGNESIUM: Magnesium: 1.8 mg/dL (ref 1.7–2.4)

## 2021-01-17 LAB — PHOSPHORUS: Phosphorus: 4.5 mg/dL (ref 2.5–4.6)

## 2021-01-17 MED ORDER — SODIUM ZIRCONIUM CYCLOSILICATE 5 G PO PACK
10.0000 g | PACK | Freq: Once | ORAL | Status: AC
  Administered 2021-01-17: 10 g
  Filled 2021-01-17: qty 2

## 2021-01-17 MED ORDER — CHLORHEXIDINE GLUCONATE 0.12% ORAL RINSE (MEDLINE KIT)
15.0000 mL | Freq: Two times a day (BID) | OROMUCOSAL | Status: DC
  Administered 2021-01-17 – 2021-02-01 (×30): 15 mL via OROMUCOSAL

## 2021-01-17 MED ORDER — ORAL CARE MOUTH RINSE
15.0000 mL | OROMUCOSAL | Status: DC
  Administered 2021-01-17 – 2021-02-01 (×144): 15 mL via OROMUCOSAL

## 2021-01-17 NOTE — Consult Note (Addendum)
PHARMACY CONSULT NOTE - FOLLOW UP  Pharmacy Consult for Electrolyte Monitoring and Replacement   Recent Labs: Potassium (mmol/L)  Date Value  01/17/2021 5.2 (H)   Magnesium (mg/dL)  Date Value  35/68/6168 1.8   Calcium (mg/dL)  Date Value  37/29/0211 8.6 (L)   Albumin (g/dL)  Date Value  15/52/0802 2.1 (L)   Phosphorus (mg/dL)  Date Value  23/36/1224 4.5   Sodium (mmol/L)  Date Value  01/17/2021 140   Assessment: 55 yo F with COVID-19. PMH includes HTN, diabetes, and IBS. Pt intubated 2/4. Pharmacy consulted for electrolyte monitoring and replacement.   K 5.2, phos 4.5, Mg 1.8, Na 140, corrected Ca 10.1  Goal of Therapy:  Electrolytes WNL  Plan:  K 5.2 - Lokelma 10 mg daily ordered All other electrolytes WNL Monitor with AM labs  Reatha Armour, PharmD Pharmacy Resident  01/17/2021 11:19 AM

## 2021-01-17 NOTE — Progress Notes (Signed)
NAME:  Dana Bruce, MRN:  [id number], DOB:  February 02, 1966, LOS: 3 ADMISSION DATE:  01/16/2021, CONSULTATION DATE:  01/20/2021 REFERRING MD:  Dr. Fuller Plan,  CHIEF COMPLAINT: Shortness of breath  Brief History:  55 year old unvaccinated female admitted with acute hypoxic respiratory failure in the setting of COVID-19 pneumonia  History of Present Illness:  Dana Bruce is a 55 year old female with a past medical history as listed below who presents to [facility] ED on 01/20/2021 with complaints of shortness of breath.  She reports she began having Covid-like symptoms (malaise, body aches shortness of breath,cough) last Friday 01/08/21.  She tested positive on 01/10/2021.  She reports that her shortness of breath has worsened over the past several days.  She denied chest pain, abdominal pain, vomiting, dysuria, palpitations, dizziness, edema. She is unvaccinated for COVID-19.  ED course: Upon presentation to the ED she was noted to be severely hypoxic with O2 saturations 50% on room air, tachypneic, and tachycardic.  Vitals included: Temperature 97.4 F, respiratory rate 20, pulse 110, blood pressure 124/85.  She was placed on 100% FiO2 via heated 5 flow nasal cannula at 35 L with improvement in O2 saturations.  Work-up reveals glucose 261, BUN 19, creatinine 1.03, LDH 229, high-sensitivity troponin 12, ferritin 205, WBC 12.9, lactic acid 1.6, procalcitonin 0.21, fibrinogen 623, fibrin derivative products 1156.  Chest x-ray shows diffuse bilateral groundglass opacities consistent with COVID-19 pneumonia. She meets SIRS criteria given her tachycardia, tachypnea, and WBC of 12.9.  PCCM is asked to admit the patient to ICU for further work-up and treatment of acute hypoxic respiratory failure in the setting of COVID-19 pneumonia.  She is high risk for decompensation and  need for intubation.   Subjective Findings:  01/15/2021- patient with worsening resp distress s/p ETT.  Has been started on lasix. Due to BMI >62  will hold proning. Patient with resp failure s/p ETT - I have updated husband Dana Bruce via phone today. 01/16/2021- patient was awake and had attempted to self extubate, sedation was increased. Midline was placed due to inadequate access no need for central line at this time. AM labs with worsening GFR, hyperglycemia.  Vitals stable. UOP adequate appx 1L overnight, CXR with bilateral multifocal infiltrates. Ventilator management performed.  01/17/2021- patient has been weaned on MV from FIO2-100% to 70%, repeat ABG this afternoon.  Called husband today (POA Dana Bruce) reviewed care plan and answered questions.   Past Medical History:  Hypertension Diabetes mellitus Arthritis Goiter IBS    Consults:  PCCM  Procedures:  N/A  Significant Diagnostic Tests:  2/3: Chest x-ray>>IMPRESSION: Diffuse bilateral ground-glass airspace opacities consistent with the patient's history of viral pneumonia.  Micro Data:  1/30: SARS-CoV-2 PCR>> positive 2/3: Blood culture x2>> 2/3: Sputum>> 2/3: Strep pneumo urinary antigen>> 2/3: Legionella urinary antigen>>  Antimicrobials:  Remdesivir 2/3>> Levaquin 2/3>>   Objective   Blood pressure (!) 90/42, pulse 97, temperature 98.42 F (36.9 C), resp. rate (!) 22, height 5\' 7"  (1.702 m), weight (!) 179.6 kg, SpO2 95 %.    Vent Mode: PRVC FiO2 (%):  [70 %-85 %] 70 % Set Rate:  [24 bmp] 24 bmp Vt Set:  [500 mL] 500 mL PEEP:  [10 cmH20] 10 cmH20 Plateau Pressure:  [22 cmH20-24 cmH20] 22 cmH20   Intake/Output Summary (Last 24 hours) at 01/17/2021 1004 Last data filed at 01/17/2021 0853 Gross per 24 hour  Intake 2173.9 ml  Output 690 ml  Net 1483.9 ml   Filed Weights   01/29/2021 1441 02/04/2021  1508 01/15/21 0420  Weight: (!) 204.1 kg (!) 187.8 kg (!) 179.6 kg    Examination: General: Morbidly obese Age appropreiate.  On MV sedated HENT: Atraumatic, normocephalic, neck supple, no JVD Lungs: rhonchi + b/l with MV sounds in back Cardiovascular: no MRG  appreciated NST Abdomen: severely Obese, +BS Extremities: No deformities, no edema Neuro: Sedated GCS4T GU: Deferred Skin: Warm and dry.  No obvious rashes, lesions, ulcerations  IMAGING      Assessment & Plan:   Acute Hypoxic Respiratory Failure in the setting of COVID-19 Pneumonia -Supplemental O2 as needed to maintain O2 saturations greater than 88% -Currently s/p I intubation On MV - PRVC 100% with weaning per RT -Full ventilator support -follow NIH ARDS net protocol high PEEP ladder -Remdesivir, plan for 5 days -Steroids (Solu-Medrol 60 mg twice daily)>>>40bid -01/16/2021 -Follow inflammatory markers: Ferritin, D-dimer, CRP -Vitamin C, zinc -Plan to repeat and check resp cultures -Antitussives -Bronchodilators PRN via MDI -Maintain euvolemia to net negative fluid balance as able -Holding proning for now -Incentive spirometry and flutter valve -Maintain airborne and contact precautions            -tracheal aspirate ordered - 01/15/2021    Diabetes mellitus with severe hyperglycemia -CBGs -Sliding scale insulin -Follow ICU hypo/hyperglycemia protocol          -Levemir increased to 14 BID -01/15/2021  Best practice (evaluated daily)  Diet: NPO for now given respiratory status Pain/Anxiety/Delirium protocol (if indicated): N/A VAP protocol (if indicated): N/A DVT prophylaxis: Lovenox subcu GI prophylaxis: Pepcid Glucose control: Sliding scale insulin Mobility: As tolerated Disposition: ICU  Goals of Care:   Code Status: Full code  Labs   CBC: Recent Labs  Lab 2021-02-10 1441 01/15/21 0457 01/16/21 0220 01/17/21 0429  WBC 12.9* 10.0 11.0* 11.4*  HGB 12.1 12.5 11.7* 10.6*  HCT 39.4 40.0 39.4 35.3*  MCV 86.8 85.7 89.1 89.1  PLT 284 285 357 371    Basic Metabolic Panel: Recent Labs  Lab February 10, 2021 1457 01/15/21 0457 01/16/21 0220 01/17/21 0429  NA 139 138 140 140  K 4.2 4.3 4.9 5.2*  CL 101 99 100 100  CO2 25 24 28 29   GLUCOSE 261* 330* 326* 229*   BUN 19 18 30* 42*  CREATININE 1.03* 1.01* 1.49* 1.85*  CALCIUM 9.9 9.3 9.0 8.6*  MG  --   --   --  1.8  PHOS  --   --   --  4.5   GFR: Estimated Creatinine Clearance: 59 mL/min (A) (by C-G formula based on SCr of 1.85 mg/dL (H)). Recent Labs  Lab 02-10-21 1441 2021/02/10 1457 01/15/21 0457 01/16/21 0220 01/17/21 0429  PROCALCITON  --  0.21 0.15 0.16  --   WBC 12.9*  --  10.0 11.0* 11.4*  LATICACIDVEN 1.6  --   --   --   --     Liver Function Tests: Recent Labs  Lab 2021/02/10 1457 01/15/21 0457 01/16/21 0220 01/17/21 0429  AST 40 33 33 24  ALT 25 22 22 17   ALKPHOS 58 60 63 52  BILITOT 0.9 1.1 0.8 0.8  PROT 7.2 6.9 6.7 6.1*  ALBUMIN 2.8* 2.4* 2.4* 2.1*   No results for input(s): LIPASE, AMYLASE in the last 168 hours. No results for input(s): AMMONIA in the last 168 hours.  ABG    Component Value Date/Time   PHART 7.26 (L) 01/17/2021 0350   PCO2ART 70 (HH) 01/17/2021 0350   PO2ART 111 (H) 01/17/2021 0350   HCO3 31.4 (H)  01/17/2021 0350   ACIDBASEDEF 0.8 01/15/2021 1133   O2SAT 97.6 01/17/2021 0350     Coagulation Profile: No results for input(s): INR, PROTIME in the last 168 hours.  Cardiac Enzymes: No results for input(s): CKTOTAL, CKMB, CKMBINDEX, TROPONINI in the last 168 hours.  HbA1C: Hgb A1c MFr Bld  Date/Time Value Ref Range Status  02-09-2021 02:41 PM 6.3 (H) 4.8 - 5.6 % Final    Comment:    (NOTE) Pre diabetes:          5.7%-6.4%  Diabetes:              >6.4%  Glycemic control for   <7.0% adults with diabetes     CBG: Recent Labs  Lab 01/16/21 1552 01/16/21 1919 01/16/21 2322 01/17/21 0414 01/17/21 0734  GLUCAP 253* 285* 211* 213* 215*    Review of Systems:    Unable to complete while on MV sedated to RASS-3  Past Medical History:  She,  has a past medical history of Arthritis, Diabetes mellitus without complication (HCC), Family history of adverse reaction to anesthesia, Goiter, Hypertension, IBS (irritable bowel syndrome),  and PONV (postoperative nausea and vomiting).   Surgical History:   Past Surgical History:  Procedure Laterality Date  . ABDOMINAL HYSTERECTOMY     partial  . CHOLECYSTECTOMY    . COLONOSCOPY WITH PROPOFOL N/A 05/17/2017   Procedure: COLONOSCOPY WITH PROPOFOL;  Surgeon: Willis Modena, MD;  Location: [facility];  Service: Endoscopy;  Laterality: N/A;  . colonscopy  1995 or 1996   with barium enema  . DENTAL SURGERY    . DILATION AND CURETTAGE OF UTERUS     x 2  . surgery for ecotic pregnancy       Social History:   reports that she has never smoked. She has never used smokeless tobacco. She reports that she does not drink alcohol and does not use drugs.   Family History:  Her family history is not on file.   Allergies Allergies  Allergen Reactions  . Lasix [Furosemide] Itching  . Penicillins Hives and Itching    Has patient had a PCN reaction causing immediate rash, facial/tongue/throat swelling, SOB or lightheadedness with hypotension: yes Has patient had a PCN reaction causing severe rash involving mucus membranes or skin necrosis: no Has patient had a PCN reaction that required hospitalization No Has patient had a PCN reaction occurring within the last 10 years: No If all of the above answers are "NO", then may proceed with Cephalosporin use.   Rolland Porter Flavor [Flavoring Agent] Itching, Nausea And Vomiting and Rash    Breaks out in places where she touches strawberries      Home Medications  Prior to Admission medications   Medication Sig Start Date End Date Taking? Authorizing Provider  amLODipine (NORVASC) 10 MG tablet Take 10 mg by mouth daily.  01/29/14   [provider]  aspirin 81 MG tablet Take 81 mg by mouth daily.    [provider]  cetirizine (ZYRTEC) 10 MG tablet Take 10 mg by mouth at bedtime.    [provider]  fenofibrate (TRICOR) 145 MG tablet Take 145 mg by mouth every evening.    [provider]  fluticasone  (FLONASE) 50 MCG/ACT nasal spray Place 2 sprays into both nostrils at bedtime. 01/18/17   [provider]  Glucos-Chond-Hyal Ac-Ca Fructo (MOVE FREE JOINT HEALTH ADVANCE) TABS Take 1 tablet by mouth 2 (two) times daily.    [provider]  hyoscyamine (LEVSIN  SL) 0.125 MG SL tablet Take 0.25 mg by mouth 3 (three) times daily as needed.  01/29/14   [provider]  Lactobacillus (ACIDOPHILUS PO) Take 1 capsule by mouth daily.    [provider]  Menthol-Zinc Oxide (CALMOSEPTINE EX) Apply 1 application topically 4 (four) times daily as needed (diarrhea).    [provider]  metFORMIN (GLUCOPHAGE) 1000 MG tablet Take 1,000 mg by mouth 2 (two) times daily.  02/23/14   [provider]  metoprolol (LOPRESSOR) 100 MG tablet Take 100 mg by mouth 2 (two) times daily. 02/11/17   [provider]  naproxen sodium (ALEVE) 220 MG tablet Take 440 mg by mouth 2 (two) times daily with a meal.    [provider]  ONE TOUCH ULTRA TEST test strip  12/18/13   [provider]  pioglitazone (ACTOS) 30 MG tablet Take 30 mg by mouth daily.  02/23/14   [provider]  promethazine (PHENERGAN) 25 MG tablet Take 25 mg by mouth 2 (two) times daily as needed for nausea or vomiting.  01/29/14   [provider]  simvastatin (ZOCOR) 20 MG tablet Take 20 mg by mouth at bedtime.  02/23/14   [provider]  TRULICITY 1.5 MG/0.5ML SOPN Inject 1.5 mg into the skin once a week. Sunday 01/18/17   [provider]  valsartan-hydrochlorothiazide (DIOVAN-HCT) 320-25 MG per tablet Take 1 tablet by mouth daily.  01/29/14   [provider]     Critical care time: 33 minutes     Critical care provider statement:    Critical care time (minutes):  33   Critical care time was exclusive of:  Separately billable procedures and  treating other patients   Critical care was necessary to treat or prevent imminent or  life-threatening  deterioration of the following conditions:  acute hypoxemic resp failure due to COVID19, multiple comorbid conditions.    Critical care was time spent personally by me on the following  activities:  Development of treatment plan with patient or surrogate,  discussions with consultants, evaluation of patient's response to  treatment, examination of patient, obtaining history from patient or  surrogate, ordering and performing treatments and interventions, ordering  and review of laboratory studies and re-evaluation of patient's condition   I assumed direction of critical care for this patient from another  provider in my specialty: no     Vida Rigger, M.D.  Pulmonary & Critical Care Medicine  [facility]

## 2021-01-18 DIAGNOSIS — U071 COVID-19: Secondary | ICD-10-CM | POA: Diagnosis not present

## 2021-01-18 LAB — CBC
HCT: 34.3 % — ABNORMAL LOW (ref 36.0–46.0)
Hemoglobin: 10.5 g/dL — ABNORMAL LOW (ref 12.0–15.0)
MCH: 26.7 pg (ref 26.0–34.0)
MCHC: 30.6 g/dL (ref 30.0–36.0)
MCV: 87.3 fL (ref 80.0–100.0)
Platelets: 290 10*3/uL (ref 150–400)
RBC: 3.93 MIL/uL (ref 3.87–5.11)
RDW: 15 % (ref 11.5–15.5)
WBC: 9.9 10*3/uL (ref 4.0–10.5)
nRBC: 0 % (ref 0.0–0.2)

## 2021-01-18 LAB — GLUCOSE, CAPILLARY
Glucose-Capillary: 287 mg/dL — ABNORMAL HIGH (ref 70–99)
Glucose-Capillary: 204 mg/dL — ABNORMAL HIGH (ref 70–99)
Glucose-Capillary: 178 mg/dL — ABNORMAL HIGH (ref 70–99)
Glucose-Capillary: 232 mg/dL — ABNORMAL HIGH (ref 70–99)
Glucose-Capillary: 207 mg/dL — ABNORMAL HIGH (ref 70–99)
Glucose-Capillary: 200 mg/dL — ABNORMAL HIGH (ref 70–99)
Glucose-Capillary: 193 mg/dL — ABNORMAL HIGH (ref 70–99)

## 2021-01-18 LAB — COMPREHENSIVE METABOLIC PANEL
ALT: 16 U/L (ref 0–44)
AST: 18 U/L (ref 15–41)
Albumin: 2.2 g/dL — ABNORMAL LOW (ref 3.5–5.0)
Alkaline Phosphatase: 43 U/L (ref 38–126)
Anion gap: 9 (ref 5–15)
BUN: 60 mg/dL — ABNORMAL HIGH (ref 6–20)
CO2: 27 mmol/L (ref 22–32)
Calcium: 8 mg/dL — ABNORMAL LOW (ref 8.9–10.3)
Chloride: 103 mmol/L (ref 98–111)
Creatinine, Ser: 2.05 mg/dL — ABNORMAL HIGH (ref 0.44–1.00)
GFR, Estimated: 28 mL/min — ABNORMAL LOW (ref >60)
Glucose, Bld: 283 mg/dL — ABNORMAL HIGH (ref 70–99)
Potassium: 4.5 mmol/L (ref 3.5–5.1)
Sodium: 139 mmol/L (ref 135–145)
Total Bilirubin: 1 mg/dL (ref 0.3–1.2)
Total Protein: 5.7 g/dL — ABNORMAL LOW (ref 6.5–8.1)

## 2021-01-18 LAB — CULTURE, RESPIRATORY W GRAM STAIN: Culture: NORMAL

## 2021-01-18 LAB — MAGNESIUM: Magnesium: 1.7 mg/dL (ref 1.7–2.4)

## 2021-01-18 LAB — PHOSPHORUS: Phosphorus: 3.4 mg/dL (ref 2.5–4.6)

## 2021-01-18 MED ORDER — VITAL AF 1.2 CAL PO LIQD
1000.0000 mL | ORAL | Status: DC
  Administered 2021-01-18 – 2021-01-19 (×2): 1000 mL

## 2021-01-18 MED ORDER — ADULT MULTIVITAMIN LIQUID CH
15.0000 mL | Freq: Every day | ORAL | Status: DC
  Administered 2021-01-19 – 2021-01-20 (×2): 15 mL
  Filled 2021-01-18 (×2): qty 15

## 2021-01-18 MED ORDER — PROSOURCE TF PO LIQD
90.0000 mL | Freq: Every day | ORAL | Status: DC
  Administered 2021-01-18 – 2021-01-20 (×8): 90 mL
  Filled 2021-01-18 (×13): qty 90

## 2021-01-18 MED ORDER — MAGNESIUM SULFATE 2 GM/50ML IV SOLN
2.0000 g | Freq: Once | INTRAVENOUS | Status: AC
  Administered 2021-01-18: 2 g via INTRAVENOUS
  Filled 2021-01-18: qty 50

## 2021-01-18 NOTE — Progress Notes (Signed)
Assisted tele visit to patient with daughter.  Glennie Bose M Rhema Boyett, RN   

## 2021-01-18 NOTE — Consult Note (Signed)
PHARMACY CONSULT NOTE - FOLLOW UP  Pharmacy Consult for Electrolyte Monitoring and Replacement   Recent Labs: Potassium (mmol/L)  Date Value  01/18/2021 4.5   Magnesium (mg/dL)  Date Value  28/31/5176 1.7   Calcium (mg/dL)  Date Value  16/06/3709 8.0 (L)   Albumin (g/dL)  Date Value  62/69/4854 2.2 (L)   Phosphorus (mg/dL)  Date Value  62/70/3500 3.4   Sodium (mmol/L)  Date Value  01/18/2021 139   Assessment: 55 yo F with COVID-19. PMH includes HTN, diabetes, and IBS. Pt intubated 2/4. Pharmacy consulted for electrolyte monitoring and replacement.   Goal of Therapy:  Electrolytes WNL  Plan:  Mag 2 g IV x 1. All other electrolytes WNL. Follow up with morning labs.  Pricilla Riffle, PharmD Clinical Pharmacist 01/18/2021 11:38 AM

## 2021-01-18 NOTE — Progress Notes (Signed)
Inpatient Diabetes Program Recommendations  AACE/ADA: New Consensus Statement on Inpatient Glycemic Control  Target Ranges:  Prepandial:   less than 140 mg/dL      Peak postprandial:   less than 180 mg/dL (1-2 hours)      Critically ill patients:  140 - 180 mg/dL  Results for Dana Bruce, Dana Bruce (MRN [id number]) as of 01/18/2021 13:32  Ref. Range 01/17/2021 07:34 01/17/2021 11:38 01/17/2021 15:57 01/17/2021 19:45 01/17/2021 23:47 01/18/2021 04:04 01/18/2021 08:01 01/18/2021 11:45  Glucose-Capillary Latest Ref Range: 70 - 99 mg/dL 196 (H) 222 (H) 979 (H) 197 (H) 178 (H) 287 (H) 204 (H) 178 (H)    Review of Glycemic Control  Diabetes history: DM2 Outpatient Diabetes medications: Metformin 1000 mg BID, Actos 30 mg daily, Trulicity 1.5 mg Qweek Current orders for Inpatient glycemic control: Levemir 14 units daily, Novolog 0-20 units Q4H; Solumedrol 40 mg Q12H, Vital @ 20 ml/hr  Inpatient Diabetes Program Recommendations:    Insulin: If steroids will be continued as ordered, please consider increasing Levemir to 14 units BID.   Thanks, Orlando Penner, RN, MSN, CDE Diabetes Coordinator Inpatient Diabetes Program [phone] (Team Pager from 8am to 5pm)

## 2021-01-18 NOTE — Progress Notes (Signed)
Nutrition Follow Up Note   DOCUMENTATION CODES:   Morbid obesity  INTERVENTION:   Initiate Vital 1.2 @20ml /hr + Pro-Source TF 19m- 5 times daily via tube  Free water flushes 310mq4 hours to maintain tube patency   Propofol: 53.9 ml/hr- provides 1422kcal/day   Regimen provides 2398kcal/day, 146g/day protein and 56964may of free water   Liquid MVI daily via tube   Pt at high refeed risk; recommend monitor potassium, magnesium and phosphorus labs daily until stable  NUTRITION DIAGNOSIS:   Inadequate oral intake related to inability to eat (pt sedated and ventilated) as evidenced by NPO status.  GOAL:   Provide needs based on ASPEN/SCCM guidelines  -not met   MONITOR:   Vent status,Labs,Weight trends,Skin,I & O's, tube feeds   ASSESSMENT:   [age] female with h/o DM, HTN, IBS, DM, goiter and OSA who is admitted with COVID 19.   Pt sedated and ventilated. OGT in place. Plan is to start feeds today. No BM since admit.   Medications reviewed and include: vitamin C, coalce, lovenox, insulin, solu-medrol, miralax, fentanyl, pepcid, propofol   Labs reviewed: creat 1.01(H) cbgs- 276, 288, 307 x 24 hrs AIC 6.3(H)- 2/3  Patient is currently intubated on ventilator support MV: 15.4 L/min Temp (24hrs), Avg:99.3 F (37.4 C), Min:98.96 F (37.2 C), Max:99.5 F (37.5 C)  Propofol: 53.9 ml/hr- provides 1422kcal/day   MAP- >75m80m UOP- 1015ml28miet Order:   Diet Order            Diet NPO time specified  Diet effective now                EDUCATION NEEDS:   No education needs have been identified at this time  Skin:  Skin Assessment: Reviewed RN Assessment  Last BM:  PTA  Height:   Ht Readings from Last 1 Encounters:  02/06/2021 5' 7"  (1.702 m)    Weight:   Wt Readings from Last 1 Encounters:  01/15/21 (!) 179.6 kg    Ideal Body Weight:  61.36 kg  BMI:  Body mass index is 62.01 kg/m.  Estimated Nutritional Needs:   Kcal:   2173kcal/day  Protein:  125-150g/day  Fluid:  1.9-2.2L/day  CaseyKoleen DistanceRD, LDN Please refer to [facility]IONAd[facility]uchulaRD and/or RD on-call/weekend/after hours pager

## 2021-01-18 NOTE — Progress Notes (Signed)
NAME:  Dana Bruce, MRN:  [id number], DOB:  1965/12/29, LOS: 4 ADMISSION DATE:  01/12/2021, CONSULTATION DATE:  01/26/2021 REFERRING MD:  Dr. Jari Pigg,  CHIEF COMPLAINT: Shortness of breath  Brief History:  55 year old unvaccinated female admitted with acute hypoxic respiratory failure in the setting of COVID-19 pneumonia   Subjective Findings:  01/15/2021- patient with worsening resp distress s/p ETT.  Has been started on lasix. Due to BMI >62 will hold proning. Patient with resp failure s/p ETT - I have updated husband [name] via phone today. 01/16/2021- patient was awake and had attempted to self extubate, sedation was increased. Midline was placed due to inadequate access no need for central line at this time. AM labs with worsening GFR, hyperglycemia.  Vitals stable. UOP adequate appx 1L overnight, CXR with bilateral multifocal infiltrates. Ventilator management performed.  01/17/2021- patient has been weaned on MV from FIO2-100% to 70%, repeat ABG this afternoon.  Called husband today (POA Kalisi Bevill) reviewed care plan and answered questions.   Past Medical History:  Hypertension Diabetes mellitus Arthritis Goiter IBS    Consults:  PCCM  Procedures:  N/A  Significant Diagnostic Tests:  2/3: Chest x-ray>>IMPRESSION: Diffuse bilateral ground-glass airspace opacities consistent with the patient's history of viral pneumonia.  Micro Data:  1/30: SARS-CoV-2 PCR>> positive 2/3: Blood culture x2>> 2/3: Sputum>> 2/3: Strep pneumo urinary antigen>> 2/3: Legionella urinary antigen>>  Antimicrobials:  Remdesivir 2/3>> Levaquin 2/3>>   Objective   Blood pressure 119/65, pulse (!) 116, temperature 99.32 F (37.4 C), resp. rate (!) 28, height 5' 7"  (1.702 m), weight (!) 179.6 kg, SpO2 94 %.    Vent Mode: PRVC FiO2 (%):  [40 %-60 %] 60 % Set Rate:  [30 bmp] 30 bmp Vt Set:  [500 mL] 500 mL PEEP:  [8 cmH20-10 cmH20] 8 cmH20 Plateau Pressure:  [15 cmH20-22 cmH20] 15 cmH20    Intake/Output Summary (Last 24 hours) at 01/18/2021 2044 Last data filed at 01/18/2021 2000 Gross per 24 hour  Intake 2752.24 ml  Output 1840 ml  Net 912.24 ml   Filed Weights   01/27/2021 1441 01/15/2021 1508 01/15/21 0420  Weight: (!) 204.1 kg (!) 187.8 kg (!) 179.6 kg    Examination: General: Morbidly obese Age appropreiate.  On MV sedated HENT: Atraumatic, normocephalic, neck supple, no JVD Lungs: rhonchi + b/l with MV sounds in back Cardiovascular: no MRG appreciated NST Abdomen: severely Obese, +BS Extremities: No deformities, no edema Neuro: Sedated GCS4T GU: Deferred Skin: Warm and dry.  No obvious rashes, lesions, ulcerations  IMAGING   No imaging today.   Assessment & Plan:   Acute Hypoxic Respiratory Failure in the setting of COVID-19 Pneumonia -Supplemental O2 as needed to maintain O2 saturations greater than 88% -Currently s/p I intubation On MV - PRVC 100% with weaning per RT -Full ventilator support -follow  ARDS net protocol high PEEP ladder -Remdesivir, plan for 5 days -Steroids (Solu-Medrol 60 mg twice daily)>>>40bid -01/16/2021 -Follow inflammatory markers: Ferritin, D-dimer, CRP -Vitamin C, zinc -Plan to repeat and check resp cultures -Antitussives -Bronchodilators via MDI vent adapter -Maintain euvolemia to net negative fluid balance as able -Holding proning for now due to body habitus (BMI 62) -Maintain airborne and contact precautions            -tracheal aspirate ordered - 01/15/2021   Diabetes mellitus with severe hyperglycemia -CBGs -Sliding scale insulin -Follow ICU hypo/hyperglycemia protocol         Best practice (evaluated daily)  Diet: Begin trickle feeds Pain/Anxiety/Delirium  protocol (if indicated): N/A VAP protocol (if indicated): N/A DVT prophylaxis: Lovenox subcu GI prophylaxis: Pepcid Glucose control: Sliding scale insulin Mobility: As tolerated Disposition: ICU  Goals of Care:   Code Status: Full code  Labs    CBC: Recent Labs  Lab 01/13/2021 1441 01/15/21 0457 01/16/21 0220 01/17/21 0429 01/18/21 0458  WBC 12.9* 10.0 11.0* 11.4* 9.9  HGB 12.1 12.5 11.7* 10.6* 10.5*  HCT 39.4 40.0 39.4 35.3* 34.3*  MCV 86.8 85.7 89.1 89.1 87.3  PLT 284 285 357 371 790    Basic Metabolic Panel: Recent Labs  Lab 01/26/2021 1457 01/15/21 0457 01/16/21 0220 01/17/21 0429 01/18/21 0458  NA 139 138 140 140 139  K 4.2 4.3 4.9 5.2* 4.5  CL 101 99 100 100 103  CO2 25 24 28 29 27   GLUCOSE 261* 330* 326* 229* 283*  BUN 19 18 30* 42* 60*  CREATININE 1.03* 1.01* 1.49* 1.85* 2.05*  CALCIUM 9.9 9.3 9.0 8.6* 8.0*  MG  --   --   --  1.8 1.7  PHOS  --   --   --  4.5 3.4   GFR: Estimated Creatinine Clearance: 53.3 mL/min (A) (by C-G formula based on SCr of 2.05 mg/dL (H)). Recent Labs  Lab 01/19/2021 1441 02/04/2021 1457 01/15/21 0457 01/16/21 0220 01/17/21 0429 01/18/21 0458  PROCALCITON  --  0.21 0.15 0.16  --   --   WBC 12.9*  --  10.0 11.0* 11.4* 9.9  LATICACIDVEN 1.6  --   --   --   --   --     Liver Function Tests: Recent Labs  Lab 02/05/2021 1457 01/15/21 0457 01/16/21 0220 01/17/21 0429 01/18/21 0458  AST 40 33 33 24 18  ALT 25 22 22 17 16   ALKPHOS 58 60 63 52 43  BILITOT 0.9 1.1 0.8 0.8 1.0  PROT 7.2 6.9 6.7 6.1* 5.7*  ALBUMIN 2.8* 2.4* 2.4* 2.1* 2.2*   No results for input(s): LIPASE, AMYLASE in the last 168 hours. No results for input(s): AMMONIA in the last 168 hours.  ABG    Component Value Date/Time   PHART 7.33 (L) 01/18/2021 0428   PCO2ART 53 (H) 01/18/2021 0428   PO2ART 89 01/18/2021 0428   HCO3 27.9 01/18/2021 0428   ACIDBASEDEF 0.8 01/15/2021 1133   O2SAT 96.2 01/18/2021 0428     Coagulation Profile: No results for input(s): INR, PROTIME in the last 168 hours.  Cardiac Enzymes: No results for input(s): CKTOTAL, CKMB, CKMBINDEX, TROPONINI in the last 168 hours.  HbA1C: Hgb A1c MFr Bld  Date/Time Value Ref Range Status  01/31/2021 02:41 PM 6.3 (H) 4.8 - 5.6 %  Final    Comment:    (NOTE) Pre diabetes:          5.7%-6.4%  Diabetes:              >6.4%  Glycemic control for   <7.0% adults with diabetes     CBG: Recent Labs  Lab 01/18/21 0404 01/18/21 0801 01/18/21 1145 01/18/21 1558 01/18/21 1922  GLUCAP 287* 204* 178* 232* 207*    Review of Systems:    Unable to complete while on MV sedated to RASS-3   Allergies Allergies  Allergen Reactions  . Lasix [Furosemide] Itching  . Penicillins Hives and Itching    Has patient had a PCN reaction causing immediate rash, facial/tongue/throat swelling, SOB or lightheadedness with hypotension: yes Has patient had a PCN reaction causing severe rash involving mucus membranes or skin  necrosis: no Has patient had a PCN reaction that required hospitalization No Has patient had a PCN reaction occurring within the last 10 years: No If all of the above answers are "NO", then may proceed with Cephalosporin use.   [place] Flavor [Flavoring Agent] Itching, Nausea And Vomiting and Rash    Breaks out in places where she touches strawberries      Current medications  Scheduled Meds: . vitamin C  1,000 mg Per Tube TID  . chlorhexidine gluconate (MEDLINE KIT)  15 mL Mouth Rinse BID  . Chlorhexidine Gluconate Cloth  6 each Topical Daily  . docusate  100 mg Per Tube BID  . enoxaparin (LOVENOX) injection  0.5 mg/kg Subcutaneous Q24H  . feeding supplement (PROSource TF)  90 mL Per Tube 5 X Daily  . feeding supplement (VITAL AF 1.2 CAL)  1,000 mL Per Tube Q24H  . insulin aspart  0-20 Units Subcutaneous Q4H  . insulin detemir  14 Units Subcutaneous Daily  . mouth rinse  15 mL Mouth Rinse 10 times per day  . methylPREDNISolone (SOLU-MEDROL) injection  40 mg Intravenous Q12H  . [START ON 01/19/2021] multivitamin  15 mL Per Tube Daily  . polyethylene glycol  17 g Per Tube Daily  . sodium chloride flush  10-40 mL Intracatheter Q12H  . sodium chloride flush  3 mL Intravenous Q12H   Continuous  Infusions: . sodium chloride    . famotidine (PEPCID) IV Stopped (01/18/21 1023)  . fentaNYL infusion INTRAVENOUS 250 mcg/hr (01/18/21 2000)  . levofloxacin (LEVAQUIN) IV Stopped (01/18/21 1833)  . propofol (DIPRIVAN) infusion 60 mcg/kg/min (01/18/21 2012)   PRN Meds:.sodium chloride, acetaminophen (TYLENOL) oral liquid 160 mg/5 mL, albuterol, guaiFENesin-dextromethorphan, midazolam, midazolam, ondansetron (ZOFRAN) IV, sodium chloride flush, sodium chloride flush    Critical care time: 40 minutes    Multidisciplinary rounds were performed with the ICU team.  Prognosis is guarded.    Renold Don, MD [facility]   *This note was dictated using voice recognition software/Dragon.  Despite best efforts to proofread, errors can occur which can change the meaning.  Any change was purely unintentional.

## 2021-01-18 NOTE — Progress Notes (Signed)
Attempted to do camera visit with husband multiple times. Unable to get his phone to connect.

## 2021-01-19 ENCOUNTER — Inpatient Hospital Stay: Payer: Managed Care, Other (non HMO)

## 2021-01-19 DIAGNOSIS — U071 COVID-19: Secondary | ICD-10-CM | POA: Diagnosis not present

## 2021-01-19 LAB — URINALYSIS, COMPLETE (UACMP) WITH MICROSCOPIC
Bilirubin Urine: NEGATIVE
Glucose, UA: NEGATIVE mg/dL
Hgb urine dipstick: NEGATIVE
Ketones, ur: NEGATIVE mg/dL
Leukocytes,Ua: NEGATIVE
Nitrite: NEGATIVE
Protein, ur: NEGATIVE mg/dL
Specific Gravity, Urine: 1.01 (ref 1.005–1.030)
pH: 5 (ref 5.0–8.0)

## 2021-01-19 LAB — CULTURE, BLOOD (ROUTINE X 2)
Culture: NO GROWTH
Culture: NO GROWTH
Special Requests: ADEQUATE

## 2021-01-19 LAB — COMPREHENSIVE METABOLIC PANEL
ALT: 14 U/L (ref 0–44)
AST: 14 U/L — ABNORMAL LOW (ref 15–41)
Albumin: 2.1 g/dL — ABNORMAL LOW (ref 3.5–5.0)
Alkaline Phosphatase: 44 U/L (ref 38–126)
Anion gap: 12 (ref 5–15)
BUN: 69 mg/dL — ABNORMAL HIGH (ref 6–20)
CO2: 26 mmol/L (ref 22–32)
Calcium: 8.2 mg/dL — ABNORMAL LOW (ref 8.9–10.3)
Chloride: 102 mmol/L (ref 98–111)
Creatinine, Ser: 1.66 mg/dL — ABNORMAL HIGH (ref 0.44–1.00)
GFR, Estimated: 36 mL/min — ABNORMAL LOW (ref >60)
Glucose, Bld: 228 mg/dL — ABNORMAL HIGH (ref 70–99)
Potassium: 4.6 mmol/L (ref 3.5–5.1)
Sodium: 140 mmol/L (ref 135–145)
Total Bilirubin: 0.9 mg/dL (ref 0.3–1.2)
Total Protein: 5.9 g/dL — ABNORMAL LOW (ref 6.5–8.1)

## 2021-01-19 LAB — CBC
HCT: 34.7 % — ABNORMAL LOW (ref 36.0–46.0)
Hemoglobin: 10.9 g/dL — ABNORMAL LOW (ref 12.0–15.0)
MCH: 27 pg (ref 26.0–34.0)
MCHC: 31.4 g/dL (ref 30.0–36.0)
MCV: 85.9 fL (ref 80.0–100.0)
Platelets: 303 10*3/uL (ref 150–400)
RBC: 4.04 MIL/uL (ref 3.87–5.11)
RDW: 14.9 % (ref 11.5–15.5)
WBC: 9.3 10*3/uL (ref 4.0–10.5)
nRBC: 0.2 % (ref 0.0–0.2)

## 2021-01-19 LAB — GLUCOSE, CAPILLARY
Glucose-Capillary: 179 mg/dL — ABNORMAL HIGH (ref 70–99)
Glucose-Capillary: 202 mg/dL — ABNORMAL HIGH (ref 70–99)
Glucose-Capillary: 221 mg/dL — ABNORMAL HIGH (ref 70–99)
Glucose-Capillary: 223 mg/dL — ABNORMAL HIGH (ref 70–99)
Glucose-Capillary: 239 mg/dL — ABNORMAL HIGH (ref 70–99)
Glucose-Capillary: 245 mg/dL — ABNORMAL HIGH (ref 70–99)

## 2021-01-19 LAB — C-REACTIVE PROTEIN: CRP: 9.1 mg/dL — ABNORMAL HIGH (ref <1.0)

## 2021-01-19 LAB — FIBRIN DERIVATIVES D-DIMER (ARMC ONLY): Fibrin derivatives D-dimer (ARMC): 1066.54 ng/mL (FEU) — ABNORMAL HIGH (ref 0.00–499.00)

## 2021-01-19 LAB — TRIGLYCERIDES: Triglycerides: 709 mg/dL — ABNORMAL HIGH (ref <150)

## 2021-01-19 LAB — FERRITIN: Ferritin: 111 ng/mL (ref 11–307)

## 2021-01-19 MED ORDER — LABETALOL HCL 5 MG/ML IV SOLN
10.0000 mg | INTRAVENOUS | Status: DC | PRN
  Administered 2021-01-19 – 2021-01-22 (×2): 10 mg via INTRAVENOUS
  Filled 2021-01-19 (×3): qty 4

## 2021-01-19 MED ORDER — DEXMEDETOMIDINE HCL IN NACL 400 MCG/100ML IV SOLN
0.4000 ug/kg/h | INTRAVENOUS | Status: DC
  Administered 2021-01-19 (×3): 1.2 ug/kg/h via INTRAVENOUS
  Administered 2021-01-19: 0.4 ug/kg/h via INTRAVENOUS
  Administered 2021-01-19 – 2021-01-20 (×5): 1.2 ug/kg/h via INTRAVENOUS
  Administered 2021-01-20: 0.8 ug/kg/h via INTRAVENOUS
  Administered 2021-01-20: 1 ug/kg/h via INTRAVENOUS
  Administered 2021-01-20 (×2): 1.2 ug/kg/h via INTRAVENOUS
  Administered 2021-01-20: 1 ug/kg/h via INTRAVENOUS
  Administered 2021-01-20 (×2): 1.2 ug/kg/h via INTRAVENOUS
  Administered 2021-01-20: 1 ug/kg/h via INTRAVENOUS
  Administered 2021-01-21: 1.2 ug/kg/h via INTRAVENOUS
  Administered 2021-01-21: 1 ug/kg/h via INTRAVENOUS
  Administered 2021-01-21: 0.9 ug/kg/h via INTRAVENOUS
  Administered 2021-01-21: 1.2 ug/kg/h via INTRAVENOUS
  Administered 2021-01-21: 1 ug/kg/h via INTRAVENOUS
  Administered 2021-01-21: 1.2 ug/kg/h via INTRAVENOUS
  Administered 2021-01-21: 1.1 ug/kg/h via INTRAVENOUS
  Administered 2021-01-21: 1 ug/kg/h via INTRAVENOUS
  Administered 2021-01-21: 1.2 ug/kg/h via INTRAVENOUS
  Administered 2021-01-21: 1 ug/kg/h via INTRAVENOUS
  Administered 2021-01-21 – 2021-01-22 (×3): 1.2 ug/kg/h via INTRAVENOUS
  Administered 2021-01-22 (×2): 1 ug/kg/h via INTRAVENOUS
  Administered 2021-01-22: 1.2 ug/kg/h via INTRAVENOUS
  Administered 2021-01-22: 1 ug/kg/h via INTRAVENOUS
  Administered 2021-01-22 (×2): 1.2 ug/kg/h via INTRAVENOUS
  Administered 2021-01-22 (×2): 0.6 ug/kg/h via INTRAVENOUS
  Administered 2021-01-23 (×3): 1 ug/kg/h via INTRAVENOUS
  Filled 2021-01-19 (×17): qty 100
  Filled 2021-01-19: qty 200
  Filled 2021-01-19 (×25): qty 100

## 2021-01-19 MED ORDER — FENTANYL BOLUS VIA INFUSION
100.0000 ug | Freq: Once | INTRAVENOUS | Status: AC
  Administered 2021-01-19: 100 ug via INTRAVENOUS
  Filled 2021-01-19: qty 100

## 2021-01-19 MED ORDER — VANCOMYCIN HCL 2000 MG/400ML IV SOLN
2000.0000 mg | Freq: Once | INTRAVENOUS | Status: AC
  Administered 2021-01-20: 2000 mg via INTRAVENOUS
  Filled 2021-01-19: qty 400

## 2021-01-19 MED ORDER — BISACODYL 10 MG RE SUPP
10.0000 mg | Freq: Once | RECTAL | Status: AC
  Administered 2021-01-19: 10 mg via RECTAL
  Filled 2021-01-19: qty 1

## 2021-01-19 MED ORDER — SENNOSIDES-DOCUSATE SODIUM 8.6-50 MG PO TABS
2.0000 | ORAL_TABLET | Freq: Two times a day (BID) | ORAL | Status: DC
  Administered 2021-01-20 – 2021-01-26 (×7): 2
  Filled 2021-01-19 (×9): qty 2

## 2021-01-19 MED ORDER — FENTANYL CITRATE (PF) 2500 MCG/50ML IJ SOLN
0.0000 ug/h | Status: DC
  Administered 2021-01-19: 300 ug/h via INTRAVENOUS
  Administered 2021-01-20: 50 ug/h via INTRAVENOUS
  Filled 2021-01-19 (×4): qty 100

## 2021-01-19 MED ORDER — FREE WATER
200.0000 mL | Status: DC
  Administered 2021-01-19 – 2021-01-27 (×49): 200 mL

## 2021-01-19 MED ORDER — BUMETANIDE 0.25 MG/ML IJ SOLN
1.0000 mg | Freq: Once | INTRAMUSCULAR | Status: AC
  Administered 2021-01-19: 1 mg via INTRAVENOUS
  Filled 2021-01-19 (×2): qty 4

## 2021-01-19 MED ORDER — VANCOMYCIN HCL 1250 MG/250ML IV SOLN
1250.0000 mg | Freq: Two times a day (BID) | INTRAVENOUS | Status: DC
  Filled 2021-01-19 (×2): qty 250

## 2021-01-19 NOTE — Consult Note (Signed)
Pharmacy Antibiotic Note  Dana Bruce is a 55 y.o. female admitted on 01/25/2021 with pneumonia.  Pharmacy has been consulted for Vancomycin dosing.  Plan: Vancomycin 2g x1; then Vancomycin 1250mg  q12h AUC: 471.9; Cmax: 27.4; Cmin: 14.8 on Scr: 1.66 Vd: 0.5; CrCl per ABW.  S/p 5days of levofloxacin 750mg  q24h  Height: 5\' 7"  (170.2 cm) Weight: (!) 179.6 kg (395 lb 15.1 oz) IBW/kg (Calculated) : 61.6  Temp (24hrs), Avg:99.7 F (37.6 C), Min:98.24 F (36.8 C), Max:102.2 F (39 C)  Recent Labs  Lab 01/20/2021 1441 01/28/2021 1457 01/15/21 0457 01/16/21 0220 01/17/21 0429 01/18/21 0458 01/19/21 0732  WBC 12.9*  --  10.0 11.0* 11.4* 9.9 9.3  CREATININE  --    < > 1.01* 1.49* 1.85* 2.05* 1.66*  LATICACIDVEN 1.6  --   --   --   --   --   --    < > = values in this interval not displayed.    Estimated Creatinine Clearance: 65.8 mL/min (A) (by C-G formula based on SCr of 1.66 mg/dL (H)).    Allergies  Allergen Reactions  . Lasix [Furosemide] Itching  . Penicillins Hives and Itching    Has patient had a PCN reaction causing immediate rash, facial/tongue/throat swelling, SOB or lightheadedness with hypotension: yes Has patient had a PCN reaction causing severe rash involving mucus membranes or skin necrosis: no Has patient had a PCN reaction that required hospitalization No Has patient had a PCN reaction occurring within the last 10 years: No If all of the above answers are "NO", then may proceed with Cephalosporin use.   03/17/21 Flavor [Flavoring Agent] Itching, Nausea And Vomiting and Rash    Breaks out in places where she touches strawberries     Antimicrobials this admission: Vancomycin (2/8 >> ongoing Levofloxacin (2/4 >> 2/8)  Microbiology results: 2/3 BCx: NG5D 2/4 Sputum: sent/pending  2/8 BCx: sent/ pending  Thank you for allowing pharmacy to be a part of this patient's care.  11-25-1985 01/19/2021 6:31 PM

## 2021-01-19 NOTE — Progress Notes (Signed)
NAME:  Dana Bruce, MRN:  [id number], DOB:  [birth year], LOS: 5 ADMISSION DATE:  02/03/2021, INITIAL CONSULTATION DATE:  02/06/2021 REFERRING MD:  Dr. Jari Pigg,  CHIEF COMPLAINT: Shortness of breath  Brief History:  55 year old unvaccinated female admitted with acute hypoxic respiratory failure in the setting of COVID-19 pneumonia   Subjective Findings:  01/15/2021- patient with worsening resp distress s/p ETT.  Has been started on lasix. Due to BMI >62 will hold proning. Patient with resp failure s/p ETT - I have updated husband Dana Bruce via phone today. 01/16/2021- patient was awake and had attempted to self extubate, sedation was increased. Midline was placed due to inadequate access no need for central line at this time. AM labs with worsening GFR, hyperglycemia.  Vitals stable. UOP adequate appx 1L overnight, CXR with bilateral multifocal infiltrates. Ventilator management performed.  01/17/2021- patient has been weaned on MV from FIO2-100% to 70%, repeat ABG this afternoon.  Called husband today (POA Dana Bruce) reviewed care plan and answered questions.  01/19/2021-appears volume overloaded, still with increased work of breathing when sedation is lightened  Past Medical History:  Hypertension Diabetes mellitus Arthritis Goiter IBS    Consults:  PCCM  Procedures:  N/A  Significant Diagnostic Tests:  2/3: Chest x-ray>>IMPRESSION: Diffuse bilateral ground-glass airspace opacities consistent with the patient's history of viral pneumonia. 2/8: Chest x-ray>> improved aeration from prior  Micro Data:  1/30: SARS-CoV-2 PCR>> positive 2/3: Blood culture x2>> 2/3: Sputum>> 2/3: Strep pneumo urinary antigen>> 2/3: Legionella urinary antigen>>  Antimicrobials:  Remdesivir 2/3>> 2/8 Levaquin 2/3>>   Objective   Blood pressure (!) 167/82, pulse 82, temperature (!) 101.48 F (38.6 C), temperature source Esophageal, resp. rate (!) 27, height 5' 7"  (1.702 m), weight (!) 179.6 kg, SpO2 96  %.    Vent Mode: PRVC FiO2 (%):  [40 %-60 %] 50 % Set Rate:  [30 bmp] 30 bmp Vt Set:  [500 mL] 500 mL PEEP:  [5 cmH20-8 cmH20] 5 cmH20   Intake/Output Summary (Last 24 hours) at 01/19/2021 1623 Last data filed at 01/19/2021 1600 Gross per 24 hour  Intake 2225.93 ml  Output 2290 ml  Net -64.07 ml   Filed Weights   01/15/2021 1441 02/05/2021 1508 01/15/21 0420  Weight: (!) 204.1 kg (!) 187.8 kg (!) 179.6 kg    Examination: Physical examination is limited due to need for PPE/CAPR General: Morbidly obese Age appropreiate.  On MV sedated, a synchronous with vent when sedation lightened HENT: Atraumatic, normocephalic, sclera edema, neck supple, trachea midline, no crepitus Lungs: Coarse breath sounds with occasional rhonchi Cardiovascular: Monitor sinus rhythm Abdomen: severely obese, protuberant, nondistended, soft  Extremities: No deformities, +2 anasarca Neuro: Sedated, opens eyes but does not track does not follow commands. GU: Foley in place. Skin: Warm and dry.  No obvious rashes, lesions, ulcerations  IMAGING      Assessment & Plan:   Acute Hypoxic Respiratory Failure in the setting of COVID-19 Pneumonia -Supplemental O2 as needed to maintain O2 saturations greater than 88% -On MV - PRVC 50% with weaning per RT -Full ventilator support -follow  ARDS net protocol high PEEP ladder -PEEP minimum 8 due to body habitus -Remdesivir, plan for 5 days>> completed -Steroids (Solu-Medrol 60 mg twice daily)>>>40bid -01/16/2021 -Follow inflammatory markers: Ferritin, D-dimer, CRP -Vitamin C, zinc -Respiratory cultures negative -Bronchodilators via MDI vent adapter -Maintain euvolemia to net negative fluid balance as able -Bumex x1 today for volume overload -Unable to prone due to body habitus (BMI 62) -Maintain airborne and  contact precautions      Diabetes mellitus with severe hyperglycemia -CBGs -Sliding scale insulin -Follow ICU hypo/hyperglycemia protocol  Morbid obesity  BMI 62 This issue adds complexity to her management Unable to prone due to body habitus Will make weaning challenging PEEP minimum of 8 due to body habitus to avoid derecruitment         Best practice (evaluated daily)  Diet: On trickle feeds, tolerating, adjust as Propofol d/c'd Pain/Anxiety/Delirium protocol (if indicated): Precedex/fentanyl, propofol being weaned off VAP protocol (if indicated): Yes, active DVT prophylaxis: Lovenox subcu GI prophylaxis: Pepcid Glucose control: Sliding scale insulin Mobility: As tolerated Disposition: ICU  Goals of Care:   Code Status: Full code  Labs   CBC: Recent Labs  Lab 01/15/21 0457 01/16/21 0220 01/17/21 0429 01/18/21 0458 01/19/21 0732  WBC 10.0 11.0* 11.4* 9.9 9.3  HGB 12.5 11.7* 10.6* 10.5* 10.9*  HCT 40.0 39.4 35.3* 34.3* 34.7*  MCV 85.7 89.1 89.1 87.3 85.9  PLT 285 357 371 290 106    Basic Metabolic Panel: Recent Labs  Lab 01/15/21 0457 01/16/21 0220 01/17/21 0429 01/18/21 0458 01/19/21 0732  NA 138 140 140 139 140  K 4.3 4.9 5.2* 4.5 4.6  CL 99 100 100 103 102  CO2 24 28 29 27 26   GLUCOSE 330* 326* 229* 283* 228*  BUN 18 30* 42* 60* 69*  CREATININE 1.01* 1.49* 1.85* 2.05* 1.66*  CALCIUM 9.3 9.0 8.6* 8.0* 8.2*  MG  --   --  1.8 1.7  --   PHOS  --   --  4.5 3.4  --    GFR: Estimated Creatinine Clearance: 65.8 mL/min (A) (by C-G formula based on SCr of 1.66 mg/dL (H)). Recent Labs  Lab 01/23/2021 1441 01/13/2021 1457 01/15/21 0457 01/16/21 0220 01/17/21 0429 01/18/21 0458 01/19/21 0732  PROCALCITON  --  0.21 0.15 0.16  --   --   --   WBC 12.9*  --  10.0 11.0* 11.4* 9.9 9.3  LATICACIDVEN 1.6  --   --   --   --   --   --     Liver Function Tests: Recent Labs  Lab 01/15/21 0457 01/16/21 0220 01/17/21 0429 01/18/21 0458 01/19/21 0732  AST 33 33 24 18 14*  ALT 22 22 17 16 14   ALKPHOS 60 63 52 43 44  BILITOT 1.1 0.8 0.8 1.0 0.9  PROT 6.9 6.7 6.1* 5.7* 5.9*  ALBUMIN 2.4* 2.4* 2.1* 2.2* 2.1*   No  results for input(s): LIPASE, AMYLASE in the last 168 hours. No results for input(s): AMMONIA in the last 168 hours.  ABG    Component Value Date/Time   PHART 7.33 (L) 01/18/2021 0428   PCO2ART 53 (H) 01/18/2021 0428   PO2ART 89 01/18/2021 0428   HCO3 27.9 01/18/2021 0428   ACIDBASEDEF 0.8 01/15/2021 1133   O2SAT 96.2 01/18/2021 0428     Coagulation Profile: No results for input(s): INR, PROTIME in the last 168 hours.  Cardiac Enzymes: No results for input(s): CKTOTAL, CKMB, CKMBINDEX, TROPONINI in the last 168 hours.  HbA1C: Hgb A1c MFr Bld  Date/Time Value Ref Range Status  01/25/2021 02:41 PM 6.3 (H) 4.8 - 5.6 % Final    Comment:    (NOTE) Pre diabetes:          5.7%-6.4%  Diabetes:              >6.4%  Glycemic control for   <7.0% adults with diabetes     CBG:  Recent Labs  Lab 01/18/21 2319 01/19/21 0313 01/19/21 0753 01/19/21 1125 01/19/21 1504  GLUCAP 193* 239* 221* 179* 245*    Review of Systems:    Unable to complete while on MV sedated to RASS-3   Allergies Allergies  Allergen Reactions  . Lasix [Furosemide] Itching  . Penicillins Hives and Itching    Has patient had a PCN reaction causing immediate rash, facial/tongue/throat swelling, SOB or lightheadedness with hypotension: yes Has patient had a PCN reaction causing severe rash involving mucus membranes or skin necrosis: no Has patient had a PCN reaction that required hospitalization No Has patient had a PCN reaction occurring within the last 10 years: No If all of the above answers are "NO", then may proceed with Cephalosporin use.   [place] Flavor [Flavoring Agent] Itching, Nausea And Vomiting and Rash    Breaks out in places where she touches strawberries      Current medications  Scheduled Meds: . vitamin C  1,000 mg Per Tube TID  . bumetanide (BUMEX) IV  1 mg Intravenous Once  . chlorhexidine gluconate (MEDLINE KIT)  15 mL Mouth Rinse BID  . Chlorhexidine Gluconate Cloth  6  each Topical Daily  . enoxaparin (LOVENOX) injection  0.5 mg/kg Subcutaneous Q24H  . feeding supplement (PROSource TF)  90 mL Per Tube 5 X Daily  . feeding supplement (VITAL AF 1.2 CAL)  1,000 mL Per Tube Q24H  . free water  200 mL Per Tube Q4H  . insulin aspart  0-20 Units Subcutaneous Q4H  . insulin detemir  14 Units Subcutaneous Daily  . mouth rinse  15 mL Mouth Rinse 10 times per day  . methylPREDNISolone (SOLU-MEDROL) injection  40 mg Intravenous Q12H  . multivitamin  15 mL Per Tube Daily  . polyethylene glycol  17 g Per Tube Daily  . senna-docusate  2 tablet Per Tube BID  . sodium chloride flush  10-40 mL Intracatheter Q12H  . sodium chloride flush  3 mL Intravenous Q12H   Continuous Infusions: . sodium chloride    . dexmedetomidine (PRECEDEX) IV infusion 1.2 mcg/kg/hr (01/19/21 1600)  . famotidine (PEPCID) IV Stopped (01/19/21 1012)  . fentaNYL infusion INTRAVENOUS 275 mcg/hr (01/19/21 1403)  . levofloxacin (LEVAQUIN) IV Stopped (01/18/21 1833)   PRN Meds:.sodium chloride, acetaminophen (TYLENOL) oral liquid 160 mg/5 mL, albuterol, guaiFENesin-dextromethorphan, midazolam, midazolam, ondansetron (ZOFRAN) IV, sodium chloride flush, sodium chloride flush    Critical care time: 40 minutes    Multidisciplinary rounds were performed with the ICU team.  Prognosis is guarded.    Renold Don, MD [facility]   *This note was dictated using voice recognition software/Dragon.  Despite best efforts to proofread, errors can occur which can change the meaning.  Any change was purely unintentional.

## 2021-01-19 NOTE — Consult Note (Signed)
PHARMACY CONSULT NOTE - FOLLOW UP  Pharmacy Consult for Electrolyte Monitoring and Replacement   Recent Labs: Potassium (mmol/L)  Date Value  01/19/2021 4.6   Magnesium (mg/dL)  Date Value  88/87/5797 1.7   Calcium (mg/dL)  Date Value  28/20/6015 8.2 (L)   Albumin (g/dL)  Date Value  61/53/7943 2.1 (L)   Phosphorus (mg/dL)  Date Value  27/61/4709 3.4   Sodium (mmol/L)  Date Value  01/19/2021 140   Assessment: 55 yo F with COVID-19. PMH includes HTN, diabetes, and IBS. Pt intubated 2/4. Pharmacy consulted for electrolyte monitoring and replacement.   Goal of Therapy:  Electrolytes WNL  Plan:  No replacement indicated. Follow up with morning labs.  Pricilla Riffle, PharmD Clinical Pharmacist  01/19/2021 11:36 AM

## 2021-01-19 NOTE — Plan of Care (Signed)
Pt neuro remains unchanged, SR to ST during shift, see eMAR for continuous IV drips, patient tolerating suction and requiring oral suctioning frequently, pt given bath. No significant events overnight. Handoff given to oncoming RN   Problem: Education: Goal: Knowledge of risk factors and measures for prevention of condition will improve Outcome: Progressing   Problem: Coping: Goal: Psychosocial and spiritual needs will be supported Outcome: Progressing   Problem: Respiratory: Goal: Will maintain a patent airway Outcome: Progressing Goal: Complications related to the disease process, condition or treatment will be avoided or minimized Outcome: Progressing

## 2021-01-20 DIAGNOSIS — J9601 Acute respiratory failure with hypoxia: Secondary | ICD-10-CM

## 2021-01-20 DIAGNOSIS — U071 COVID-19: Secondary | ICD-10-CM | POA: Diagnosis not present

## 2021-01-20 DIAGNOSIS — J80 Acute respiratory distress syndrome: Secondary | ICD-10-CM | POA: Diagnosis not present

## 2021-01-20 LAB — BLOOD CULTURE ID PANEL (REFLEXED) - BCID2

## 2021-01-20 LAB — GLUCOSE, CAPILLARY
Glucose-Capillary: 278 mg/dL — ABNORMAL HIGH (ref 70–99)
Glucose-Capillary: 202 mg/dL — ABNORMAL HIGH (ref 70–99)
Glucose-Capillary: 158 mg/dL — ABNORMAL HIGH (ref 70–99)
Glucose-Capillary: 224 mg/dL — ABNORMAL HIGH (ref 70–99)
Glucose-Capillary: 208 mg/dL — ABNORMAL HIGH (ref 70–99)

## 2021-01-20 LAB — FIBRIN DERIVATIVES D-DIMER (ARMC ONLY): Fibrin derivatives D-dimer (ARMC): 1032.87 ng/mL (FEU) — ABNORMAL HIGH (ref 0.00–499.00)

## 2021-01-20 LAB — C-REACTIVE PROTEIN: CRP: 6.9 mg/dL — ABNORMAL HIGH (ref <1.0)

## 2021-01-20 LAB — FERRITIN: Ferritin: 145 ng/mL (ref 11–307)

## 2021-01-20 LAB — MRSA PCR SCREENING: MRSA by PCR: NEGATIVE

## 2021-01-20 MED ORDER — PROSOURCE TF PO LIQD
90.0000 mL | Freq: Three times a day (TID) | ORAL | Status: DC
  Administered 2021-01-20 – 2021-01-22 (×6): 90 mL
  Filled 2021-01-20: qty 90

## 2021-01-20 MED ORDER — VANCOMYCIN HCL 1250 MG/250ML IV SOLN
1250.0000 mg | Freq: Two times a day (BID) | INTRAVENOUS | Status: DC
  Administered 2021-01-20 – 2021-01-21 (×2): 1250 mg via INTRAVENOUS
  Filled 2021-01-20 (×4): qty 250

## 2021-01-20 MED ORDER — SODIUM CHLORIDE 0.9 % IV SOLN
2.0000 g | Freq: Three times a day (TID) | INTRAVENOUS | Status: DC
  Administered 2021-01-20 – 2021-01-26 (×18): 2 g via INTRAVENOUS
  Filled 2021-01-20 (×14): qty 2
  Filled 2021-01-20: qty 1.2
  Filled 2021-01-20 (×3): qty 2
  Filled 2021-01-20 (×2): qty 1.2

## 2021-01-20 MED ORDER — VITAL 1.5 CAL PO LIQD
1000.0000 mL | ORAL | Status: DC
  Administered 2021-01-20 – 2021-01-22 (×3): 1000 mL

## 2021-01-20 NOTE — Consult Note (Signed)
Pharmacy Antibiotic Note  Dana Bruce is a 55 y.o. female admitted on 01/29/2021 with pneumonia.  Pharmacy has been consulted for Vancomycin dosing.  Plan: Vancomycin 2g x1 (2/09 0300); then Vancomycin 1250mg  q12h (1700/0500) AUC: 471.9; Cmax: 27.4; Cmin: 14.8 on Scr: 1.66 Vd: 0.5; CrCl per ABW.  Continues on Cefepime 2g IV q8h S/p 5days of levofloxacin 750mg  q24h  Height: 5\' 7"  (170.2 cm) Weight: (!) 179.6 kg (395 lb 15.1 oz) IBW/kg (Calculated) : 61.6  Temp (24hrs), Avg:102.5 F (39.2 C), Min:101.1 F (38.4 C), Max:103.82 F (39.9 C)  Recent Labs  Lab 01/25/2021 1441 01/12/2021 1457 01/15/21 0457 01/16/21 0220 01/17/21 0429 01/18/21 0458 01/19/21 0732  WBC 12.9*  --  10.0 11.0* 11.4* 9.9 9.3  CREATININE  --    < > 1.01* 1.49* 1.85* 2.05* 1.66*  LATICACIDVEN 1.6  --   --   --   --   --   --    < > = values in this interval not displayed.    Estimated Creatinine Clearance: 65.8 mL/min (A) (by C-G formula based on SCr of 1.66 mg/dL (H)).    Allergies  Allergen Reactions  . Lasix [Furosemide] Itching  . Penicillins Hives and Itching    Has patient had a PCN reaction causing immediate rash, facial/tongue/throat swelling, SOB or lightheadedness with hypotension: yes Has patient had a PCN reaction causing severe rash involving mucus membranes or skin necrosis: no Has patient had a PCN reaction that required hospitalization No Has patient had a PCN reaction occurring within the last 10 years: No If all of the above answers are "NO", then may proceed with Cephalosporin use.   03/17/21 Flavor [Flavoring Agent] Itching, Nausea And Vomiting and Rash    Breaks out in places where she touches strawberries     Antimicrobials this admission: Vancomycin (2/9 >> ongoing Cefepime (2/8 >> Levofloxacin (2/4 >> 2/8)  Microbiology results: 2/3 BCx: NG5D 2/4 Sputum: rare GPC, rare GNR, rare GPR  2/8 BCx: GPC - MRSE in 1 of 4  Thank you for allowing pharmacy to be a part of  this patient's care.  06-24-1988 01/20/2021 3:59 PM

## 2021-01-20 NOTE — Plan of Care (Signed)
Pt remains febrile treat accodrding to order, cooling blanket applied,  family updated when called. See charting for details of shift. No significant events overnight ,  Problem: Education: Goal: Knowledge of risk factors and measures for prevention of condition will improve Outcome: Progressing   Problem: Coping: Goal: Psychosocial and spiritual needs will be supported Outcome: Progressing   Problem: Respiratory: Goal: Will maintain a patent airway Outcome: Progressing Goal: Complications related to the disease process, condition or treatment will be avoided or minimized Outcome: Progressing

## 2021-01-20 NOTE — Consult Note (Signed)
PHARMACY CONSULT NOTE - FOLLOW UP  Pharmacy Consult for Electrolyte Monitoring and Replacement   Recent Labs: Potassium (mmol/L)  Date Value  01/19/2021 4.6   Magnesium (mg/dL)  Date Value  12/04/8249 1.7   Calcium (mg/dL)  Date Value  03/70/4888 8.2 (L)   Albumin (g/dL)  Date Value  91/69/4503 2.1 (L)   Phosphorus (mg/dL)  Date Value  88/82/8003 3.4   Sodium (mmol/L)  Date Value  01/19/2021 140   Assessment: 55 yo F with COVID-19. PMH includes HTN, diabetes, and IBS. Pt intubated 2/4. Pharmacy consulted for electrolyte monitoring and replacement.   Goal of Therapy:  Electrolytes WNL  Plan:  No replacement indicated. Continue to follow along.  Pricilla Riffle, PharmD Clinical Pharmacist  01/20/2021 1:45 PM

## 2021-01-20 NOTE — Progress Notes (Signed)
GOALS OF CARE DISCUSSION  The Clinical status was relayed to family in detail. Husband Vonna Kotyk over the phone   Updated and notified of patients medical condition.  Patient remains unresponsive and will not open eyes to command.     Patient with increased WOB and using accessory muscles to breathe Explained to family course of therapy and the modalities     Patient with Progressive multiorgan failure with a very high probablity of a very minimal chance of meaningful recovery despite all aggressive and optimal medical therapy.  Family understands the situation.  Patient Remains FULL CODE  Family are satisfied with Plan of action and management. All questions answered  Additional CC time 32 mins   Kurian Santiago Glad, M.D.  [facility]  Medical Director [facility] Medical Director [facility] Cardio-Pulmonary Department    \

## 2021-01-20 NOTE — Progress Notes (Signed)
Nutrition Follow-up  DOCUMENTATION CODES:   Morbid obesity  INTERVENTION:  Initiate Vital 1.5 Cal at 60 mL/hr (1440 mL goal daily volume) + PROSource TF 90 mL TID per tube. Provides 2400 kcal, 163 grams of protein, 1094 mL H2O daily.  Will discontinue liquid MVI as goal TF regimen meets 100% RDIs for vitamins/minerals.  NUTRITION DIAGNOSIS:   Inadequate oral intake related to inability to eat (pt sedated and ventilated) as evidenced by NPO status.  Ongoing.  GOAL:   Provide needs based on ASPEN/SCCM guidelines  Met with TF regimen.  MONITOR:   Vent status,Labs,Weight trends,Skin,I & O's  REASON FOR ASSESSMENT:   Ventilator    ASSESSMENT:   55 y/o female with h/o DM, HTN, IBS, DM, goiter and OSA who is admitted with COVID 19.   2/4 intubated  Patient is currently intubated on ventilator support MV: 15 L/min Temp (24hrs), Avg:102.2 F (39 C), Min:100.4 F (38 C), Max:103.82 F (39.9 C)  Medications reviewed and include: vitamin C 1000 mg TID per tube, free water 200 mL Q4hrs, Novolog 0-20 units Q4hrs, Levemir 14 units daily, Solu-Medrol 40 mg Q12hrs IV, Miralax, senna-docusate, cefepime, Precedex gtt, famotidine, fentanyl gtt.  Labs reviewed: CBG 158-278, BUN 69, Creatinine 1.66, Triglycerides 709.  I/O: 2790 mL UOP yesterday (0.6 mL/kg/hr)  Weight trend: 179.6 kg on 2/4; -8.2 kg from 2/3 so suspect one weight was not accurate  Enteral Access: OGT placed 2/4; terminates in body of stomach per abdominal x-ray 2/4  Discussed with RN and on rounds. Patient now off propofol gtt. Diet Order:   Diet Order            Diet NPO time specified  Diet effective now                EDUCATION NEEDS:   No education needs have been identified at this time  Skin:  Skin Assessment: Reviewed RN Assessment  Last BM:  Unknown/PTA  Height:   Ht Readings from Last 1 Encounters:  02/03/2021 5' 7"  (1.702 m)   Weight:   Wt Readings from Last 1 Encounters:  01/15/21  (!) 179.6 kg   Ideal Body Weight:  61.36 kg  BMI:  Body mass index is 62.01 kg/m.  Estimated Nutritional Needs:   Kcal:  2400-2600  Protein:  150-160 grams  Fluid:  >/= 2 L/day  Jacklynn Barnacle, MS, RD, LDN Pager number available on Amion

## 2021-01-20 NOTE — Progress Notes (Signed)
CRITICAL CARE NOTE 55 year old unvaccinated female admitted with acute hypoxic respiratory failure in the setting of COVID-19 pneumonia   Subjective Findings:  01/15/2021- patient with worsening resp distress s/p ETT.  Has been started on lasix. Due to BMI >62 will hold proning. Patient with resp failure s/p ETT - I have updated husband [name] via phone today. 01/16/2021- patient was awake and had attempted to self extubate, sedation was increased. Midline was placed due to inadequate access no need for central line at this time. AM labs with worsening GFR, hyperglycemia.  Vitals stable. UOP adequate appx 1L overnight, CXR with bilateral multifocal infiltrates. Ventilator management performed.  01/17/2021- patient has been weaned on MV from FIO2-100% to 70%, repeat ABG this afternoon.  Called husband today ([facility]) reviewed care plan and answered questions.  01/19/2021-appears volume overloaded, still with increased work of breathing when sedation is lightened  Significant Diagnostic Tests:  2/3: Chest x-ray>>IMPRESSION: Diffuse bilateral ground-glass airspace opacities consistent with the patient's history of viral pneumonia. 2/8: Chest x-ray>> improved aeration from prior  Micro Data:  1/30: SARS-CoV-2 PCR>> positive 2/3: Blood culture x2>> 2/3: Sputum>> 2/3: Strep pneumo urinary antigen>> 2/3: Legionella urinary antigen>>  Antimicrobials:  Remdesivir 2/3>> 2/8 Levaquin 2/3>>   CC  follow up respiratory failure  HPI Patient remains critically ill Prognosis is guarded  Vent Mode: PRVC FiO2 (%):  [50 %] 50 % Set Rate:  [30 bmp] 30 bmp Vt Set:  [500 mL] 500 mL PEEP:  [5 cmH20-8 cmH20] 8 cmH20 CBC    Component Value Date/Time   WBC 9.3 01/19/2021 0732   RBC 4.04 01/19/2021 0732   HGB 10.9 (L) 01/19/2021 0732   HCT 34.7 (L) 01/19/2021 0732   PLT 303 01/19/2021 0732   MCV 85.9 01/19/2021 0732   MCH 27.0 01/19/2021 0732   MCHC 31.4 01/19/2021 0732   RDW 14.9  01/19/2021 0732   LYMPHSABS 1.6 11/18/2016 2006   MONOABS 0.6 11/18/2016 2006   EOSABS 0.2 11/18/2016 2006   BASOSABS 0.1 11/18/2016 2006   BMP Latest Ref Rng & Units 01/19/2021 01/18/2021 01/17/2021  Glucose 70 - 99 mg/dL 228(H) 283(H) 229(H)  BUN 6 - 20 mg/dL 69(H) 60(H) 42(H)  Creatinine 0.44 - 1.00 mg/dL 1.66(H) 2.05(H) 1.85(H)  Sodium 135 - 145 mmol/L 140 139 140  Potassium 3.5 - 5.1 mmol/L 4.6 4.5 5.2(H)  Chloride 98 - 111 mmol/L 102 103 100  CO2 22 - 32 mmol/L _0 Calcium 8.9 - 10.3 mg/dL 8.2(L) 8.0(L) 8.6(L)    BP 123/60   Pulse 85   Temp (!) 101.7 F (38.7 C) (Esophageal)   Resp (!) 31   Ht _1  (1.702 m)   Wt (!) 179.6 kg   SpO2 95%   BMI 62.01 kg/m    I/O last 3 completed shifts: In: 3451.5 [I.V.:2599.6; NG/GT:100; IV Piggyback:751.9] Out: 4055 [Urine:3935; Emesis/NG output:120] Total I/O In: 168.7 [I.V.:168.7] Out: 250 [Urine:250]  SpO2: 95 % O2 Flow Rate (L/min): 45 L/min FiO2 (%): 50 %  Estimated body mass index is 62.01 kg/m as calculated from the following:   Height as of this encounter: _2  (1.702 m).   Weight as of this encounter: 179.6 kg.  SIGNIFICANT EVENTS   REVIEW OF SYSTEMS  PATIENT IS UNABLE TO PROVIDE COMPLETE REVIEW OF SYSTEMS DUE TO SEVERE CRITICAL ILLNESS       PHYSICAL EXAMINATION: GENERAL:critically ill appearing, +resp distress NECK: Supple.  PULMONARY: +rhonchi, +wheezing CARDIOVASCULAR: S1 and S2.  GASTROINTESTINAL: Soft, nontender, +  Positive bowel sounds.  MUSCULOSKELETAL: No swelling, clubbing, or edema.  NEUROLOGIC: obtunded, GCS<8 SKIN:intact,warm,dry     MEDICATIONS: I have reviewed all medications and confirmed regimen as documented   CULTURE RESULTS   Recent Results (from the past 240 hour(s))  Blood Culture (routine x 2)     Status: None   Collection Time: 01/24/2021  2:56 PM   Specimen: BLOOD  Result Value Ref Range Status   Specimen Description BLOOD BLOOD LEFT ARM  Final   Special Requests    Final    BOTTLES DRAWN AEROBIC AND ANAEROBIC Blood Culture results may not be optimal due to an inadequate volume of blood received in culture bottles   Culture   Final    NO GROWTH 5 DAYS Performed at [facility], [address], [place], [place] [address]    Report Status 01/19/2021 FINAL  Final  Blood Culture (routine x 2)     Status: None   Collection Time: 01/13/2021  2:59 PM   Specimen: BLOOD  Result Value Ref Range Status   Specimen Description BLOOD BLOOD LEFT FOREARM  Final   Special Requests   Final    BOTTLES DRAWN AEROBIC AND ANAEROBIC Blood Culture adequate volume   Culture   Final    NO GROWTH 5 DAYS Performed at [facility], [address] [place], [place] [address]    Report Status 01/19/2021 FINAL  Final  Culture, respiratory     Status: None   Collection Time: 01/15/21  5:50 PM   Specimen: Tracheal Aspirate; Respiratory  Result Value Ref Range Status   Specimen Description   Final    TRACHEAL ASPIRATE Performed at [facility], [address], [place], [place] [address]    Special Requests   Final    NONE Performed at [facility], [address] [place], [place] [address]    Gram Stain   Final    FEW WBC PRESENT, PREDOMINANTLY PMN FEW GRAM NEGATIVE RODS FEW GRAM POSITIVE RODS RARE GRAM POSITIVE COCCI    Culture   Final    Normal respiratory flora-no Staph aureus or Pseudomonas seen Performed at [facility], [place] [address] [place], [place] [address]    Report Status 01/18/2021 FINAL  Final  Culture, respiratory (non-expectorated)     Status: None (Preliminary result)   Collection Time: 01/19/21  6:20 PM   Specimen: Tracheal Aspirate; Respiratory  Result Value Ref Range Status   Specimen Description   Final    TRACHEAL ASPIRATE Performed at [facility], [address] [place], [place] [address]    Special Requests   Final    NONE Performed at [facility], [address]  [place], [place] [address]    Gram Stain   Final    ABUNDANT WBC PRESENT, PREDOMINANTLY PMN FEW GRAM POSITIVE COCCI RARE GRAM NEGATIVE RODS RARE GRAM POSITIVE RODS Performed at [facility], [place] [address] [place], [place] [address]    Culture PENDING  Incomplete   Report Status PENDING  Incomplete  CULTURE, BLOOD (ROUTINE X 2) w Reflex to ID Panel     Status: None (Preliminary result)   Collection Time: 01/19/21  6:45 PM   Specimen: BLOOD  Result Value Ref Range Status   Specimen Description BLOOD BLOOD LEFT HAND  Final   Special Requests   Final    BOTTLES DRAWN AEROBIC AND ANAEROBIC Blood Culture adequate volume   Culture   Final  NO GROWTH < 12 HOURS Performed at [facility], [address] [place], [place] [address]    Report Status PENDING  Incomplete  CULTURE, BLOOD (ROUTINE X 2) w Reflex to ID Panel     Status: None (Preliminary result)   Collection Time: 01/19/21  6:47 PM   Specimen: BLOOD  Result Value Ref Range Status   Specimen Description BLOOD BLOOD RIGHT HAND  Final   Special Requests   Final    BOTTLES DRAWN AEROBIC AND ANAEROBIC Blood Culture adequate volume   Culture   Final    NO GROWTH < 12 HOURS Performed at [facility], [address], [place], [place] [address]    Report Status PENDING  Incomplete          IMAGING    No results found.   Nutrition Status: Nutrition Problem: Inadequate oral intake Etiology: inability to eat (pt sedated and ventilated) Signs/Symptoms: NPO status       Indwelling Urinary Catheter continued, requirement due to   Reason to continue Indwelling Urinary Catheter strict Intake/Output monitoring for hemodynamic instability   Central Line/ continued, requirement due to  Reason to continue [place] of central venous pressure or other hemodynamic parameters and poor IV access   Ventilator continued, requirement due to severe respiratory failure   Ventilator Sedation RASS 0  to -2      ASSESSMENT AND PLAN SYNOPSIS 55 year old unvaccinated female admitted with acute hypoxic respiratory failure in the setting of COVID-19 pneumonia  Acute hypoxemic respiratory failure due to COVID-19 pneumonia/ARDS Mechanical ventilation via ARDS protocol, target PRVC 6 cc/kg Wean PEEP and FiO2 as able Goal plateau pressure less than 30, driving pressure less than 15 Deep sedation per PAD protocol Diuresis as tolerated based on Kidney function VAP prevention order set S/p Remdesivir Therapy IV STEROIDS Therapy Follow inflammatory markers as needed with CRP Vitamin C, zinc Plan to repeat and check resp cultures as needed   Severe ACUTE Hypoxic and Hypercapnic Respiratory Failure -continue Full MV support -continue Bronchodilator Therapy -Wean Fio2 and PEEP as tolerated -will perform SAT/SBT when respiratory parameters are met -VAP/VENT bundle implementation  ACUTE DIASTOLIC CARDIAC FAILURE-  -oxygen as needed -Lasix as tolerated   Morbid obesity, possible OSA.   Will certainly impact respiratory mechanics, ventilator weaning Suspect will need to consider additional PEEP   ACUTE KIDNEY INJURY/Renal Failure -continue Foley Catheter-assess need -Avoid nephrotoxic agents -Follow urine output, BMP -Ensure adequate renal perfusion, optimize oxygenation -Renal dose medications     NEUROLOGY Acute toxic metabolic encephalopathy WUA pending   CARDIAC ICU monitoring  ID -continue IV abx as prescibed -follow up cultures  GI GI PROPHYLAXIS as indicated   DIET-->TF's as tolerated Constipation protocol as indicated  ENDO - will use ICU hypoglycemic\Hyperglycemia protocol if indicated    ELECTROLYTES -follow labs as needed -replace as needed -pharmacy consultation and following   DVT/GI PRX ordered and assessed TRANSFUSIONS AS NEEDED MONITOR FSBS I Assessed the need for Labs I Assessed the need for Foley I Assessed the need for Central  Venous Line Family Discussion when available I Assessed the need for Mobilization I made an Assessment of medications to be adjusted accordingly Safety Risk assessment completed   CASE DISCUSSED IN MULTIDISCIPLINARY ROUNDS WITH ICU TEAM  Critical Care Time devoted to patient care services described in this note is 47  minutes.   Overall, patient is critically ill, prognosis is guarded.  Patient with Multiorgan failure and at high risk for cardiac arrest and  death.    Corrin Parker, M.D.  [facility]  Medical Director [place] Director [facility] Cardio-Pulmonary Department

## 2021-01-21 DIAGNOSIS — J9601 Acute respiratory failure with hypoxia: Secondary | ICD-10-CM | POA: Diagnosis not present

## 2021-01-21 DIAGNOSIS — U071 COVID-19: Secondary | ICD-10-CM | POA: Diagnosis not present

## 2021-01-21 LAB — GLUCOSE, CAPILLARY
Glucose-Capillary: 239 mg/dL — ABNORMAL HIGH (ref 70–99)
Glucose-Capillary: 244 mg/dL — ABNORMAL HIGH (ref 70–99)
Glucose-Capillary: 256 mg/dL — ABNORMAL HIGH (ref 70–99)
Glucose-Capillary: 263 mg/dL — ABNORMAL HIGH (ref 70–99)
Glucose-Capillary: 278 mg/dL — ABNORMAL HIGH (ref 70–99)
Glucose-Capillary: 290 mg/dL — ABNORMAL HIGH (ref 70–99)
Glucose-Capillary: 318 mg/dL — ABNORMAL HIGH (ref 70–99)

## 2021-01-21 LAB — RENAL FUNCTION PANEL
Albumin: 2.1 g/dL — ABNORMAL LOW (ref 3.5–5.0)
Anion gap: 9 (ref 5–15)
BUN: 76 mg/dL — ABNORMAL HIGH (ref 6–20)
CO2: 24 mmol/L (ref 22–32)
Calcium: 8.3 mg/dL — ABNORMAL LOW (ref 8.9–10.3)
Chloride: 106 mmol/L (ref 98–111)
Creatinine, Ser: 1.55 mg/dL — ABNORMAL HIGH (ref 0.44–1.00)
GFR, Estimated: 39 mL/min — ABNORMAL LOW (ref >60)
Glucose, Bld: 352 mg/dL — ABNORMAL HIGH (ref 70–99)
Phosphorus: 4.8 mg/dL — ABNORMAL HIGH (ref 2.5–4.6)
Potassium: 5.7 mmol/L — ABNORMAL HIGH (ref 3.5–5.1)
Sodium: 139 mmol/L (ref 135–145)

## 2021-01-21 LAB — FIBRIN DERIVATIVES D-DIMER (ARMC ONLY): Fibrin derivatives D-dimer (ARMC): 1185.56 ng/mL (FEU) — ABNORMAL HIGH (ref 0.00–499.00)

## 2021-01-21 LAB — CBC WITH DIFFERENTIAL/PLATELET
Abs Immature Granulocytes: 0.91 10*3/uL — ABNORMAL HIGH (ref 0.00–0.07)
Basophils Absolute: 0.1 10*3/uL (ref 0.0–0.1)
Basophils Relative: 1 %
Eosinophils Absolute: 0 10*3/uL (ref 0.0–0.5)
Eosinophils Relative: 0 %
HCT: 36.2 % (ref 36.0–46.0)
Hemoglobin: 10.9 g/dL — ABNORMAL LOW (ref 12.0–15.0)
Immature Granulocytes: 9 %
Lymphocytes Relative: 12 %
Lymphs Abs: 1.1 10*3/uL (ref 0.7–4.0)
MCH: 26.1 pg (ref 26.0–34.0)
MCHC: 30.1 g/dL (ref 30.0–36.0)
MCV: 86.8 fL (ref 80.0–100.0)
Monocytes Absolute: 0.7 10*3/uL (ref 0.1–1.0)
Monocytes Relative: 7 %
Neutro Abs: 7 10*3/uL (ref 1.7–7.7)
Neutrophils Relative %: 71 %
Platelets: 263 10*3/uL (ref 150–400)
RBC: 4.17 MIL/uL (ref 3.87–5.11)
RDW: 14.8 % (ref 11.5–15.5)
Smear Review: NORMAL
WBC: 9.9 10*3/uL (ref 4.0–10.5)
nRBC: 0 % (ref 0.0–0.2)

## 2021-01-21 LAB — BASIC METABOLIC PANEL
Anion gap: 10 (ref 5–15)
BUN: 72 mg/dL — ABNORMAL HIGH (ref 6–20)
CO2: 21 mmol/L — ABNORMAL LOW (ref 22–32)
Calcium: 8.6 mg/dL — ABNORMAL LOW (ref 8.9–10.3)
Chloride: 108 mmol/L (ref 98–111)
Creatinine, Ser: 1.21 mg/dL — ABNORMAL HIGH (ref 0.44–1.00)
GFR, Estimated: 53 mL/min — ABNORMAL LOW (ref >60)
Glucose, Bld: 307 mg/dL — ABNORMAL HIGH (ref 70–99)
Potassium: 6 mmol/L — ABNORMAL HIGH (ref 3.5–5.1)
Sodium: 139 mmol/L (ref 135–145)

## 2021-01-21 LAB — POTASSIUM
Potassium: 6.1 mmol/L — ABNORMAL HIGH (ref 3.5–5.1)
Potassium: 5.3 mmol/L — ABNORMAL HIGH (ref 3.5–5.1)

## 2021-01-21 LAB — C-REACTIVE PROTEIN: CRP: 5.2 mg/dL — ABNORMAL HIGH (ref <1.0)

## 2021-01-21 LAB — FERRITIN: Ferritin: 170 ng/mL (ref 11–307)

## 2021-01-21 LAB — MAGNESIUM: Magnesium: 2.4 mg/dL (ref 1.7–2.4)

## 2021-01-21 MED ORDER — DEXTROSE 50 % IV SOLN: 1.0000 | Freq: Once | INTRAVENOUS | Status: AC

## 2021-01-21 MED ORDER — INSULIN DETEMIR 100 UNIT/ML ~~LOC~~ SOLN
20.0000 [IU] | Freq: Every day | SUBCUTANEOUS | Status: DC
  Administered 2021-01-21 – 2021-01-23 (×3): 20 [IU] via SUBCUTANEOUS
  Filled 2021-01-21 (×4): qty 0.2

## 2021-01-21 MED ORDER — LACTULOSE 10 GM/15ML PO SOLN
10.0000 g | Freq: Two times a day (BID) | ORAL | Status: DC
  Administered 2021-01-21 – 2021-01-26 (×5): 10 g
  Filled 2021-01-21 (×7): qty 30

## 2021-01-21 MED ORDER — DEXTROSE 50 % IV SOLN
INTRAVENOUS | Status: AC
  Administered 2021-01-21: 50 mL via INTRAVENOUS
  Filled 2021-01-21: qty 50

## 2021-01-21 MED ORDER — INSULIN ASPART 100 UNIT/ML IV SOLN
10.0000 [IU] | INTRAVENOUS | Status: AC
  Administered 2021-01-21: 10 [IU] via INTRAVENOUS
  Filled 2021-01-21: qty 0.1

## 2021-01-21 MED ORDER — INSULIN ASPART 100 UNIT/ML IV SOLN
10.0000 [IU] | Freq: Once | INTRAVENOUS | Status: AC
  Administered 2021-01-21: 10 [IU] via INTRAVENOUS
  Filled 2021-01-21: qty 0.1

## 2021-01-21 MED ORDER — VANCOMYCIN HCL 1500 MG/300ML IV SOLN
1500.0000 mg | INTRAVENOUS | Status: DC
  Administered 2021-01-22: 1500 mg via INTRAVENOUS
  Filled 2021-01-21: qty 300

## 2021-01-21 MED ORDER — INSULIN ASPART 100 UNIT/ML ~~LOC~~ SOLN
5.0000 [IU] | SUBCUTANEOUS | Status: DC
  Administered 2021-01-21 – 2021-02-01 (×65): 5 [IU] via SUBCUTANEOUS
  Filled 2021-01-21 (×59): qty 1

## 2021-01-21 MED ORDER — FENTANYL 2500MCG IN NS 250ML (10MCG/ML) PREMIX INFUSION
0.0000 ug/h | INTRAVENOUS | Status: DC
  Administered 2021-01-21: 250 ug/h via INTRAVENOUS
  Administered 2021-01-22: 225 ug/h via INTRAVENOUS
  Administered 2021-01-22: 250 ug/h via INTRAVENOUS
  Administered 2021-01-23: 125 ug/h via INTRAVENOUS
  Administered 2021-01-23 – 2021-01-25 (×7): 300 ug/h via INTRAVENOUS
  Administered 2021-01-26: 275 ug/h via INTRAVENOUS
  Administered 2021-01-26: 300 ug/h via INTRAVENOUS
  Administered 2021-01-27: 200 ug/h via INTRAVENOUS
  Administered 2021-01-28: 100 ug/h via INTRAVENOUS
  Administered 2021-01-29: 150 ug/h via INTRAVENOUS
  Administered 2021-01-30: 175 ug/h via INTRAVENOUS
  Administered 2021-01-30: 200 ug/h via INTRAVENOUS
  Administered 2021-01-31: 300 ug/h via INTRAVENOUS
  Administered 2021-01-31: 200 ug/h via INTRAVENOUS
  Administered 2021-02-01: 250 ug/h via INTRAVENOUS
  Administered 2021-02-01: 300 ug/h via INTRAVENOUS
  Filled 2021-01-21 (×22): qty 250

## 2021-01-21 MED ORDER — METHYLPREDNISOLONE SODIUM SUCC 40 MG IJ SOLR
20.0000 mg | Freq: Two times a day (BID) | INTRAMUSCULAR | Status: DC
  Administered 2021-01-21 – 2021-01-25 (×8): 20 mg via INTRAVENOUS
  Filled 2021-01-21 (×8): qty 1

## 2021-01-21 MED ORDER — DEXTROSE 50 % IV SOLN
12.5000 g | INTRAVENOUS | Status: AC
  Administered 2021-01-21: 12.5 g via INTRAVENOUS
  Filled 2021-01-21: qty 50

## 2021-01-21 NOTE — Plan of Care (Signed)

## 2021-01-21 NOTE — Progress Notes (Addendum)
CRITICAL CARE NOTE 55 year old unvaccinated female admitted with acute hypoxic respiratory failure in the setting of COVID-19 pneumonia   Subjective Findings:  01/15/2021-patient with worsening resp distress s/p ETT. Has been started on lasix. Due to BMI >62 will hold proning. Patient with resp failure s/p ETT - I have updated husband [name] via phone today. 01/16/2021-patient was awake and had attempted to self extubate, sedation was increased. Midline was placed due to inadequate access no need for central line at this time. AM labs with worsening GFR, hyperglycemia. Vitals stable. UOP adequate appx 1L overnight, CXR with bilateral multifocal infiltrates. Ventilator management performed.  01/17/2021-patient has been weaned on MV from FIO2-100% to 70%, repeat ABG this afternoon. Called husband today (POA Grayson White) reviewed care plan and answered questions.  01/19/2021-appears volume overloaded, still with increased work of breathing when sedation is lightened 2/9 FAILURE TO WEAN FROM VENT, SEVERE ALI  Significant Diagnostic Tests:  2/3: Chest x-ray>>IMPRESSION: Diffuse bilateral ground-glass airspace opacities consistent with the patient's history of viral pneumonia. 2/8:Chest x-ray>>improved aeration from prior  Micro Data:  1/30: SARS-CoV-2 PCR>> positive 2/3: Blood culture x2>> 2/3: Sputum>> 2/3: Strep pneumo urinary antigen>>NEG 2/3: Legionella urinary antigen>>NEG 2/8 [facility] EPI started vancomycin Antimicrobials:  Remdesivir 2/3>>2/8 Levaquin 2/3>>2/9 Cefepime and Vanc 2/9>>      CC  follow up respiratory failure  SUBJECTIVE Patient remains critically ill Prognosis is guarded +hyperkalemia  Vent Mode: PRVC FiO2 (%):  [40 %] 40 % Set Rate:  [30 bmp] 30 bmp Vt Set:  [500 mL] 500 mL PEEP:  [8 cmH20] 8 cmH20 Plateau Pressure:  [22 cmH20] 22 cmH20   BP 137/73   Pulse 68   Temp 98.6 F (37 C) (Esophageal)   Resp (!) 31   Ht 5' 7"  (1.702 m)   Wt (!)  179.6 kg   SpO2 95%   BMI 62.01 kg/m    I/O last 3 completed shifts: In: 4812.8 [I.V.:1856.7; NG/GT:1637; IV Piggyback:1319.1] Out: 5784 [Urine:3610] No intake/output data recorded.  SpO2: 95 % O2 Flow Rate (L/min): 45 L/min FiO2 (%): 40 %  Estimated body mass index is 62.01 kg/m as calculated from the following:   Height as of this encounter: 5' 7"  (1.702 m).   Weight as of this encounter: 179.6 kg.  SIGNIFICANT EVENTS   REVIEW OF SYSTEMS  PATIENT IS UNABLE TO PROVIDE COMPLETE REVIEW OF SYSTEMS DUE TO SEVERE CRITICAL ILLNESS       PHYSICAL EXAMINATION: GENERAL:critically ill appearing, +resp distress NECK: Supple.  PULMONARY: +rhonchi, +wheezing CARDIOVASCULAR: S1 and S2.  GASTROINTESTINAL: Soft, nontender, +Positive bowel sounds.  MUSCULOSKELETAL: No swelling, clubbing, or edema.  NEUROLOGIC: obtunded, GCS<8 SKIN:intact,warm,dry     MEDICATIONS: I have reviewed all medications and confirmed regimen as documented   CULTURE RESULTS   Recent Results (from the past 240 hour(s))  Blood Culture (routine x 2)     Status: None   Collection Time: 02/03/2021  2:56 PM   Specimen: BLOOD  Result Value Ref Range Status   Specimen Description BLOOD BLOOD LEFT ARM  Final   Special Requests   Final    BOTTLES DRAWN AEROBIC AND ANAEROBIC Blood Culture results may not be optimal due to an inadequate volume of blood received in culture bottles   Culture   Final    NO GROWTH 5 DAYS Performed at [facility], [address] [place], [place] [address]    Report Status 01/19/2021 FINAL  Final  Blood Culture (routine x 2)     Status:  None   Collection Time: 01/24/2021  2:59 PM   Specimen: BLOOD  Result Value Ref Range Status   Specimen Description BLOOD BLOOD LEFT FOREARM  Final   Special Requests   Final    BOTTLES DRAWN AEROBIC AND ANAEROBIC Blood Culture adequate volume   Culture   Final    NO GROWTH 5 DAYS Performed at [facility], [address], [place], [place] [address]    Report Status 01/19/2021 FINAL  Final  Culture, respiratory     Status: None   Collection Time: 01/15/21  5:50 PM   Specimen: Tracheal Aspirate; Respiratory  Result Value Ref Range Status   Specimen Description   Final    TRACHEAL ASPIRATE Performed at [facility], [address] [place], [place] [address]    Special Requests   Final    NONE Performed at [facility], [address] [place], [place] [address]    Gram Stain   Final    FEW WBC PRESENT, PREDOMINANTLY PMN FEW GRAM NEGATIVE RODS FEW GRAM POSITIVE RODS RARE GRAM POSITIVE COCCI    Culture   Final    Normal respiratory flora-no Staph aureus or Pseudomonas seen Performed at [facility], [place] [address], [place], [place] [address]    Report Status 01/18/2021 FINAL  Final  Culture, respiratory (non-expectorated)     Status: None (Preliminary result)   Collection Time: 01/19/21  6:20 PM   Specimen: Tracheal Aspirate; Respiratory  Result Value Ref Range Status   Specimen Description   Final    TRACHEAL ASPIRATE Performed at [facility], [address] [place], [place] [address]    Special Requests   Final    NONE Performed at [facility], [address] [place], [place] [address]    Gram Stain   Final    ABUNDANT WBC PRESENT, PREDOMINANTLY PMN FEW GRAM POSITIVE COCCI RARE GRAM NEGATIVE RODS RARE GRAM POSITIVE RODS Performed at [facility], [place] [address] [place], [place] [address]    Culture PENDING  Incomplete   Report Status PENDING  Incomplete  CULTURE, BLOOD (ROUTINE X 2) w Reflex to ID Panel     Status: None (Preliminary result)   Collection Time: 01/19/21  6:45 PM   Specimen: BLOOD  Result Value Ref Range Status   Specimen Description BLOOD BLOOD LEFT HAND  Final   Special Requests   Final    BOTTLES DRAWN AEROBIC AND ANAEROBIC Blood Culture adequate volume   Culture  Setup Time   Final    Organism ID to  follow GRAM POSITIVE COCCI IN BOTH AEROBIC AND ANAEROBIC BOTTLES CRITICAL RESULT CALLED TO, READ BACK BY AND VERIFIED WITH: BRANDON BEERS AT 5361 ON 01/20/21 BY SS Performed at [facility], [address] [place], [place] [address]    Culture GRAM POSITIVE COCCI  Final   Report Status PENDING  Incomplete  Blood Culture ID Panel (Reflexed)     Status: Abnormal   Collection Time: 01/19/21  6:45 PM  Result Value Ref Range Status   Enterococcus faecalis NOT DETECTED NOT DETECTED Final   Enterococcus Faecium NOT DETECTED NOT DETECTED Final   Listeria monocytogenes NOT DETECTED NOT DETECTED Final   Staphylococcus species DETECTED (A) NOT DETECTED Final    Comment: CRITICAL RESULT CALLED TO, READ BACK BY AND VERIFIED WITH: BRANDON BEERS AT 1539 ON 01/20/21 BY SS    Staphylococcus aureus (BCID) NOT DETECTED NOT DETECTED Final   Staphylococcus epidermidis  DETECTED (A) NOT DETECTED Final    Comment: Methicillin (oxacillin) resistant coagulase negative staphylococcus. Possible blood culture contaminant (unless isolated from more than one blood culture draw or clinical case suggests pathogenicity). No antibiotic treatment is indicated for blood  culture contaminants. CRITICAL RESULT CALLED TO, READ BACK BY AND VERIFIED WITH: BRANDON BEERS AT 1610 ON 01/20/21 BY SS    Staphylococcus lugdunensis NOT DETECTED NOT DETECTED Final   Streptococcus species NOT DETECTED NOT DETECTED Final   Streptococcus agalactiae NOT DETECTED NOT DETECTED Final   Streptococcus pneumoniae NOT DETECTED NOT DETECTED Final   Streptococcus pyogenes NOT DETECTED NOT DETECTED Final   A.calcoaceticus-baumannii NOT DETECTED NOT DETECTED Final   Bacteroides fragilis NOT DETECTED NOT DETECTED Final   Enterobacterales NOT DETECTED NOT DETECTED Final   Enterobacter cloacae complex NOT DETECTED NOT DETECTED Final   Escherichia coli NOT DETECTED NOT DETECTED Final   Klebsiella aerogenes NOT DETECTED NOT DETECTED Final    Klebsiella oxytoca NOT DETECTED NOT DETECTED Final   Klebsiella pneumoniae NOT DETECTED NOT DETECTED Final   Proteus species NOT DETECTED NOT DETECTED Final   Salmonella species NOT DETECTED NOT DETECTED Final   Serratia marcescens NOT DETECTED NOT DETECTED Final   Haemophilus influenzae NOT DETECTED NOT DETECTED Final   Neisseria meningitidis NOT DETECTED NOT DETECTED Final   Pseudomonas aeruginosa NOT DETECTED NOT DETECTED Final   Stenotrophomonas maltophilia NOT DETECTED NOT DETECTED Final   Candida albicans NOT DETECTED NOT DETECTED Final   Candida auris NOT DETECTED NOT DETECTED Final   Candida glabrata NOT DETECTED NOT DETECTED Final   Candida krusei NOT DETECTED NOT DETECTED Final   Candida parapsilosis NOT DETECTED NOT DETECTED Final   Candida tropicalis NOT DETECTED NOT DETECTED Final   Cryptococcus neoformans/gattii NOT DETECTED NOT DETECTED Final   Methicillin resistance mecA/C DETECTED (A) NOT DETECTED Final    Comment: CRITICAL RESULT CALLED TO, READ BACK BY AND VERIFIED WITH: BRANDON BEERS AT 1539 ON 01/20/21 BY SS Performed at [facility], [address] [place], [place] [address]   CULTURE, BLOOD (ROUTINE X 2) w Reflex to ID Panel     Status: None (Preliminary result)   Collection Time: 01/19/21  6:47 PM   Specimen: BLOOD  Result Value Ref Range Status   Specimen Description BLOOD BLOOD RIGHT HAND  Final   Special Requests   Final    BOTTLES DRAWN AEROBIC AND ANAEROBIC Blood Culture adequate volume   Culture   Final    NO GROWTH 2 DAYS Performed at [facility], [address] [place], [place] [address]    Report Status PENDING  Incomplete  MRSA PCR Screening     Status: None   Collection Time: 01/20/21  7:58 AM   Specimen: Nasal Mucosa; Nasopharyngeal  Result Value Ref Range Status   MRSA by PCR NEGATIVE NEGATIVE Final    Comment:        The GeneXpert MRSA Assay (FDA approved for NASAL specimens only), is one component of a comprehensive  MRSA colonization surveillance program. It is not intended to diagnose MRSA infection nor to guide or monitor treatment for MRSA infections. Performed at [facility], [address] [place], [place] [address]             Indwelling Urinary Catheter continued, requirement due to   Reason to continue Indwelling Urinary Catheter strict Intake/Output monitoring for hemodynamic instability   Central Line/ continued, requirement due to  Reason to continue [place] of central venous pressure  or other hemodynamic parameters and poor IV access   Ventilator continued, requirement due to severe respiratory failure   Ventilator Sedation RASS 0 to -2      ASSESSMENT AND PLAN SYNOPSIS  55 year old unvaccinated female admitted with acute hypoxic respiratory failure in the setting of COVID-19 pneumonia    Acute hypoxemic respiratory failure due to COVID-19 pneumonia/ARDS Mechanical ventilation via ARDS protocol, target PRVC 6 cc/kg Wean PEEP and FiO2 as able Goal plateau pressure less than 30, driving pressure less than 15 Paralytics if necessary for vent synchrony, gas exchange Diuresis as tolerated based on Kidney function VAP prevention order set IV STEROIDS Therapy Follow inflammatory markers as needed with CRP Vitamin C, zinc Plan to repeat and check resp cultures as needed   Severe ACUTE Hypoxic and Hypercapnic Respiratory Failure -continue Full MV support -continue Bronchodilator Therapy -Wean Fio2 and PEEP as tolerated -will perform SAT/SBT when respiratory parameters are met -VAP/VENT bundle implementation  ACUTE DIASTOLIC CARDIAC FAILURE-  -oxygen as needed -Lasix as tolerated   Morbid obesity, possible OSA.   Will certainly impact respiratory mechanics, ventilator weaning Suspect will need to consider additional PEEP   ACUTE KIDNEY INJURY/Renal Failure -continue Foley Catheter-assess need -Avoid nephrotoxic agents -Follow urine  output, BMP -Ensure adequate renal perfusion, optimize oxygenation -Renal dose medications     NEUROLOGY Acute toxic metabolic encephalopathy [facility] PENDING   CARDIAC ICU monitoring  ID -continue IV abx as prescibed -follow up cultures  GI GI PROPHYLAXIS as indicated   DIET-->TF's as tolerated Constipation protocol as indicated  ENDO - will use ICU hypoglycemic\Hyperglycemia protocol if indicated    ELECTROLYTES -follow labs as needed -replace as needed -pharmacy consultation and following   DVT/GI PRX ordered and assessed TRANSFUSIONS AS NEEDED MONITOR FSBS I Assessed the need for Labs I Assessed the need for Foley I Assessed the need for Central Venous Line Family Discussion when available I Assessed the need for Mobilization I made an Assessment of medications to be adjusted accordingly Safety Risk assessment completed   CASE DISCUSSED IN MULTIDISCIPLINARY ROUNDS WITH ICU TEAM  Critical Care Time devoted to patient care services described in this note is 47 minutes.   Overall, patient is critically ill, prognosis is guarded.  Patient with Multiorgan failure and at high risk for cardiac arrest and death.    Corrin Parker, M.D.  [facility]  Medical Director [place] Director [facility] Cardio-Pulmonary Department

## 2021-01-21 NOTE — Progress Notes (Signed)
Inpatient Diabetes Program Recommendations  AACE/ADA: New Consensus Statement on Inpatient Glycemic Control   Target Ranges:  Prepandial:   less than 140 mg/dL      Peak postprandial:   less than 180 mg/dL (1-2 hours)      Critically ill patients:  140 - 180 mg/dL   Results for JALYSA, SWOPES (MRN [id number]) as of 01/21/2021 08:00  Ref. Range 01/20/2021 07:38 01/20/2021 11:28 01/20/2021 16:27 01/20/2021 19:23 01/21/2021 00:09 01/21/2021 03:58 01/21/2021 07:25  Glucose-Capillary Latest Ref Range: 70 - 99 mg/dL 537 (H) 943 (H) 276 (H) 208 (H) 239 (H) 318 (H) 244 (H)   Review of Glycemic Control  Diabetes history: DM2 Outpatient Diabetes medications: Metformin 1000 mg BID, Actos 30 mg daily, Trulicity 1.5 mg Qweek Current orders for Inpatient glycemic control: Levemir 14 units daily, Novolog 0-20 units Q4H; Solumedrol 40 mg Q12H, Vital @ 60 ml/hr   Inpatient Diabetes Program Recommendations:    Insulin: If steroids and tube feedings are continued as ordered, please consider increasing Levemir to 20 units daily and ordering Novolog 5 units Q4H for tube feeding coverage. If tube feeding is stopped or held then Novolog tube feeding coverage should also be stopped or held.  Thanks, Orlando Penner, RN, MSN, CDE Diabetes Coordinator Inpatient Diabetes Program [phone] (Team Pager from 8am to 5pm)

## 2021-01-21 NOTE — Consult Note (Signed)
PHARMACY CONSULT NOTE - FOLLOW UP  Pharmacy Consult for Electrolyte Monitoring and Replacement   Recent Labs: Potassium (mmol/L)  Date Value  01/21/2021 5.7 (H)   Magnesium (mg/dL)  Date Value  67/20/9470 2.4   Calcium (mg/dL)  Date Value  96/28/3662 8.3 (L)   Albumin (g/dL)  Date Value  94/76/5465 2.1 (L)   Phosphorus (mg/dL)  Date Value  03/54/6568 4.8 (H)   Sodium (mmol/L)  Date Value  01/21/2021 139   Assessment: 55 yo F with COVID-19. PMH includes HTN, diabetes, and IBS. Pt intubated 2/4. Pharmacy consulted for electrolyte monitoring and replacement.   Goal of Therapy:  Electrolytes WNL  Plan:  Treated for K 5.7 earlier today. Continue to follow along.  Pricilla Riffle, PharmD Clinical Pharmacist  01/21/2021 3:06 PM

## 2021-01-21 NOTE — Consult Note (Signed)
Pharmacy Antibiotic Note  Dana Bruce is a 55 y.o. female admitted on February 01, 2021 with pneumonia.  Pharmacy has been consulted for Vancomycin dosing.  Plan: Change vancomycin to 1500 mg IV q24h Expected AUC 445 Scr used 1.55  Continues on cefepime 2g IV q8h  S/p 5days of levofloxacin 750mg  q24h  Height: 5\' 7"  (170.2 cm) Weight: (!) 179.6 kg (395 lb 15.1 oz) IBW/kg (Calculated) : 61.6  Temp (24hrs), Avg:99 F (37.2 C), Min:98.06 F (36.7 C), Max:101.1 F (38.4 C)  Recent Labs  Lab 01/16/21 0220 01/17/21 0429 01/18/21 0458 01/19/21 0732 01/21/21 0425  WBC 11.0* 11.4* 9.9 9.3 9.9  CREATININE 1.49* 1.85* 2.05* 1.66* 1.55*    Estimated Creatinine Clearance: 70.4 mL/min (A) (by C-G formula based on SCr of 1.55 mg/dL (H)).    Allergies  Allergen Reactions  . Lasix [Furosemide] Itching  . Penicillins Hives and Itching    Has patient had a PCN reaction causing immediate rash, facial/tongue/throat swelling, SOB or lightheadedness with hypotension: yes Has patient had a PCN reaction causing severe rash involving mucus membranes or skin necrosis: no Has patient had a PCN reaction that required hospitalization No Has patient had a PCN reaction occurring within the last 10 years: No If all of the above answers are "NO", then may proceed with Cephalosporin use.   03/19/21 Flavor [Flavoring Agent] Itching, Nausea And Vomiting and Rash    Breaks out in places where she touches strawberries     Antimicrobials this admission: Vancomycin (2/9 >> ongoing Cefepime (2/8 >> Levofloxacin (2/4 >> 2/8)  Microbiology results: 2/3 BCx: NG5D 2/4 Resp Cx: normal flora 2/8 Resp Cx: pending  2/8 BCx: GPC - MRSE in 1 of 4  Thank you for allowing pharmacy to be a part of this patient's care.  4/8, PharmD 01/21/2021 3:16 PM

## 2021-01-22 DIAGNOSIS — U071 COVID-19: Secondary | ICD-10-CM | POA: Diagnosis not present

## 2021-01-22 DIAGNOSIS — J80 Acute respiratory distress syndrome: Secondary | ICD-10-CM | POA: Diagnosis not present

## 2021-01-22 LAB — CULTURE, RESPIRATORY W GRAM STAIN: Culture: NORMAL

## 2021-01-22 LAB — MAGNESIUM: Magnesium: 2.4 mg/dL (ref 1.7–2.4)

## 2021-01-22 LAB — GLUCOSE, CAPILLARY
Glucose-Capillary: 263 mg/dL — ABNORMAL HIGH (ref 70–99)
Glucose-Capillary: 187 mg/dL — ABNORMAL HIGH (ref 70–99)
Glucose-Capillary: 186 mg/dL — ABNORMAL HIGH (ref 70–99)
Glucose-Capillary: 207 mg/dL — ABNORMAL HIGH (ref 70–99)
Glucose-Capillary: 193 mg/dL — ABNORMAL HIGH (ref 70–99)
Glucose-Capillary: 196 mg/dL — ABNORMAL HIGH (ref 70–99)

## 2021-01-22 LAB — CBC WITH DIFFERENTIAL/PLATELET
Abs Immature Granulocytes: 0.63 10*3/uL — ABNORMAL HIGH (ref 0.00–0.07)
Basophils Absolute: 0.1 10*3/uL (ref 0.0–0.1)
Basophils Relative: 1 %
Eosinophils Absolute: 0.1 10*3/uL (ref 0.0–0.5)
Eosinophils Relative: 1 %
HCT: 37.5 % (ref 36.0–46.0)
Hemoglobin: 11.4 g/dL — ABNORMAL LOW (ref 12.0–15.0)
Immature Granulocytes: 7 %
Lymphocytes Relative: 13 %
Lymphs Abs: 1.1 10*3/uL (ref 0.7–4.0)
MCH: 26.3 pg (ref 26.0–34.0)
MCHC: 30.4 g/dL (ref 30.0–36.0)
MCV: 86.6 fL (ref 80.0–100.0)
Monocytes Absolute: 0.9 10*3/uL (ref 0.1–1.0)
Monocytes Relative: 10 %
Neutro Abs: 5.9 10*3/uL (ref 1.7–7.7)
Neutrophils Relative %: 68 %
Platelets: 286 10*3/uL (ref 150–400)
RBC: 4.33 MIL/uL (ref 3.87–5.11)
RDW: 14.6 % (ref 11.5–15.5)
Smear Review: NORMAL
WBC: 8.7 10*3/uL (ref 4.0–10.5)
nRBC: 0 % (ref 0.0–0.2)

## 2021-01-22 LAB — RENAL FUNCTION PANEL
Albumin: 2.2 g/dL — ABNORMAL LOW (ref 3.5–5.0)
Anion gap: 10 (ref 5–15)
BUN: 67 mg/dL — ABNORMAL HIGH (ref 6–20)
CO2: 23 mmol/L (ref 22–32)
Calcium: 8.9 mg/dL (ref 8.9–10.3)
Chloride: 109 mmol/L (ref 98–111)
Creatinine, Ser: 1.16 mg/dL — ABNORMAL HIGH (ref 0.44–1.00)
GFR, Estimated: 56 mL/min — ABNORMAL LOW (ref >60)
Glucose, Bld: 258 mg/dL — ABNORMAL HIGH (ref 70–99)
Phosphorus: 2.7 mg/dL (ref 2.5–4.6)
Potassium: 5.8 mmol/L — ABNORMAL HIGH (ref 3.5–5.1)
Sodium: 142 mmol/L (ref 135–145)

## 2021-01-22 LAB — POTASSIUM
Potassium: 4.7 mmol/L (ref 3.5–5.1)
Potassium: 5.6 mmol/L — ABNORMAL HIGH (ref 3.5–5.1)

## 2021-01-22 LAB — C-REACTIVE PROTEIN: CRP: 4.3 mg/dL — ABNORMAL HIGH (ref <1.0)

## 2021-01-22 LAB — FERRITIN: Ferritin: 202 ng/mL (ref 11–307)

## 2021-01-22 LAB — FIBRIN DERIVATIVES D-DIMER (ARMC ONLY): Fibrin derivatives D-dimer (ARMC): 869.53 ng/mL (FEU) — ABNORMAL HIGH (ref 0.00–499.00)

## 2021-01-22 MED ORDER — SODIUM ZIRCONIUM CYCLOSILICATE 5 G PO PACK
10.0000 g | PACK | Freq: Every day | ORAL | Status: DC
  Administered 2021-01-22: 10 g
  Filled 2021-01-22: qty 2

## 2021-01-22 MED ORDER — NEPRO/CARBSTEADY PO LIQD
1000.0000 mL | ORAL | Status: DC
  Administered 2021-01-22 – 2021-01-26 (×5): 1000 mL

## 2021-01-22 MED ORDER — SODIUM ZIRCONIUM CYCLOSILICATE 5 G PO PACK
10.0000 g | PACK | Freq: Three times a day (TID) | ORAL | Status: DC
  Administered 2021-01-22: 10 g
  Filled 2021-01-22: qty 2

## 2021-01-22 MED ORDER — METOPROLOL TARTRATE 50 MG PO TABS
100.0000 mg | ORAL_TABLET | Freq: Two times a day (BID) | ORAL | Status: DC
  Administered 2021-01-22 – 2021-01-30 (×14): 100 mg
  Filled 2021-01-22 (×15): qty 2

## 2021-01-22 MED ORDER — INSULIN ASPART 100 UNIT/ML IV SOLN
10.0000 [IU] | Freq: Once | INTRAVENOUS | Status: AC
  Administered 2021-01-22: 10 [IU] via INTRAVENOUS
  Filled 2021-01-22: qty 0.1

## 2021-01-22 MED ORDER — DEXTROSE 50 % IV SOLN
12.5000 g | INTRAVENOUS | Status: AC
  Administered 2021-01-22: 12.5 g via INTRAVENOUS

## 2021-01-22 MED ORDER — DEXTROSE 50 % IV SOLN
1.0000 | Freq: Once | INTRAVENOUS | Status: AC
  Administered 2021-01-22: 50 mL via INTRAVENOUS
  Filled 2021-01-22: qty 50

## 2021-01-22 MED ORDER — PROSOURCE TF PO LIQD
45.0000 mL | Freq: Four times a day (QID) | ORAL | Status: DC
  Administered 2021-01-22 – 2021-01-27 (×20): 45 mL
  Filled 2021-01-22 (×23): qty 45

## 2021-01-22 MED ORDER — AMLODIPINE BESYLATE 10 MG PO TABS
10.0000 mg | ORAL_TABLET | Freq: Every day | ORAL | Status: DC
  Administered 2021-01-22 – 2021-01-28 (×6): 10 mg
  Filled 2021-01-22 (×7): qty 1

## 2021-01-22 MED ORDER — VANCOMYCIN HCL 2000 MG/400ML IV SOLN
2000.0000 mg | INTRAVENOUS | Status: DC
  Administered 2021-01-23: 2000 mg via INTRAVENOUS
  Filled 2021-01-22: qty 400

## 2021-01-22 NOTE — Consult Note (Signed)
Pharmacy Antibiotic Note  Dana Bruce is a 55 y.o. female admitted on 01/13/2021 with pneumonia. Patient was originally started on vancomycin which was changed to cefepime on rounds. Blood cultures returned with 1/2 sets MRSE, which could likely be a contaminant. Because patient was still spiking fevers, vancomycin was added back to regimen. Patient continues to have fevers. Repeat blood cultures have been ordered. Pharmacy has been consulted for Vancomycin dosing.  Plan: Change vancomycin to 2000 mg IV q24h Expected AUC 447 Scr used 1.16  Continues on cefepime 2g IV q8h  Follow repeat blood cultures and renal function.  Height: 5\' 7"  (170.2 cm) Weight: (!) 183.7 kg (405 lb) IBW/kg (Calculated) : 61.6  Temp (24hrs), Avg:99.7 F (37.6 C), Min:98.42 F (36.9 C), Max:100.4 F (38 C)  Recent Labs  Lab 01/17/21 0429 01/18/21 0458 01/19/21 0732 01/21/21 0425 01/21/21 1500 01/22/21 0316  WBC 11.4* 9.9 9.3 9.9  --  8.7  CREATININE 1.85* 2.05* 1.66* 1.55* 1.21* 1.16*    Estimated Creatinine Clearance: 95.5 mL/min (A) (by C-G formula based on SCr of 1.16 mg/dL (H)).    Allergies  Allergen Reactions  . Lasix [Furosemide] Itching  . Penicillins Hives and Itching    Has patient had a PCN reaction causing immediate rash, facial/tongue/throat swelling, SOB or lightheadedness with hypotension: yes Has patient had a PCN reaction causing severe rash involving mucus membranes or skin necrosis: no Has patient had a PCN reaction that required hospitalization No Has patient had a PCN reaction occurring within the last 10 years: No If all of the above answers are "NO", then may proceed with Cephalosporin use.   03/22/21 Flavor [Flavoring Agent] Itching, Nausea And Vomiting and Rash    Breaks out in places where she touches strawberries     Antimicrobials this admission: Vancomycin 2/9 >> Cefepime 2/8 >> Levofloxacin 2/4 >> 2/8  Microbiology results: 2/3 BCx: NG5D 2/4 Resp Cx:  normal flora 2/8 Resp Cx: normal flora  2/8 BCx: GPC - MRSE in 1 of 4 2/11 BCx: pending  Thank you for allowing pharmacy to be a part of this patient's care.  4/11, PharmD 01/22/2021 12:01 PM

## 2021-01-22 NOTE — Progress Notes (Signed)
CRITICAL CARE NOTE 55 year old unvaccinated female admitted with acute hypoxic respiratory failure in the setting of COVID-19 pneumonia   Subjective Findings:  01/15/2021-patient with worsening resp distress s/p ETT. Has been started on lasix. Due to BMI >62 will hold proning. Patient with resp failure s/p ETT - I have updated husband [name] via phone today. 01/16/2021-patient was awake and had attempted to self extubate, sedation was increased. Midline was placed due to inadequate access no need for central line at this time. AM labs with worsening GFR, hyperglycemia. Vitals stable. UOP adequate appx 1L overnight, CXR with bilateral multifocal infiltrates. Ventilator management performed.  01/17/2021-patient has been weaned on MV from FIO2-100% to 70%, repeat ABG this afternoon. Called husband today (POA Joia Doyle) reviewed care plan and answered questions.  01/19/2021-appears volume overloaded, still with increased work of breathing when sedation is lightened 2/9 FAILURE TO WEAN FROM VENT, SEVERE ALI 2/10 severe resp failure, failure to wean from vent  Significant Diagnostic Tests:  2/3: Chest x-ray>>IMPRESSION: Diffuse bilateral ground-glass airspace opacities consistent with the patient's history of viral pneumonia. 2/8:Chest x-ray>>improved aeration from prior  Micro Data:  1/30: SARS-CoV-2 PCR>> positive 2/3: Blood culture x2>> 2/3: Sputum>> 2/3: Strep pneumo urinary antigen>>NEG 2/3: Legionella urinary antigen>>NEG 2/8 [facility] EPI started vancomycin Antimicrobials:  Remdesivir 2/3>>2/8 Levaquin 2/3>>2/9 Cefepime and Vanc 2/9>>       CC  follow up respiratory failure  SUBJECTIVE Patient remains critically ill Prognosis is guarded   Vent Mode: PRVC FiO2 (%):  [30 %-40 %] 30 % Set Rate:  [30 bmp] 30 bmp Vt Set:  [500 mL] 500 mL PEEP:  [5 cmH20-8 cmH20] 5 cmH20 Delta P (Amplitude):  [28] 28 Plateau Pressure:  [22 cmH20] 22 cmH20  CBC    Component Value  Date/Time   WBC 8.7 01/22/2021 0316   RBC 4.33 01/22/2021 0316   HGB 11.4 (L) 01/22/2021 0316   HCT 37.5 01/22/2021 0316   PLT 286 01/22/2021 0316   MCV 86.6 01/22/2021 0316   MCH 26.3 01/22/2021 0316   MCHC 30.4 01/22/2021 0316   RDW 14.6 01/22/2021 0316   LYMPHSABS 1.1 01/22/2021 0316   MONOABS 0.9 01/22/2021 0316   EOSABS 0.1 01/22/2021 0316   BASOSABS 0.1 01/22/2021 0316   BMP Latest Ref Rng & Units 01/22/2021 01/21/2021 01/21/2021  Glucose 70 - 99 mg/dL 258(H) - 307(H)  BUN 6 - 20 mg/dL 67(H) - 72(H)  Creatinine 0.44 - 1.00 mg/dL 1.16(H) - 1.21(H)  Sodium 135 - 145 mmol/L 142 - 139  Potassium 3.5 - 5.1 mmol/L 5.8(H) 5.3(H) 6.0(H)  Chloride 98 - 111 mmol/L 109 - 108  CO2 22 - 32 mmol/L 23 - 21(L)  Calcium 8.9 - 10.3 mg/dL 8.9 - 8.6(L)     BP (!) 150/69   Pulse 75   Temp 99.7 F (37.6 C) (Esophageal)   Resp 17   Ht 5' 7" (1.702 m)   Wt (!) 183.7 kg   SpO2 92%   BMI 63.43 kg/m    I/O last 3 completed shifts: In: 5229.2 [I.V.:1794.6; NG/GT:2600; IV Piggyback:834.6] Out: 4700 [Urine:4700] No intake/output data recorded.  SpO2: 92 % O2 Flow Rate (L/min): 45 L/min FiO2 (%): 30 %  Estimated body mass index is 63.43 kg/m as calculated from the following:   Height as of this encounter: 5' 7" (1.702 m).   Weight as of this encounter: 183.7 kg.  SIGNIFICANT EVENTS   REVIEW OF SYSTEMS  PATIENT IS UNABLE TO PROVIDE COMPLETE REVIEW OF SYSTEMS DUE TO SEVERE  CRITICAL ILLNESS       PHYSICAL EXAMINATION: GENERAL:critically ill appearing, +resp distress NECK: Supple.  PULMONARY: +rhonchi, +wheezing CARDIOVASCULAR: S1 and S2.  GASTROINTESTINAL: Soft, nontender, +Positive bowel sounds.  MUSCULOSKELETAL: No swelling, clubbing, or edema.  NEUROLOGIC: obtunded, GCS<8 SKIN:intact,warm,dry     MEDICATIONS: I have reviewed all medications and confirmed regimen as documented   CULTURE RESULTS   Recent Results (from the past 240 hour(s))  Blood Culture (routine x  2)     Status: None   Collection Time: 01/20/2021  2:56 PM   Specimen: BLOOD  Result Value Ref Range Status   Specimen Description BLOOD BLOOD LEFT ARM  Final   Special Requests   Final    BOTTLES DRAWN AEROBIC AND ANAEROBIC Blood Culture results may not be optimal due to an inadequate volume of blood received in culture bottles   Culture   Final    NO GROWTH 5 DAYS Performed at [facility], [address], [place], [place] [address]    Report Status 01/19/2021 FINAL  Final  Blood Culture (routine x 2)     Status: None   Collection Time: 01/21/2021  2:59 PM   Specimen: BLOOD  Result Value Ref Range Status   Specimen Description BLOOD BLOOD LEFT FOREARM  Final   Special Requests   Final    BOTTLES DRAWN AEROBIC AND ANAEROBIC Blood Culture adequate volume   Culture   Final    NO GROWTH 5 DAYS Performed at [facility], [address] [place], [place] [address]    Report Status 01/19/2021 FINAL  Final  Culture, respiratory     Status: None   Collection Time: 01/15/21  5:50 PM   Specimen: Tracheal Aspirate; Respiratory  Result Value Ref Range Status   Specimen Description   Final    TRACHEAL ASPIRATE Performed at [facility], [address] [place], [place] [address]    Special Requests   Final    NONE Performed at [facility], [address] [place], [place] [address]    Gram Stain   Final    FEW WBC PRESENT, PREDOMINANTLY PMN FEW GRAM NEGATIVE RODS FEW GRAM POSITIVE RODS RARE GRAM POSITIVE COCCI    Culture   Final    Normal respiratory flora-no Staph aureus or Pseudomonas seen Performed at [facility], [place] [address] [place], [place] [address]    Report Status 01/18/2021 FINAL  Final  Culture, respiratory (non-expectorated)     Status: None (Preliminary result)   Collection Time: 01/19/21  6:20 PM   Specimen: Tracheal Aspirate; Respiratory  Result Value Ref Range Status   Specimen Description   Final    TRACHEAL  ASPIRATE Performed at [facility], [address] [place], [place] [address]    Special Requests   Final    NONE Performed at [facility], [address] [place], [place] [address]    Gram Stain   Final    ABUNDANT WBC PRESENT, PREDOMINANTLY PMN FEW GRAM POSITIVE COCCI RARE GRAM NEGATIVE RODS RARE GRAM POSITIVE RODS    Culture   Final    CULTURE REINCUBATED FOR BETTER GROWTH Performed at [facility], [place] [address] [place],  [address]    Report Status PENDING  Incomplete  CULTURE, BLOOD (ROUTINE X 2) w Reflex to ID Panel     Status: None (Preliminary result)   Collection Time: 01/19/21  6:45 PM   Specimen: BLOOD  Result Value Ref Range Status  Specimen Description   Final    BLOOD BLOOD LEFT HAND Performed at [facility], [address] [place], [place] [address]    Special Requests   Final    BOTTLES DRAWN AEROBIC AND ANAEROBIC Blood Culture adequate volume Performed at [facility], [address] [place], [place] [address]    Culture  Setup Time   Final    GRAM POSITIVE COCCI IN BOTH AEROBIC AND ANAEROBIC BOTTLES CRITICAL RESULT CALLED TO, READ BACK BY AND VERIFIED WITH: BRANDON BEERS AT 0254 ON 01/20/21 BY SS Performed at [facility], [place] [address], [place], [place] [address]    Culture GRAM POSITIVE COCCI  Final   Report Status PENDING  Incomplete  Blood Culture ID Panel (Reflexed)     Status: Abnormal   Collection Time: 01/19/21  6:45 PM  Result Value Ref Range Status   Enterococcus faecalis NOT DETECTED NOT DETECTED Final   Enterococcus Faecium NOT DETECTED NOT DETECTED Final   Listeria monocytogenes NOT DETECTED NOT DETECTED Final   Staphylococcus species DETECTED (A) NOT DETECTED Final    Comment: CRITICAL RESULT CALLED TO, READ BACK BY AND VERIFIED WITH: BRANDON BEERS AT 1539 ON 01/20/21 BY SS    Staphylococcus aureus (BCID) NOT DETECTED NOT DETECTED Final   Staphylococcus epidermidis  DETECTED (A) NOT DETECTED Final    Comment: Methicillin (oxacillin) resistant coagulase negative staphylococcus. Possible blood culture contaminant (unless isolated from more than one blood culture draw or clinical case suggests pathogenicity). No antibiotic treatment is indicated for blood  culture contaminants. CRITICAL RESULT CALLED TO, READ BACK BY AND VERIFIED WITH: BRANDON BEERS AT 3762 ON 01/20/21 BY SS    Staphylococcus lugdunensis NOT DETECTED NOT DETECTED Final   Streptococcus species NOT DETECTED NOT DETECTED Final   Streptococcus agalactiae NOT DETECTED NOT DETECTED Final   Streptococcus pneumoniae NOT DETECTED NOT DETECTED Final   Streptococcus pyogenes NOT DETECTED NOT DETECTED Final   A.calcoaceticus-baumannii NOT DETECTED NOT DETECTED Final   Bacteroides fragilis NOT DETECTED NOT DETECTED Final   Enterobacterales NOT DETECTED NOT DETECTED Final   Enterobacter cloacae complex NOT DETECTED NOT DETECTED Final   Escherichia coli NOT DETECTED NOT DETECTED Final   Klebsiella aerogenes NOT DETECTED NOT DETECTED Final   Klebsiella oxytoca NOT DETECTED NOT DETECTED Final   Klebsiella pneumoniae NOT DETECTED NOT DETECTED Final   Proteus species NOT DETECTED NOT DETECTED Final   Salmonella species NOT DETECTED NOT DETECTED Final   Serratia marcescens NOT DETECTED NOT DETECTED Final   Haemophilus influenzae NOT DETECTED NOT DETECTED Final   Neisseria meningitidis NOT DETECTED NOT DETECTED Final   Pseudomonas aeruginosa NOT DETECTED NOT DETECTED Final   Stenotrophomonas maltophilia NOT DETECTED NOT DETECTED Final   Candida albicans NOT DETECTED NOT DETECTED Final   Candida auris NOT DETECTED NOT DETECTED Final   Candida glabrata NOT DETECTED NOT DETECTED Final   Candida krusei NOT DETECTED NOT DETECTED Final   Candida parapsilosis NOT DETECTED NOT DETECTED Final   Candida tropicalis NOT DETECTED NOT DETECTED Final   Cryptococcus neoformans/gattii NOT DETECTED NOT DETECTED Final    Methicillin resistance mecA/C DETECTED (A) NOT DETECTED Final    Comment: CRITICAL RESULT CALLED TO, READ BACK BY AND VERIFIED WITH: BRANDON BEERS AT 1539 ON 01/20/21 BY SS Performed at [facility], [address] [place], [place] [address]   CULTURE, BLOOD (ROUTINE X 2) w Reflex to ID Panel     Status: None (Preliminary result)   Collection Time: 01/19/21  6:47 PM   Specimen: BLOOD  Result Value Ref Range Status   Specimen Description BLOOD BLOOD RIGHT HAND  Final   Special Requests   Final    BOTTLES DRAWN AEROBIC AND ANAEROBIC Blood Culture adequate volume   Culture   Final    NO GROWTH 3 DAYS Performed at [facility], [address], [place], [place] [address]    Report Status PENDING  Incomplete  MRSA PCR Screening     Status: None   Collection Time: 01/20/21  7:58 AM   Specimen: Nasal Mucosa; Nasopharyngeal  Result Value Ref Range Status   MRSA by PCR NEGATIVE NEGATIVE Final    Comment:        The GeneXpert MRSA Assay (FDA approved for NASAL specimens only), is one component of a comprehensive MRSA colonization surveillance program. It is not intended to diagnose MRSA infection nor to guide or monitor treatment for MRSA infections. Performed at [facility], [address], [place], [place] [address]           IMAGING    No results found.   Nutrition Status: Nutrition Problem: Inadequate oral intake Etiology: inability to eat (pt sedated and ventilated) Signs/Symptoms: NPO status       Indwelling Urinary Catheter continued, requirement due to   Reason to continue Indwelling Urinary Catheter strict Intake/Output monitoring for hemodynamic instability   Central Line/ continued, requirement due to  Reason to continue Bates of central venous pressure or other hemodynamic parameters and poor IV access   Ventilator continued, requirement due to severe respiratory failure   Ventilator Sedation RASS 0 to -2       ASSESSMENT AND PLAN SYNOPSIS   55 year old unvaccinated female admitted with acute hypoxic respiratory failure in the setting of COVID-19 pneumonia   Acute hypoxemic respiratory failure due to COVID-19 pneumonia/ARDS Mechanical ventilation via ARDS protocol, target PRVC 6 cc/kg Wean PEEP and FiO2 as able Goal plateau pressure less than 30, driving pressure less than 15 Paralytics if necessary for vent synchrony, gas exchange Deep sedation per PAD protocol Diuresis as tolerated based on Kidney function VAP prevention order set IV STEROIDS Therapy Follow inflammatory markers as needed with CRP Vitamin C, zinc Plan to repeat and check resp cultures as needed   Severe ACUTE Hypoxic and Hypercapnic Respiratory Failure -continue Full MV support -continue Bronchodilator Therapy -Wean Fio2 and PEEP as tolerated -will perform SAT/SBT when respiratory parameters are met -VAP/VENT bundle implementation Patient has failed weaning trials last several days  ACUTE DIASTOLIC CARDIAC FAILURE-  -oxygen as needed -Lasix as tolerated   Morbid obesity, possible OSA.   Will certainly impact respiratory mechanics, ventilator weaning Suspect will need to consider additional PEEP   ACUTE KIDNEY INJURY/Renal Failure -continue Foley Catheter-assess need -Avoid nephrotoxic agents -Follow urine output, BMP -Ensure adequate renal perfusion, optimize oxygenation -Renal dose medications     NEUROLOGY Acute toxic metabolic encephalopathy WUA  CARDIAC ICU monitoring  ID -continue IV abx as prescibed -follow up cultures  GI GI PROPHYLAXIS as indicated   DIET-->TF's as tolerated Constipation protocol as indicated  ENDO - will use ICU hypoglycemic\Hyperglycemia protocol if indicated    ELECTROLYTES -follow labs as needed -replace as needed -pharmacy consultation and following   DVT/GI PRX ordered and assessed TRANSFUSIONS AS NEEDED MONITOR FSBS I Assessed the need  for Labs I Assessed the need for Foley I Assessed the need for Central Venous Line Family Discussion when available I Assessed the need for Mobilization I made  an Assessment of medications to be adjusted accordingly Safety Risk assessment completed   CASE DISCUSSED IN MULTIDISCIPLINARY ROUNDS WITH ICU TEAM  Critical Care Time devoted to patient care services described in this note is 52 minutes.   Overall, patient is critically ill, prognosis is guarded.  Patient with Multiorgan failure and at high risk for cardiac arrest and death.    Corrin Parker, M.D.  [facility]  Medical Director [place] Director [facility] Cardio-Pulmonary Department

## 2021-01-22 NOTE — Consult Note (Signed)
PHARMACY CONSULT NOTE - FOLLOW UP  Pharmacy Consult for Electrolyte Monitoring and Replacement   Recent Labs: Potassium (mmol/L)  Date Value  01/22/2021 4.7   Magnesium (mg/dL)  Date Value  97/58/8325 2.4   Calcium (mg/dL)  Date Value  49/82/6415 8.9   Albumin (g/dL)  Date Value  83/08/4075 2.2 (L)   Phosphorus (mg/dL)  Date Value  80/88/1103 2.7   Sodium (mmol/L)  Date Value  01/22/2021 142   Assessment: 55 yo F with COVID-19. PMH includes HTN, diabetes, and IBS. Pt intubated 2/4. Pharmacy consulted for electrolyte monitoring and replacement.   Goal of Therapy:  Electrolytes WNL  Plan:  Persistent hyperkalemia the past few days requiring treatment with IV insulin. Order for [facility] daily. Will follow potassium trend and adjust medications as indicated. Continue to follow along.  Pricilla Riffle, PharmD Clinical Pharmacist  01/22/2021 11:57 AM

## 2021-01-22 NOTE — Progress Notes (Signed)
Nutrition Follow-up  DOCUMENTATION CODES:   Morbid obesity  INTERVENTION:   Change to Nepro 1.8 @ 41m/hr- Initiate at 236mhr and increase by 1057mr q 8 hours until goal rate is reached.   Pro-Source 95m34mD via tube, provides 40kcal and 11g of protein per serving   Free water flushes 200ml68mhours per MD  Regimen provides 2536kcal/day, 151g/day protein, 16g/day fiber and 2160ml/20mfree water.   NUTRITION DIAGNOSIS:   Inadequate oral intake related to inability to eat (pt sedated and ventilated) as evidenced by NPO status. Ongoing.  GOAL:   Provide needs based on ASPEN/SCCM guidelines Met with TF regimen.  MONITOR:   Vent status,Labs,Weight trends,Skin,I & O's, tube feeds   ASSESSMENT:   54 y/o71emale with h/o DM, HTN, IBS, DM, goiter and OSA who is admitted with COVID 19.   2/4 intubated  Pt remains sedated and ventilated. Pt tolerating tube feeds well at goal rate. RD will change pt to Nepro per MD request in the setting of hyperkalemia. Per chart, pt is down ~9lbs since admit.    Medications reviewed and include: vitamin C, lovenox, insulin, lactulose, solu-medrol, miralax, senokot, lokelma, cefepime, precedex, pepcid, fenatnyl, vancomycin  Labs reviewed: K 4.7 wnl, BUN 67(H), creat 1.16(H), P 2.7 wnl, Mg 2.4 wnl cbgs- 263, 187, 186 x 24 hrs  Patient is currently intubated on ventilator support MV: 12.5 L/min Temp (24hrs), Avg:99.6 F (37.6 C), Min:98.06 F (36.7 C), Max:100.4 F (38 C)  Propofol: none   MAP- >65mmHg57mP- 3275ml   81m Order:   Diet Order            Diet NPO time specified  Diet effective now                EDUCATION NEEDS:   No education needs have been identified at this time  Skin:  Skin Assessment: Reviewed RN Assessment  Last BM:  2/11- type 7  Height:   Ht Readings from Last 1 Encounters:  02/08/2021 5' 7"  (1.702 m)   Weight:   Wt Readings from Last 1 Encounters:  01/22/21 (!) 183.7 kg   Ideal Body  Weight:  61.36 kg  BMI:  Body mass index is 63.43 kg/m.  Estimated Nutritional Needs:   Kcal:  2400-2600  Protein:  150-160 grams  Fluid:  >/= 2 L/day  Estephany Perot CaKoleen Distance LDN Please refer to [facility] fo[facility] Rehabilitationand/or RD on-call/weekend/after hours pager

## 2021-01-22 NOTE — Progress Notes (Signed)
GOALS OF CARE DISCUSSION  The Clinical status was relayed to family in detail. Husband   Updated and notified of patients medical condition.  Patient remains unresponsive and will not open eyes to command.    Patient is having a weak cough and struggling to remove secretions.   Patient with increased WOB and using accessory muscles to breathe Explained to family course of therapy and the modalities     Patient with Progressive multiorgan failure with a very high probablity of a very minimal chance of meaningful recovery despite all aggressive and optimal medical therapy.  Family understands the situation.  Patient remains FULL CODE  Family are satisfied with Plan of action and management. All questions answered  Additional CC time 32 mins   Tonio Seider Santiago Glad, M.D.  [facility]  Medical Director [facility] Medical Director [facility] Cardio-Pulmonary Department

## 2021-01-23 ENCOUNTER — Inpatient Hospital Stay: Payer: Self-pay

## 2021-01-23 DIAGNOSIS — U071 COVID-19: Secondary | ICD-10-CM | POA: Diagnosis not present

## 2021-01-23 DIAGNOSIS — J80 Acute respiratory distress syndrome: Secondary | ICD-10-CM | POA: Diagnosis not present

## 2021-01-23 LAB — CBC WITH DIFFERENTIAL/PLATELET
Abs Immature Granulocytes: 0.4 10*3/uL — ABNORMAL HIGH (ref 0.00–0.07)
Basophils Absolute: 0.1 10*3/uL (ref 0.0–0.1)
Basophils Relative: 1 %
Eosinophils Absolute: 0.2 10*3/uL (ref 0.0–0.5)
Eosinophils Relative: 2 %
HCT: 35.5 % — ABNORMAL LOW (ref 36.0–46.0)
Hemoglobin: 10.6 g/dL — ABNORMAL LOW (ref 12.0–15.0)
Immature Granulocytes: 4 %
Lymphocytes Relative: 12 %
Lymphs Abs: 1.1 10*3/uL (ref 0.7–4.0)
MCH: 25.7 pg — ABNORMAL LOW (ref 26.0–34.0)
MCHC: 29.9 g/dL — ABNORMAL LOW (ref 30.0–36.0)
MCV: 86.2 fL (ref 80.0–100.0)
Monocytes Absolute: 0.9 10*3/uL (ref 0.1–1.0)
Monocytes Relative: 10 %
Neutro Abs: 6.9 10*3/uL (ref 1.7–7.7)
Neutrophils Relative %: 71 %
Platelets: 269 10*3/uL (ref 150–400)
RBC: 4.12 MIL/uL (ref 3.87–5.11)
RDW: 14.6 % (ref 11.5–15.5)
WBC: 9.6 10*3/uL (ref 4.0–10.5)
nRBC: 0 % (ref 0.0–0.2)

## 2021-01-23 LAB — CULTURE, BLOOD (ROUTINE X 2): Special Requests: ADEQUATE

## 2021-01-23 LAB — RENAL FUNCTION PANEL
Albumin: 2.3 g/dL — ABNORMAL LOW (ref 3.5–5.0)
Anion gap: 8 (ref 5–15)
BUN: 61 mg/dL — ABNORMAL HIGH (ref 6–20)
CO2: 23 mmol/L (ref 22–32)
Calcium: 8.9 mg/dL (ref 8.9–10.3)
Chloride: 109 mmol/L (ref 98–111)
Creatinine, Ser: 0.95 mg/dL (ref 0.44–1.00)
GFR, Estimated: >60 mL/min (ref >60)
Glucose, Bld: 239 mg/dL — ABNORMAL HIGH (ref 70–99)
Phosphorus: 3.7 mg/dL (ref 2.5–4.6)
Potassium: 5.6 mmol/L — ABNORMAL HIGH (ref 3.5–5.1)
Sodium: 140 mmol/L (ref 135–145)

## 2021-01-23 LAB — GLUCOSE, CAPILLARY
Glucose-Capillary: 197 mg/dL — ABNORMAL HIGH (ref 70–99)
Glucose-Capillary: 191 mg/dL — ABNORMAL HIGH (ref 70–99)
Glucose-Capillary: 181 mg/dL — ABNORMAL HIGH (ref 70–99)
Glucose-Capillary: 249 mg/dL — ABNORMAL HIGH (ref 70–99)
Glucose-Capillary: 185 mg/dL — ABNORMAL HIGH (ref 70–99)
Glucose-Capillary: 207 mg/dL — ABNORMAL HIGH (ref 70–99)

## 2021-01-23 LAB — C-REACTIVE PROTEIN: CRP: 2.7 mg/dL — ABNORMAL HIGH (ref <1.0)

## 2021-01-23 LAB — FERRITIN: Ferritin: 171 ng/mL (ref 11–307)

## 2021-01-23 LAB — POTASSIUM
Potassium: 5.5 mmol/L — ABNORMAL HIGH (ref 3.5–5.1)
Potassium: 4.4 mmol/L (ref 3.5–5.1)
Potassium: 4.9 mmol/L (ref 3.5–5.1)

## 2021-01-23 LAB — MAGNESIUM: Magnesium: 2 mg/dL (ref 1.7–2.4)

## 2021-01-23 LAB — FIBRIN DERIVATIVES D-DIMER (ARMC ONLY): Fibrin derivatives D-dimer (ARMC): 858.52 ng/mL (FEU) — ABNORMAL HIGH (ref 0.00–499.00)

## 2021-01-23 MED ORDER — BUMETANIDE 0.25 MG/ML IJ SOLN
1.0000 mg | Freq: Once | INTRAMUSCULAR | Status: AC
  Administered 2021-01-23: 1 mg via INTRAVENOUS
  Filled 2021-01-23: qty 4

## 2021-01-23 MED ORDER — VANCOMYCIN HCL 1250 MG/250ML IV SOLN
1250.0000 mg | Freq: Two times a day (BID) | INTRAVENOUS | Status: DC
  Administered 2021-01-23 – 2021-01-24 (×2): 1250 mg via INTRAVENOUS
  Filled 2021-01-23 (×3): qty 250

## 2021-01-23 MED ORDER — SODIUM ZIRCONIUM CYCLOSILICATE 5 G PO PACK
10.0000 g | PACK | Freq: Two times a day (BID) | ORAL | Status: DC
  Administered 2021-01-23 (×2): 10 g
  Filled 2021-01-23 (×3): qty 2

## 2021-01-23 MED ORDER — SODIUM CHLORIDE 0.9% FLUSH: 10.0000 mL | INTRAVENOUS | Status: DC | PRN

## 2021-01-23 MED ORDER — PROPOFOL 1000 MG/100ML IV EMUL
5.0000 ug/kg/min | INTRAVENOUS | Status: DC
  Administered 2021-01-23 (×3): 45 ug/kg/min via INTRAVENOUS
  Administered 2021-01-23: 40 ug/kg/min via INTRAVENOUS
  Administered 2021-01-23: 30 ug/kg/min via INTRAVENOUS
  Administered 2021-01-23: 45 ug/kg/min via INTRAVENOUS
  Administered 2021-01-23: 5 ug/kg/min via INTRAVENOUS
  Administered 2021-01-24 (×4): 45 ug/kg/min via INTRAVENOUS
  Filled 2021-01-23: qty 200
  Filled 2021-01-23: qty 100
  Filled 2021-01-23: qty 200
  Filled 2021-01-23 (×5): qty 100
  Filled 2021-01-23: qty 200

## 2021-01-23 MED ORDER — SODIUM POLYSTYRENE SULFONATE 15 GM/60ML PO SUSP
45.0000 g | Freq: Once | ORAL | Status: AC
  Administered 2021-01-23: 45 g
  Filled 2021-01-23: qty 180

## 2021-01-23 MED ORDER — SODIUM CHLORIDE 0.9% FLUSH
10.0000 mL | Freq: Two times a day (BID) | INTRAVENOUS | Status: DC
  Administered 2021-01-23: 10 mL
  Administered 2021-01-23 – 2021-01-24 (×2): 20 mL
  Administered 2021-01-24: 10 mL
  Administered 2021-01-25: 30 mL
  Administered 2021-01-25: 10 mL
  Administered 2021-01-26: 20 mL
  Administered 2021-01-26: 10 mL
  Administered 2021-01-27: 20 mL
  Administered 2021-01-27 – 2021-02-01 (×8): 10 mL

## 2021-01-23 NOTE — Progress Notes (Signed)
Peripherally Inserted Central Catheter Placement  The IV Nurse has discussed with the patient and/or persons authorized to consent for the patient, the purpose of this procedure and the potential benefits and risks involved with this procedure.  The benefits include less needle sticks, lab draws from the catheter, and the patient may be discharged home with the catheter. Risks include, but not limited to, infection, bleeding, blood clot (thrombus formation), and puncture of an artery; nerve damage and irregular heartbeat and possibility to perform a PICC exchange if needed/ordered by physician.  Alternatives to this procedure were also discussed.  Bard Power PICC patient education guide, fact sheet on infection prevention and patient information card has been provided to patient /or left at bedside.  Telephone consent obtained from husband, Vonna Kotyk, due to altered mental status;  PICC inserted by Chari Manning, RN  PICC Placement Documentation  PICC Double Lumen 01/23/21 PICC Right Cephalic 41 cm 0 cm (Active)  Indication for Insertion or Continuance of Line Prolonged intravenous therapies;Limited venous access - need for IV therapy >5 days (PICC only);Poor Vasculature-patient has had multiple peripheral attempts or PIVs lasting less than 24 hours 01/23/21 1308  Exposed Catheter (cm) 0 cm 01/23/21 1308  Site Assessment Clean;Dry;Intact 01/23/21 1308  Lumen #1 Status Flushed;Saline locked;Blood return noted 01/23/21 1308  Lumen #2 Status Flushed;Saline locked;Blood return noted 01/23/21 1308  Dressing Type Transparent 01/23/21 1308  Dressing Status Clean;Dry;Intact 01/23/21 1308  Antimicrobial disc in place? Yes 01/23/21 1308  Safety Lock Not Applicable 01/23/21 1308  Line Care Connections checked and tightened 01/23/21 1308  Line Adjustment (NICU/IV Team Only) No 01/23/21 1308  Dressing Intervention New dressing 01/23/21 1308  Dressing Change Due 01/30/21 01/23/21 1308       Aavya Shafer, Lajean Manes 01/23/2021, 1:09 PM

## 2021-01-23 NOTE — Consult Note (Signed)
PHARMACY CONSULT NOTE - FOLLOW UP  Pharmacy Consult for Electrolyte Monitoring and Replacement   Recent Labs: Potassium (mmol/L)  Date Value  01/23/2021 5.6 (H)  01/23/2021 5.5 (H)   Magnesium (mg/dL)  Date Value  06/10/1600 2.0   Calcium (mg/dL)  Date Value  09/32/3557 8.9   Albumin (g/dL)  Date Value  32/20/2542 2.3 (L)   Phosphorus (mg/dL)  Date Value  70/62/3762 3.7   Sodium (mmol/L)  Date Value  01/23/2021 140   Assessment: 55 yo F with COVID-19. PMH includes HTN, diabetes, and IBS. Pt intubated 2/4. Pharmacy consulted for electrolyte monitoring and replacement.   Goal of Therapy:  Electrolytes WNL  Plan:  2/12 0046  K 5.5  Mag 2.0  Phos 3.7  Scr 0.95 Persistent hyperkalemia the past few days requiring treatment with IV insulin.  Ordered Lokelma 10 gm packet TID x 5 doses starting 2/11 evening. -NP ordered Kayexelate 45 gm per tube this am x1   Will follow potassium trend and adjust medications as indicated. Continue to follow along.  Angelique Blonder, PharmD Clinical Pharmacist  01/23/2021 8:01 AM

## 2021-01-23 NOTE — Consult Note (Signed)
Pharmacy Antibiotic Note  Dana Bruce is a 55 y.o. female admitted on 02/05/2021 with covid+ pneumonia. Patient was originally started on vancomycin which was changed to cefepime on rounds. Blood cultures returned with 1/2 sets MRSE, which could likely be a contaminant. Because patient was still spiking fevers, vancomycin was added back to regimen. Patient continues to have fevers. Repeat blood cultures have been ordered. Pharmacy has been consulted for Vancomycin dosing.  -Failure to wean from vent x several days -Tmax 24 hrs 100.4  Plan: Scr improved 1.16 > 0.95 Change vancomycin from 2000 mg IV q24h to 1250 q12h Expected AUC 466 Cmin 14.6 Scr used 0.95 -obese patient- watch for accumulation  Continues on cefepime 2g IV q8h  Follow repeat blood cultures and renal function.  Height: 5\' 7"  (170.2 cm) Weight:  (bed not zeroed, pt weight not accurate) IBW/kg (Calculated) : 61.6  Temp (24hrs), Avg:99.1 F (37.3 C), Min:96.62 F (35.9 C), Max:100.4 F (38 C)  Recent Labs  Lab 01/18/21 0458 01/19/21 0732 01/21/21 0425 01/21/21 1500 01/22/21 0316 01/23/21 0046  WBC 9.9 9.3 9.9  --  8.7 9.6  CREATININE 2.05* 1.66* 1.55* 1.21* 1.16* 0.95    Estimated Creatinine Clearance: 116.6 mL/min (by C-G formula based on SCr of 0.95 mg/dL).    Allergies  Allergen Reactions  . Lasix [Furosemide] Itching  . Penicillins Hives and Itching    Has patient had a PCN reaction causing immediate rash, facial/tongue/throat swelling, SOB or lightheadedness with hypotension: yes Has patient had a PCN reaction causing severe rash involving mucus membranes or skin necrosis: no Has patient had a PCN reaction that required hospitalization No Has patient had a PCN reaction occurring within the last 10 years: No If all of the above answers are "NO", then may proceed with Cephalosporin use.   03/23/21 Flavor [Flavoring Agent] Itching, Nausea And Vomiting and Rash    Breaks out in places where she  touches strawberries     Antimicrobials this admission: Vancomycin 2/9 >> Cefepime 2/8 >> Levofloxacin 2/4 >> 2/8  Microbiology results: 2/3 BCx: NG5D 2/4 Resp Cx: normal flora 2/8 Resp Cx: normal flora  2/8 BCx: GPC - MRSE in 2 of 4 (1 set) 2/9 MRSA PCR neg 2/11 BCx:NG <24 hrs  Thank you for allowing pharmacy to be a part of this patient's care.  Jerzy Crotteau A, PharmD 01/23/2021 8:06 AM

## 2021-01-23 NOTE — Progress Notes (Signed)
Attempted to call husband to obtain consent for PICC.  Call unanswered no message left, will attempt again later.

## 2021-01-23 NOTE — Progress Notes (Signed)
CRITICAL CARE NOTE  55 year old unvaccinated female admitted with acute hypoxic respiratory failure in the setting of COVID-19 pneumonia   Subjective Findings:  01/15/2021-patient with worsening resp distress s/p ETT. Has been started on lasix. Due to BMI >62 will hold proning. Patient with resp failure s/p ETT - I have updated husband [name] via phone today. 01/16/2021-patient was awake and had attempted to self extubate, sedation was increased. Midline was placed due to inadequate access no need for central line at this time. AM labs with worsening GFR, hyperglycemia. Vitals stable. UOP adequate appx 1L overnight, CXR with bilateral multifocal infiltrates. Ventilator management performed.  01/17/2021-patient has been weaned on MV from FIO2-100% to 70%, repeat ABG this afternoon. Called husband today (POA Telesia Ates) reviewed care plan and answered questions.  01/19/2021-appears volume overloaded, still with increased work of breathing when sedation is lightened 2/9FAILURE TO WEAN FROM VENT, SEVERE ALI 2/10 severe resp failure, failure to wean from vent 2/11 failed weaning trials +hyperkalemia  Significant Diagnostic Tests:  2/3: Chest x-ray>>IMPRESSION: Diffuse bilateral ground-glass airspace opacities consistent with the patient's history of viral pneumonia. 2/8:Chest x-ray>>improved aeration from prior  Micro Data:  1/30: SARS-CoV-2 PCR>> positive 2/3: Blood culture x2>> 2/3: Sputum>> 2/3: Strep pneumo urinary antigen>>NEG 2/3: Legionella urinary antigen>>NEG 2/8 [facility] EPI started vancomycin Antimicrobials:  Remdesivir 2/3>>2/8 Levaquin 2/3>>2/9 Cefepime and Vanc 2/9>>     CC  follow up respiratory failure  SUBJECTIVE Patient remains critically ill Prognosis is guarded  Vent Mode: PRVC FiO2 (%):  [30 %-40 %] 40 % Set Rate:  [15 bmp-30 bmp] 15 bmp Vt Set:  [500 mL] 500 mL PEEP:  [5 cmH20] 5 cmH20  CBC    Component Value Date/Time   WBC 9.6 01/23/2021  0046   RBC 4.12 01/23/2021 0046   HGB 10.6 (L) 01/23/2021 0046   HCT 35.5 (L) 01/23/2021 0046   PLT 269 01/23/2021 0046   MCV 86.2 01/23/2021 0046   MCH 25.7 (L) 01/23/2021 0046   MCHC 29.9 (L) 01/23/2021 0046   RDW 14.6 01/23/2021 0046   LYMPHSABS 1.1 01/23/2021 0046   MONOABS 0.9 01/23/2021 0046   EOSABS 0.2 01/23/2021 0046   BASOSABS 0.1 01/23/2021 0046   BMP Latest Ref Rng & Units 01/23/2021 01/23/2021 01/22/2021  Glucose 70 - 99 mg/dL 239(H) - -  BUN 6 - 20 mg/dL 61(H) - -  Creatinine 0.44 - 1.00 mg/dL 0.95 - -  Sodium 135 - 145 mmol/L 140 - -  Potassium 3.5 - 5.1 mmol/L 5.6(H) 5.5(H) 5.6(H)  Chloride 98 - 111 mmol/L 109 - -  CO2 22 - 32 mmol/L 23 - -  Calcium 8.9 - 10.3 mg/dL 8.9 - -    BP (!) 141/62   Pulse 72   Temp 99.86 F (37.7 C)   Resp 11   Ht 5' 7"  (1.702 m)   Wt (!) 183.7 kg   SpO2 98%   BMI 63.43 kg/m    I/O last 3 completed shifts: In: 4255.1 [I.V.:2402.3; NG/GT:879.1; IV Piggyback:973.8] Out: 8208 [Urine:5100; Emesis/NG output:150] No intake/output data recorded.  SpO2: 98 % O2 Flow Rate (L/min): 45 L/min FiO2 (%): 40 %  Estimated body mass index is 63.43 kg/m as calculated from the following:   Height as of this encounter: 5' 7"  (1.702 m).   Weight as of this encounter: 183.7 kg.  SIGNIFICANT EVENTS   REVIEW OF SYSTEMS  PATIENT IS UNABLE TO PROVIDE COMPLETE REVIEW OF SYSTEMS DUE TO SEVERE CRITICAL ILLNESS  PHYSICAL EXAMINATION: GENERAL:critically ill appearing, +resp distress NECK: Supple.  PULMONARY: +rhonchi, +wheezing CARDIOVASCULAR: S1 and S2.  GASTROINTESTINAL: Soft, nontender, +Positive bowel sounds.  MUSCULOSKELETAL: No swelling, clubbing, or edema.  NEUROLOGIC: obtunded, GCS<8 SKIN:intact,warm,dry     MEDICATIONS: I have reviewed all medications and confirmed regimen as documented   CULTURE RESULTS   Recent Results (from the past 240 hour(s))  Blood Culture (routine x 2)     Status: None   Collection Time:  01/19/2021  2:56 PM   Specimen: BLOOD  Result Value Ref Range Status   Specimen Description BLOOD BLOOD LEFT ARM  Final   Special Requests   Final    BOTTLES DRAWN AEROBIC AND ANAEROBIC Blood Culture results may not be optimal due to an inadequate volume of blood received in culture bottles   Culture   Final    NO GROWTH 5 DAYS Performed at [facility], [address] [place], [place] [address]    Report Status 01/19/2021 FINAL  Final  Blood Culture (routine x 2)     Status: None   Collection Time: 01/16/2021  2:59 PM   Specimen: BLOOD  Result Value Ref Range Status   Specimen Description BLOOD BLOOD LEFT FOREARM  Final   Special Requests   Final    BOTTLES DRAWN AEROBIC AND ANAEROBIC Blood Culture adequate volume   Culture   Final    NO GROWTH 5 DAYS Performed at [facility], [address], [place], [place] [address]    Report Status 01/19/2021 FINAL  Final  Culture, respiratory     Status: None   Collection Time: 01/15/21  5:50 PM   Specimen: Tracheal Aspirate; Respiratory  Result Value Ref Range Status   Specimen Description   Final    TRACHEAL ASPIRATE Performed at [facility], [address] [place], [place] [address]    Special Requests   Final    NONE Performed at [facility], [address] [place], [place] [address]    Gram Stain   Final    FEW WBC PRESENT, PREDOMINANTLY PMN FEW GRAM NEGATIVE RODS FEW GRAM POSITIVE RODS RARE GRAM POSITIVE COCCI    Culture   Final    Normal respiratory flora-no Staph aureus or Pseudomonas seen Performed at [facility], [place] [address] [place], [place] [address]    Report Status 01/18/2021 FINAL  Final  Culture, respiratory (non-expectorated)     Status: None   Collection Time: 01/19/21  6:20 PM   Specimen: Tracheal Aspirate; Respiratory  Result Value Ref Range Status   Specimen Description   Final    TRACHEAL ASPIRATE Performed at [facility], [address], [place], [place] [address]    Special Requests   Final    NONE Performed at [facility], [address] [place], [place] [address]    Gram Stain   Final    ABUNDANT WBC PRESENT, PREDOMINANTLY PMN FEW GRAM POSITIVE COCCI RARE GRAM NEGATIVE RODS RARE GRAM POSITIVE RODS    Culture   Final    Normal respiratory flora-no Staph aureus or Pseudomonas seen Performed at [facility], [place] [address] [place], [place] [address]    Report Status 01/22/2021 FINAL  Final  CULTURE, BLOOD (ROUTINE X 2) w Reflex to ID Panel     Status: Abnormal (Preliminary result)   Collection Time: 01/19/21  6:45 PM   Specimen: BLOOD  Result Value Ref Range Status   Specimen Description  Final    BLOOD BLOOD LEFT HAND Performed at [facility], [address] [place], [place] [address]    Special Requests   Final    BOTTLES DRAWN AEROBIC AND ANAEROBIC Blood Culture adequate volume Performed at [facility], [address] [place], [place] [address]    Culture  Setup Time   Final    GRAM POSITIVE COCCI IN BOTH AEROBIC AND ANAEROBIC BOTTLES CRITICAL RESULT CALLED TO, READ BACK BY AND VERIFIED WITH: BRANDON BEERS AT 3524 ON 01/20/21 BY SS Performed at [facility], [place] [address] [place], [place] [address]    Culture STAPHYLOCOCCUS EPIDERMIDIS (A)  Final   Report Status PENDING  Incomplete  Blood Culture ID Panel (Reflexed)     Status: Abnormal   Collection Time: 01/19/21  6:45 PM  Result Value Ref Range Status   Enterococcus faecalis NOT DETECTED NOT DETECTED Final   Enterococcus Faecium NOT DETECTED NOT DETECTED Final   Listeria monocytogenes NOT DETECTED NOT DETECTED Final   Staphylococcus species DETECTED (A) NOT DETECTED Final    Comment: CRITICAL RESULT CALLED TO, READ BACK BY AND VERIFIED WITH: BRANDON BEERS AT 1539 ON 01/20/21 BY SS    Staphylococcus aureus (BCID) NOT DETECTED NOT DETECTED Final   Staphylococcus epidermidis DETECTED (A) NOT DETECTED  Final    Comment: Methicillin (oxacillin) resistant coagulase negative staphylococcus. Possible blood culture contaminant (unless isolated from more than one blood culture draw or clinical case suggests pathogenicity). No antibiotic treatment is indicated for blood  culture contaminants. CRITICAL RESULT CALLED TO, READ BACK BY AND VERIFIED WITH: BRANDON BEERS AT 0931 ON 01/20/21 BY SS    Staphylococcus lugdunensis NOT DETECTED NOT DETECTED Final   Streptococcus species NOT DETECTED NOT DETECTED Final   Streptococcus agalactiae NOT DETECTED NOT DETECTED Final   Streptococcus pneumoniae NOT DETECTED NOT DETECTED Final   Streptococcus pyogenes NOT DETECTED NOT DETECTED Final   A.calcoaceticus-baumannii NOT DETECTED NOT DETECTED Final   Bacteroides fragilis NOT DETECTED NOT DETECTED Final   Enterobacterales NOT DETECTED NOT DETECTED Final   Enterobacter cloacae complex NOT DETECTED NOT DETECTED Final   Escherichia coli NOT DETECTED NOT DETECTED Final   Klebsiella aerogenes NOT DETECTED NOT DETECTED Final   Klebsiella oxytoca NOT DETECTED NOT DETECTED Final   Klebsiella pneumoniae NOT DETECTED NOT DETECTED Final   Proteus species NOT DETECTED NOT DETECTED Final   Salmonella species NOT DETECTED NOT DETECTED Final   Serratia marcescens NOT DETECTED NOT DETECTED Final   Haemophilus influenzae NOT DETECTED NOT DETECTED Final   Neisseria meningitidis NOT DETECTED NOT DETECTED Final   Pseudomonas aeruginosa NOT DETECTED NOT DETECTED Final   Stenotrophomonas maltophilia NOT DETECTED NOT DETECTED Final   Candida albicans NOT DETECTED NOT DETECTED Final   Candida auris NOT DETECTED NOT DETECTED Final   Candida glabrata NOT DETECTED NOT DETECTED Final   Candida krusei NOT DETECTED NOT DETECTED Final   Candida parapsilosis NOT DETECTED NOT DETECTED Final   Candida tropicalis NOT DETECTED NOT DETECTED Final   Cryptococcus neoformans/gattii NOT DETECTED NOT DETECTED Final   Methicillin resistance  mecA/C DETECTED (A) NOT DETECTED Final    Comment: CRITICAL RESULT CALLED TO, READ BACK BY AND VERIFIED WITH: BRANDON BEERS AT 1539 ON 01/20/21 BY SS Performed at [facility], [address] [place],  [address]   CULTURE, BLOOD (ROUTINE X 2) w Reflex to ID Panel     Status: None (Preliminary result)   Collection Time: 01/19/21  6:47 PM  Specimen: BLOOD  Result Value Ref Range Status   Specimen Description BLOOD BLOOD RIGHT HAND  Final   Special Requests   Final    BOTTLES DRAWN AEROBIC AND ANAEROBIC Blood Culture adequate volume   Culture   Final    NO GROWTH 4 DAYS Performed at [facility], [address], [place], [place] [address]    Report Status PENDING  Incomplete  MRSA PCR Screening     Status: None   Collection Time: 01/20/21  7:58 AM   Specimen: Nasal Mucosa; Nasopharyngeal  Result Value Ref Range Status   MRSA by PCR NEGATIVE NEGATIVE Final    Comment:        The GeneXpert MRSA Assay (FDA approved for NASAL specimens only), is one component of a comprehensive MRSA colonization surveillance program. It is not intended to diagnose MRSA infection nor to guide or monitor treatment for MRSA infections. Performed at [facility], [address] [place], [place] [address]   CULTURE, BLOOD (ROUTINE X 2) w Reflex to ID Panel     Status: None (Preliminary result)   Collection Time: 01/22/21  1:02 PM   Specimen: BLOOD  Result Value Ref Range Status   Specimen Description BLOOD BLOOD LEFT HAND  Final   Special Requests   Final    BOTTLES DRAWN AEROBIC AND ANAEROBIC Blood Culture adequate volume   Culture   Final    NO GROWTH < 24 HOURS Performed at [facility], [address], [place], [place] [address]    Report Status PENDING  Incomplete  CULTURE, BLOOD (ROUTINE X 2) w Reflex to ID Panel     Status: None (Preliminary result)   Collection Time: 01/22/21  1:02 PM   Specimen: BLOOD  Result Value Ref Range Status    Specimen Description BLOOD BLOOD RIGHT HAND  Final   Special Requests   Final    BOTTLES DRAWN AEROBIC AND ANAEROBIC Blood Culture adequate volume   Culture   Final    NO GROWTH < 24 HOURS Performed at [facility], [address], [place], [place] [address]    Report Status PENDING  Incomplete          IMAGING    No results found.   Nutrition Status: Nutrition Problem: Inadequate oral intake Etiology: inability to eat (pt sedated and ventilated) Signs/Symptoms: NPO status       Indwelling Urinary Catheter continued, requirement due to   Reason to continue Indwelling Urinary Catheter strict Intake/Output monitoring for hemodynamic instability   Central Line/ continued, requirement due to  Reason to continue [place] of central venous pressure or other hemodynamic parameters and poor IV access   Ventilator continued, requirement due to severe respiratory failure   Ventilator Sedation RASS 0 to -2      ASSESSMENT AND PLAN SYNOPSIS     55 year old unvaccinated female admitted with acute hypoxic respiratory failure in the setting of COVID-19 pneumonia     Acute hypoxemic respiratory failure due to COVID-19 pneumonia/ARDS Mechanical ventilation via ARDS protocol, target PRVC 6 cc/kg Wean PEEP and FiO2 as able Goal plateau pressure less than 30, driving pressure less than 15 Paralytics if necessary for vent synchrony, gas exchange Deep sedation per PAD protocol Diuresis as tolerated based on Kidney function VAP prevention order set IV STEROIDS Therapy Follow inflammatory markers as needed with CRP Vitamin C, zinc Plan to repeat and check resp cultures as needed   Severe ACUTE Hypoxic and Hypercapnic Respiratory Failure -continue  Full MV support -continue Bronchodilator Therapy -Wean Fio2 and PEEP as tolerated -will perform SAT/SBT when respiratory parameters are met -VAP/VENT bundle implementation  ACUTE DIASTOLIC CARDIAC  FAILURE-  -oxygen as needed -Lasix as tolerated   Morbid obesity, possible OSA.   Will certainly impact respiratory mechanics, ventilator weaning Suspect will need to consider additional PEEP   ACUTE KIDNEY INJURY/Renal Failure -continue Foley Catheter-assess need -Avoid nephrotoxic agents -Follow urine output, BMP -Ensure adequate renal perfusion, optimize oxygenation -Renal dose medications Hyperkalemia Plan for Nephrology consultation Obtain PICC line   NEUROLOGY Acute toxic metabolic encephalopathy, need for sedation Goal RASS -2 to -3 Change to Propofol and FENT   CARDIAC ICU monitoring  INFECTIOUS DISEASE -continue antibiotics as prescribed -follow up cultures -follow up ID consultation     GI GI PROPHYLAXIS as indicated +BM  DIET-->TF's as tolerated Constipation protocol as indicated  ENDO - will use ICU hypoglycemic\Hyperglycemia protocol if indicated    ELECTROLYTES -follow labs as needed -replace as needed -pharmacy consultation and following   DVT/GI PRX ordered and assessed TRANSFUSIONS AS NEEDED MONITOR FSBS I Assessed the need for Labs I Assessed the need for Foley I Assessed the need for Central Venous Line Family Discussion when available I Assessed the need for Mobilization I made an Assessment of medications to be adjusted accordingly Safety Risk assessment completed   CASE DISCUSSED IN MULTIDISCIPLINARY ROUNDS WITH ICU TEAM  Critical Care Time devoted to patient care services described in this note is 54 minutes.   Overall, patient is critically ill, prognosis is guarded.  Patient with Multiorgan failure and at high risk for cardiac arrest and death.    Corrin Parker, M.D.  [facility]  Medical Director Tat Momoli Director [facility] Cardio-Pulmonary Department

## 2021-01-23 NOTE — Consult Note (Signed)
[facility]  CONSULT NOTE    Date: 01/23/2021                  Patient Name:  Dana Bruce  MRN: [id number]  DOB: 20-Apr-1966  Age / Sex: 55 y.o., female         PCP: Maury Dus, MD                 Service Requesting Consult: Dr. Mortimer Fries                 Reason for Consult: Hyperkalemia            History of Present Illness: Ms. TRISTINA SAHAGIAN was admitted for acute respiratory failure secondary to COVID-19 pneumonia. Patient was intubated and did have an initial increase in creatinine but this has improved. However hyperkalemia has been persistent so nephrology was called. Seems patient has been given lokelma   Medications: Outpatient medications: Medications Prior to Admission  Medication Sig Dispense Refill Last Dose  . acetaminophen (TYLENOL) 650 MG CR tablet Take 650 mg by mouth every 8 (eight) hours as needed for pain.   Unknown at PRN  . amLODipine (NORVASC) 10 MG tablet Take 10 mg by mouth daily.    48+ hours at Unknown  . ascorbic acid (VITAMIN C) 500 MG tablet Take 500 mg by mouth daily.     Marland Kitchen aspirin 81 MG tablet Take 81 mg by mouth daily.     Marland Kitchen azelastine (OPTIVAR) 0.05 % ophthalmic solution Apply 1 drop to eye 2 (two) times daily.   48+ hours at Unknown  . cloNIDine (CATAPRES) 0.1 MG tablet Take 0.1 mg by mouth 2 (two) times daily.   48+ hours at Unknown  . fenofibrate (TRICOR) 145 MG tablet Take 145 mg by mouth daily.   48+ hours at Unknown  . fluticasone (FLONASE) 50 MCG/ACT nasal spray Place 2 sprays into both nostrils daily.   48+ hours at Unknown  . Glucosamine 500 MG CAPS Take 1,000 mg by mouth daily.     . hyoscyamine (LEVSIN SL) 0.125 MG SL tablet Take 0.125-0.25 mg by mouth every 4 (four) hours as needed for cramping.   Unknown at PRN  . metFORMIN (GLUCOPHAGE) 1000 MG tablet Take 1,000 mg by mouth 2 (two) times daily.    48+ hours at Unknown  . metoprolol (LOPRESSOR) 100 MG tablet Take 100 mg by mouth 2 (two) times daily.   48+ hours  at Unknown  . oxybutynin (DITROPAN) 5 MG tablet Take 5 mg by mouth every 8 (eight) hours as needed for incontinence.   Unknown at PRN  . pioglitazone (ACTOS) 30 MG tablet Take 30 mg by mouth daily.    48+ hours at Unknown  . promethazine (PHENERGAN) 25 MG tablet Take 25 mg by mouth every 6 (six) hours as needed for nausea or vomiting.   Unknown at PRN  . simvastatin (ZOCOR) 20 MG tablet Take 20 mg by mouth at bedtime.    48+ hours at Unknown  . telmisartan-hydrochlorothiazide (MICARDIS HCT) 80-25 MG tablet Take 1 tablet by mouth daily.   48+ hours at Unknown  . traMADol (ULTRAM) 50 MG tablet Take 100 mg by mouth every 12 (twelve) hours as needed for pain.   Unknown at PRN  . TRULICITY 1.5 WE/9.9BZ SOPN Inject 1.5 mg into the skin once a week.     . Vitamin D, Cholecalciferol, 25 MCG (1000 UT) TABS Take 3,000 Units by mouth daily.  Current medications: Current Facility-Administered Medications  Medication Dose Route Frequency Provider Last Rate Last Admin  . 0.9 %  sodium chloride infusion  250 mL Intravenous PRN Flora Lipps, MD 5 mL/hr at 01/23/21 1347 250 mL at 01/23/21 1347  . acetaminophen (TYLENOL) 160 MG/5ML solution 650 mg  650 mg Per Tube Q4H PRN Flora Lipps, MD   650 mg at 01/23/21 0955  . albuterol (VENTOLIN HFA) 108 (90 Base) MCG/ACT inhaler 2 puff  2 puff Inhalation Q2H PRN Vanessa [place], MD   2 puff at 02/03/2021 1554  . amLODipine (NORVASC) tablet 10 mg  10 mg Per Tube Daily Rust-Chester, Britton L, NP   10 mg at 01/23/21 0956  . ascorbic acid (VITAMIN C) tablet 1,000 mg  1,000 mg Per Tube TID Flora Lipps, MD   1,000 mg at 01/23/21 1641  . ceFEPIme (MAXIPIME) 2 g in sodium chloride 0.9 % 100 mL IVPB  2 g Intravenous Q8H Kasa, Kurian, MD 200 mL/hr at 01/23/21 1350 2 g at 01/23/21 1350  . chlorhexidine gluconate (MEDLINE KIT) (PERIDEX) 0.12 % solution 15 mL  15 mL Mouth Rinse BID Ottie Glazier, MD   15 mL at 01/23/21 0814  . Chlorhexidine Gluconate Cloth 2 % PADS 6 each  6  each Topical Daily Flora Lipps, MD   6 each at 01/23/21 1009  . enoxaparin (LOVENOX) injection 95 mg  0.5 mg/kg Subcutaneous Q24H Flora Lipps, MD   95 mg at 01/22/21 1958  . famotidine (PEPCID) IVPB 20 mg premix  20 mg Intravenous Q12H Flora Lipps, MD   Stopped at 01/23/21 1028  . feeding supplement (NEPRO CARB STEADY) liquid 1,000 mL  1,000 mL Per Tube Continuous Flora Lipps, MD 55 mL/hr at 01/23/21 1339 1,000 mL at 01/23/21 1339  . feeding supplement (PROSource TF) liquid 45 mL  45 mL Per Tube QID Flora Lipps, MD   45 mL at 01/23/21 1344  . fentaNYL 2575mg in NS 256m(1061mml) infusion-PREMIX  0-400 mcg/hr Intravenous Continuous GruDallie PilesPH 25 mL/hr at 01/23/21 1300 250 mcg/hr at 01/23/21 1300  . free water 200 mL  200 mL Per Tube Q4H GonTyler PitaD   200 mL at 01/23/21 1656  . guaiFENesin-dextromethorphan (ROBITUSSIN DM) 100-10 MG/5ML syrup 5 mL  5 mL Oral Q4H PRN FunVanessa DurhamD   5 mL at 01/31/2021 1607  . insulin aspart (novoLOG) injection 0-20 Units  0-20 Units Subcutaneous Q4H KeeDarel Hong NP   7 Units at 01/23/21 1640  . insulin aspart (novoLOG) injection 5 Units  5 Units Subcutaneous Q4H KasFlora LippsD   5 Units at 01/23/21 1640  . insulin detemir (LEVEMIR) injection 20 Units  20 Units Subcutaneous Daily KasFlora LippsD   20 Units at 01/23/21 0956  . labetalol (NORMODYNE) injection 10 mg  10 mg Intravenous Q2H PRN GonTyler PitaD   10 mg at 01/22/21 1313  . lactulose (CHRONULAC) 10 GM/15ML solution 10 g  10 g Per Tube BID KasFlora LippsD   10 g at 01/21/21 2156  . MEDLINE mouth rinse  15 mL Mouth Rinse 10 times per day AleOttie GlazierD   15 mL at 01/23/21 1656  . methylPREDNISolone sodium succinate (SOLU-MEDROL) 40 mg/mL injection 20 mg  20 mg Intravenous Q12H KasFlora LippsD   20 mg at 01/23/21 0956  . metoprolol tartrate (LOPRESSOR) tablet 100 mg  100 mg Per Tube BID Rust-Chester, Britton L, NP   100 mg at 01/23/21  7169  . midazolam (VERSED)  injection 2 mg  2 mg Intravenous Q15 min PRN Rust-Chester, Britton L, NP   2 mg at 01/22/21 1401  . midazolam (VERSED) injection 2 mg  2 mg Intravenous Q2H PRN Rust-Chester, Britton L, NP      . ondansetron (ZOFRAN) injection 4 mg  4 mg Intravenous Q6H PRN Mortimer Fries, Kurian, MD      . polyethylene glycol (MIRALAX / GLYCOLAX) packet 17 g  17 g Per Tube Daily Rust-Chester, Britton L, NP   17 g at 01/21/21 0930  . propofol (DIPRIVAN) 1000 MG/100ML infusion  5-80 mcg/kg/min Intravenous Titrated Flora Lipps, MD 49.6 mL/hr at 01/23/21 1638 45 mcg/kg/min at 01/23/21 1638  . senna-docusate (Senokot-S) tablet 2 tablet  2 tablet Per Tube BID Tyler Pita, MD   2 tablet at 01/21/21 2155  . sodium chloride flush (NS) 0.9 % injection 10-40 mL  10-40 mL Intracatheter Q12H Ottie Glazier, MD   10 mL at 01/23/21 1010  . sodium chloride flush (NS) 0.9 % injection 10-40 mL  10-40 mL Intracatheter PRN Ottie Glazier, MD      . sodium chloride flush (NS) 0.9 % injection 10-40 mL  10-40 mL Intracatheter Q12H Flora Lipps, MD   10 mL at 01/23/21 1353  . sodium chloride flush (NS) 0.9 % injection 10-40 mL  10-40 mL Intracatheter PRN Flora Lipps, MD      . sodium chloride flush (NS) 0.9 % injection 3 mL  3 mL Intravenous Q12H Flora Lipps, MD   3 mL at 01/22/21 2112  . sodium chloride flush (NS) 0.9 % injection 3 mL  3 mL Intravenous PRN Flora Lipps, MD   3 mL at 01/16/21 2159  . sodium zirconium cyclosilicate (LOKELMA) packet 10 g  10 g Per Tube BID Emireth Cockerham, MD   10 g at 01/23/21 1131  . vancomycin (VANCOREADY) IVPB 1250 mg/250 mL  1,250 mg Intravenous Q12H Flora Lipps, MD          Allergies: Allergies  Allergen Reactions  . Lasix [Furosemide] Itching  . Penicillins Hives and Itching    Has patient had a PCN reaction causing immediate rash, facial/tongue/throat swelling, SOB or lightheadedness with hypotension: yes Has patient had a PCN reaction causing severe rash involving mucus membranes or skin  necrosis: no Has patient had a PCN reaction that required hospitalization No Has patient had a PCN reaction occurring within the last 10 years: No If all of the above answers are "NO", then may proceed with Cephalosporin use.   [place] Flavor [Flavoring Agent] Itching, Nausea And Vomiting and Rash    Breaks out in places where she touches strawberries       Past Medical History: Past Medical History:  Diagnosis Date  . Arthritis    hips  . Diabetes mellitus without complication ([place])    type 2  . Family history of adverse reaction to anesthesia    mother has ponv  . Goiter    followed by dr gerkin q year  . Hypertension   . IBS (irritable bowel syndrome)   . PONV (postoperative nausea and vomiting)    ponv 1 day after last dental surgery     Past Surgical History: Past Surgical History:  Procedure Laterality Date  . ABDOMINAL HYSTERECTOMY     partial  . CHOLECYSTECTOMY    . COLONOSCOPY WITH PROPOFOL N/A 05/17/2017   Procedure: COLONOSCOPY WITH PROPOFOL;  Surgeon: Arta Silence, MD;  Location: [facility];  Service: Endoscopy;  Laterality: N/A;  . colonscopy  1995 or 1996   with barium enema  . DENTAL SURGERY    . DILATION AND CURETTAGE OF UTERUS     x 2  . surgery for ecotic pregnancy       Family History: No family history on file.   Social History: Social History   Socioeconomic History  . Marital status: Married    Spouse name: Not on file  . Number of children: Not on file  . Years of education: Not on file  . Highest education level: Not on file  Occupational History  . Not on file  Tobacco Use  . Smoking status: Never Smoker  . Smokeless tobacco: Never Used  Vaping Use  . Vaping Use: Never used  Substance and Sexual Activity  . Alcohol use: No  . Drug use: No  . Sexual activity: Not on file  Other Topics Concern  . Not on file  Social History Narrative  . Not on file   Social Determinants of Health   Financial Resource Strain:  Not on file  Food Insecurity: Not on file  Transportation Needs: Not on file  Physical Activity: Not on file  Stress: Not on file  Social Connections: Not on file  Intimate Partner Violence: Not on file     Review of Systems: Review of Systems  Unable to perform ROS: Critical illness    Vital Signs: Blood pressure (!) 161/81, pulse 74, temperature 98.96 F (37.2 C), resp. rate 15, height 5' 7"  (1.702 m), weight (!) 183.7 kg, SpO2 94 %.  Weight trends: Filed Weights   01/20/2021 1508 01/15/21 0420 01/22/21 0445  Weight: (!) 187.8 kg (!) 179.6 kg (!) 183.7 kg    Physical Exam: General: Critically ill  Head: ETT  Eyes: Anicteric, PERRL  Neck: Supple, trachea midline  Lungs:  PRVC FiO2 40%  Heart: Regular rate and rhythm  Abdomen:  Soft, nontender  Extremities: no peripheral edema.  Neurologic: Nonfocal, moving all four extremities  Skin: No lesions  Access: none     Lab results: Basic Metabolic Panel: Recent Labs  Lab 01/17/21 0429 01/18/21 0458 01/19/21 0732 01/21/21 0425 01/21/21 1500 01/21/21 1910 01/22/21 0316 01/22/21 1029 01/22/21 1652 01/23/21 0046 01/23/21 1208  NA 140 139   < > 139 139  --  142  --   --  140  --   K 5.2* 4.5   < > 5.7* 6.0*  6.1*   < > 5.8*   < > 5.6* 5.6*  5.5* 4.4  CL 100 103   < > 106 108  --  109  --   --  109  --   CO2 29 27   < > 24 21*  --  23  --   --  23  --   GLUCOSE 229* 283*   < > 352* 307*  --  258*  --   --  239*  --   BUN 42* 60*   < > 76* 72*  --  67*  --   --  61*  --   CREATININE 1.85* 2.05*   < > 1.55* 1.21*  --  1.16*  --   --  0.95  --   CALCIUM 8.6* 8.0*   < > 8.3* 8.6*  --  8.9  --   --  8.9  --   MG 1.8 1.7  --  2.4  --   --  2.4  --   --  2.0  --   PHOS 4.5 3.4  --  4.8*  --   --  2.7  --   --  3.7  --    < > = values in this interval not displayed.    Liver Function Tests: Recent Labs  Lab 01/17/21 0429 01/18/21 0458 01/19/21 0732 01/21/21 0425 01/22/21 0316 01/23/21 0046  AST 24 18 14*  --    --   --   ALT 17 16 14   --   --   --   ALKPHOS 52 43 44  --   --   --   BILITOT 0.8 1.0 0.9  --   --   --   PROT 6.1* 5.7* 5.9*  --   --   --   ALBUMIN 2.1* 2.2* 2.1* 2.1* 2.2* 2.3*   No results for input(s): LIPASE, AMYLASE in the last 168 hours. No results for input(s): AMMONIA in the last 168 hours.  CBC: Recent Labs  Lab 01/18/21 0458 01/19/21 0732 01/21/21 0425 01/22/21 0316 01/23/21 0046  WBC 9.9 9.3 9.9 8.7 9.6  NEUTROABS  --   --  7.0 5.9 6.9  HGB 10.5* 10.9* 10.9* 11.4* 10.6*  HCT 34.3* 34.7* 36.2 37.5 35.5*  MCV 87.3 85.9 86.8 86.6 86.2  PLT 290 303 263 286 269    Cardiac Enzymes: No results for input(s): CKTOTAL, CKMB, CKMBINDEX, TROPONINI in the last 168 hours.  BNP: Invalid input(s): POCBNP  CBG: Recent Labs  Lab 01/22/21 2329 01/23/21 0309 01/23/21 0751 01/23/21 1102 01/23/21 1540  GLUCAP 196* 197* 191* 181* 64*    Microbiology: Results for orders placed or performed during the hospital encounter of 02/05/2021  Blood Culture (routine x 2)     Status: None   Collection Time: 01/20/2021  2:56 PM   Specimen: BLOOD  Result Value Ref Range Status   Specimen Description BLOOD BLOOD LEFT ARM  Final   Special Requests   Final    BOTTLES DRAWN AEROBIC AND ANAEROBIC Blood Culture results may not be optimal due to an inadequate volume of blood received in culture bottles   Culture   Final    NO GROWTH 5 DAYS Performed at [facility], [address], [place], [place] [address]    Report Status 01/19/2021 FINAL  Final  Blood Culture (routine x 2)     Status: None   Collection Time: 01/15/2021  2:59 PM   Specimen: BLOOD  Result Value Ref Range Status   Specimen Description BLOOD BLOOD LEFT FOREARM  Final   Special Requests   Final    BOTTLES DRAWN AEROBIC AND ANAEROBIC Blood Culture adequate volume   Culture   Final    NO GROWTH 5 DAYS Performed at [facility], [address] [place], [place] [address]    Report Status 01/19/2021  FINAL  Final  Culture, respiratory     Status: None   Collection Time: 01/15/21  5:50 PM   Specimen: Tracheal Aspirate; Respiratory  Result Value Ref Range Status   Specimen Description   Final    TRACHEAL ASPIRATE Performed at [facility], [address] [place], [place] [address]    Special Requests   Final    NONE Performed at [facility], [address] [place], [place] [address]    Gram Stain   Final    FEW WBC PRESENT, PREDOMINANTLY PMN FEW GRAM NEGATIVE RODS FEW GRAM POSITIVE RODS RARE GRAM POSITIVE COCCI    Culture  Final    Normal respiratory flora-no Staph aureus or Pseudomonas seen Performed at [facility] [address] [place], [place] [address]    Report Status 01/18/2021 FINAL  Final  Culture, respiratory (non-expectorated)     Status: None   Collection Time: 01/19/21  6:20 PM   Specimen: Tracheal Aspirate; Respiratory  Result Value Ref Range Status   Specimen Description   Final    TRACHEAL ASPIRATE Performed at [facility], [address], [place], [place] [address]    Special Requests   Final    NONE Performed at [facility] [address] [place], [place] [address]    Gram Stain   Final    ABUNDANT WBC PRESENT, PREDOMINANTLY PMN FEW GRAM POSITIVE COCCI RARE GRAM NEGATIVE RODS RARE GRAM POSITIVE RODS    Culture   Final    Normal respiratory flora-no Staph aureus or Pseudomonas seen Performed at [facility], [place] [address], [place], [place] [address]    Report Status 01/22/2021 FINAL  Final  CULTURE, BLOOD (ROUTINE X 2) w Reflex to ID Panel     Status: Abnormal   Collection Time: 01/19/21  6:45 PM   Specimen: BLOOD  Result Value Ref Range Status   Specimen Description   Final    BLOOD BLOOD LEFT HAND Performed at [facility], [address] [place], [place] [address]    Special Requests   Final    BOTTLES DRAWN AEROBIC AND ANAEROBIC Blood Culture adequate  volume Performed at [facility], [address] [place], [place] [address]    Culture  Setup Time   Final    GRAM POSITIVE COCCI IN BOTH AEROBIC AND ANAEROBIC BOTTLES CRITICAL RESULT CALLED TO, READ BACK BY AND VERIFIED WITH: BRANDON BEERS AT 8338 ON 01/20/21 BY SS    Culture (A)  Final    STAPHYLOCOCCUS EPIDERMIDIS THE SIGNIFICANCE OF ISOLATING THIS ORGANISM FROM A SINGLE SET OF BLOOD CULTURES WHEN MULTIPLE SETS ARE DRAWN IS UNCERTAIN. PLEASE NOTIFY THE MICROBIOLOGY DEPARTMENT WITHIN ONE WEEK IF SPECIATION AND SENSITIVITIES ARE REQUIRED. Performed at [facility], [place] [address] [place], [place] [address]    Report Status 01/23/2021 FINAL  Final  Blood Culture ID Panel (Reflexed)     Status: Abnormal   Collection Time: 01/19/21  6:45 PM  Result Value Ref Range Status   Enterococcus faecalis NOT DETECTED NOT DETECTED Final   Enterococcus Faecium NOT DETECTED NOT DETECTED Final   Listeria monocytogenes NOT DETECTED NOT DETECTED Final   Staphylococcus species DETECTED (A) NOT DETECTED Final    Comment: CRITICAL RESULT CALLED TO, READ BACK BY AND VERIFIED WITH: BRANDON BEERS AT 1539 ON 01/20/21 BY SS    Staphylococcus aureus (BCID) NOT DETECTED NOT DETECTED Final   Staphylococcus epidermidis DETECTED (A) NOT DETECTED Final    Comment: Methicillin (oxacillin) resistant coagulase negative staphylococcus. Possible blood culture contaminant (unless isolated from more than one blood culture draw or clinical case suggests pathogenicity). No antibiotic treatment is indicated for blood  culture contaminants. CRITICAL RESULT CALLED TO, READ BACK BY AND VERIFIED WITH: BRANDON BEERS AT 9767 ON 01/20/21 BY SS    Staphylococcus lugdunensis NOT DETECTED NOT DETECTED Final   Streptococcus species NOT DETECTED NOT DETECTED Final   Streptococcus agalactiae NOT DETECTED NOT DETECTED Final   Streptococcus pneumoniae NOT DETECTED NOT DETECTED Final   Streptococcus pyogenes NOT DETECTED NOT  DETECTED Final   A.calcoaceticus-baumannii NOT DETECTED NOT DETECTED Final   Bacteroides  fragilis NOT DETECTED NOT DETECTED Final   Enterobacterales NOT DETECTED NOT DETECTED Final   Enterobacter cloacae complex NOT DETECTED NOT DETECTED Final   Escherichia coli NOT DETECTED NOT DETECTED Final   Klebsiella aerogenes NOT DETECTED NOT DETECTED Final   Klebsiella oxytoca NOT DETECTED NOT DETECTED Final   Klebsiella pneumoniae NOT DETECTED NOT DETECTED Final   Proteus species NOT DETECTED NOT DETECTED Final   Salmonella species NOT DETECTED NOT DETECTED Final   Serratia marcescens NOT DETECTED NOT DETECTED Final   Haemophilus influenzae NOT DETECTED NOT DETECTED Final   Neisseria meningitidis NOT DETECTED NOT DETECTED Final   Pseudomonas aeruginosa NOT DETECTED NOT DETECTED Final   Stenotrophomonas maltophilia NOT DETECTED NOT DETECTED Final   Candida albicans NOT DETECTED NOT DETECTED Final   Candida auris NOT DETECTED NOT DETECTED Final   Candida glabrata NOT DETECTED NOT DETECTED Final   Candida krusei NOT DETECTED NOT DETECTED Final   Candida parapsilosis NOT DETECTED NOT DETECTED Final   Candida tropicalis NOT DETECTED NOT DETECTED Final   Cryptococcus neoformans/gattii NOT DETECTED NOT DETECTED Final   Methicillin resistance mecA/C DETECTED (A) NOT DETECTED Final    Comment: CRITICAL RESULT CALLED TO, READ BACK BY AND VERIFIED WITH: BRANDON BEERS AT 1539 ON 01/20/21 BY SS Performed at [facility], [address] [place], [place] [address]   CULTURE, BLOOD (ROUTINE X 2) w Reflex to ID Panel     Status: None (Preliminary result)   Collection Time: 01/19/21  6:47 PM   Specimen: BLOOD  Result Value Ref Range Status   Specimen Description BLOOD BLOOD RIGHT HAND  Final   Special Requests   Final    BOTTLES DRAWN AEROBIC AND ANAEROBIC Blood Culture adequate volume   Culture   Final    NO GROWTH 4 DAYS Performed at [facility], [address] [place], [place]  [address]    Report Status PENDING  Incomplete  MRSA PCR Screening     Status: None   Collection Time: 01/20/21  7:58 AM   Specimen: Nasal Mucosa; Nasopharyngeal  Result Value Ref Range Status   MRSA by PCR NEGATIVE NEGATIVE Final    Comment:        The GeneXpert MRSA Assay (FDA approved for NASAL specimens only), is one component of a comprehensive MRSA colonization surveillance program. It is not intended to diagnose MRSA infection nor to guide or monitor treatment for MRSA infections. Performed at [facility], [address] [place], [place] [address]   CULTURE, BLOOD (ROUTINE X 2) w Reflex to ID Panel     Status: None (Preliminary result)   Collection Time: 01/22/21  1:02 PM   Specimen: BLOOD  Result Value Ref Range Status   Specimen Description BLOOD BLOOD LEFT HAND  Final   Special Requests   Final    BOTTLES DRAWN AEROBIC AND ANAEROBIC Blood Culture adequate volume   Culture   Final    NO GROWTH < 24 HOURS Performed at [facility], [address] [place], [place] [address]    Report Status PENDING  Incomplete  CULTURE, BLOOD (ROUTINE X 2) w Reflex to ID Panel     Status: None (Preliminary result)   Collection Time: 01/22/21  1:02 PM   Specimen: BLOOD  Result Value Ref Range Status   Specimen Description BLOOD BLOOD RIGHT HAND  Final   Special Requests   Final    BOTTLES DRAWN AEROBIC AND ANAEROBIC Blood Culture adequate volume   Culture  Final    NO GROWTH < 24 HOURS Performed at [facility], [address] [place], [place] [address]    Report Status PENDING  Incomplete    Coagulation Studies: No results for input(s): LABPROT, INR in the last 72 hours.  Urinalysis: No results for input(s): COLORURINE, LABSPEC, PHURINE, GLUCOSEU, HGBUR, BILIRUBINUR, KETONESUR, PROTEINUR, UROBILINOGEN, NITRITE, LEUKOCYTESUR in the last 72 hours.  Invalid input(s): APPERANCEUR    Imaging: [place] EKG SITE RITE  Result Date: 01/23/2021 If Site  Rite image not attached, placement could not be confirmed due to current cardiac rhythm.     Assessment & Plan: Ms. YASAMIN KAREL is a 55 y.o. white female with hypertension, IBS, diabetes mellitus type II, who was admitted to [facility] on 02/07/2021 for Acute respiratory failure with hypoxia ([place]) [J96.01] Pneumonia due to COVID-19 virus [U07.1, J12.82] COVID-19 [U07.1]  Patient remains intubated and requiring mechanical ventilation. Nephrology consulted for persistent hyperkalemia  1. Hyperkalemia: with normal kidney function.  - Current regimen of lokelma and given SPS earlier today.  - suspect persistent hyperkalemia secondary to constipation and earlier acute kidney injury - Continue bowel prep - IV bumex x 1 today. Evaluate daily for diuretics.  - Hold home medication of telmisartan.   2. Acute kidney injury: resolved. Nonoliguric urine output.   3. Hypertension - aspirin  4. 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 On metformin. Hemoglobin A1c at goal at 6.3%.       LOS: 9 Damiya Sandefur 2/12/20225:23 PM

## 2021-01-24 DIAGNOSIS — U071 COVID-19: Secondary | ICD-10-CM | POA: Diagnosis not present

## 2021-01-24 DIAGNOSIS — J80 Acute respiratory distress syndrome: Secondary | ICD-10-CM | POA: Diagnosis not present

## 2021-01-24 LAB — CULTURE, BLOOD (ROUTINE X 2)
Culture: NO GROWTH
Special Requests: ADEQUATE

## 2021-01-24 LAB — MAGNESIUM: Magnesium: 2.2 mg/dL (ref 1.7–2.4)

## 2021-01-24 LAB — CBC WITH DIFFERENTIAL/PLATELET
Abs Immature Granulocytes: 0.82 10*3/uL — ABNORMAL HIGH (ref 0.00–0.07)
Basophils Absolute: 0.1 10*3/uL (ref 0.0–0.1)
Basophils Relative: 1 %
Eosinophils Absolute: 0.1 10*3/uL (ref 0.0–0.5)
Eosinophils Relative: 1 %
HCT: 35.6 % — ABNORMAL LOW (ref 36.0–46.0)
Hemoglobin: 10.7 g/dL — ABNORMAL LOW (ref 12.0–15.0)
Immature Granulocytes: 7 %
Lymphocytes Relative: 8 %
Lymphs Abs: 1 10*3/uL (ref 0.7–4.0)
MCH: 26.9 pg (ref 26.0–34.0)
MCHC: 30.1 g/dL (ref 30.0–36.0)
MCV: 89.4 fL (ref 80.0–100.0)
Monocytes Absolute: 0.7 10*3/uL (ref 0.1–1.0)
Monocytes Relative: 6 %
Neutro Abs: 9.1 10*3/uL — ABNORMAL HIGH (ref 1.7–7.7)
Neutrophils Relative %: 77 %
Platelets: 321 10*3/uL (ref 150–400)
RBC: 3.98 MIL/uL (ref 3.87–5.11)
RDW: 15 % (ref 11.5–15.5)
Smear Review: NORMAL
WBC: 11.7 10*3/uL — ABNORMAL HIGH (ref 4.0–10.5)
nRBC: 0 % (ref 0.0–0.2)

## 2021-01-24 LAB — RENAL FUNCTION PANEL
Albumin: 2.3 g/dL — ABNORMAL LOW (ref 3.5–5.0)
Anion gap: 9 (ref 5–15)
BUN: 74 mg/dL — ABNORMAL HIGH (ref 6–20)
CO2: 25 mmol/L (ref 22–32)
Calcium: 9 mg/dL (ref 8.9–10.3)
Chloride: 107 mmol/L (ref 98–111)
Creatinine, Ser: 1.33 mg/dL — ABNORMAL HIGH (ref 0.44–1.00)
GFR, Estimated: 47 mL/min — ABNORMAL LOW (ref >60)
Glucose, Bld: 283 mg/dL — ABNORMAL HIGH (ref 70–99)
Phosphorus: 7.3 mg/dL — ABNORMAL HIGH (ref 2.5–4.6)
Potassium: 5.2 mmol/L — ABNORMAL HIGH (ref 3.5–5.1)
Sodium: 141 mmol/L (ref 135–145)

## 2021-01-24 LAB — GLUCOSE, CAPILLARY
Glucose-Capillary: 260 mg/dL — ABNORMAL HIGH (ref 70–99)
Glucose-Capillary: 220 mg/dL — ABNORMAL HIGH (ref 70–99)
Glucose-Capillary: 136 mg/dL — ABNORMAL HIGH (ref 70–99)
Glucose-Capillary: 222 mg/dL — ABNORMAL HIGH (ref 70–99)
Glucose-Capillary: 191 mg/dL — ABNORMAL HIGH (ref 70–99)
Glucose-Capillary: 148 mg/dL — ABNORMAL HIGH (ref 70–99)

## 2021-01-24 LAB — FERRITIN: Ferritin: 178 ng/mL (ref 11–307)

## 2021-01-24 LAB — FIBRIN DERIVATIVES D-DIMER (ARMC ONLY): Fibrin derivatives D-dimer (ARMC): 808.76 ng/mL (FEU) — ABNORMAL HIGH (ref 0.00–499.00)

## 2021-01-24 LAB — C-REACTIVE PROTEIN: CRP: 2.6 mg/dL — ABNORMAL HIGH (ref <1.0)

## 2021-01-24 LAB — TRIGLYCERIDES: Triglycerides: 829 mg/dL — ABNORMAL HIGH (ref <150)

## 2021-01-24 MED ORDER — MIDAZOLAM 50MG/50ML (1MG/ML) PREMIX INFUSION
0.5000 mg/h | INTRAVENOUS | Status: DC
  Administered 2021-01-24: 4 mg/h via INTRAVENOUS
  Administered 2021-01-24: 3 mg/h via INTRAVENOUS
  Administered 2021-01-25 – 2021-01-26 (×3): 4 mg/h via INTRAVENOUS
  Filled 2021-01-24 (×5): qty 50

## 2021-01-24 MED ORDER — SODIUM ZIRCONIUM CYCLOSILICATE 10 G PO PACK
10.0000 g | PACK | Freq: Every day | ORAL | Status: DC
  Administered 2021-01-24 – 2021-01-25 (×2): 10 g via ORAL
  Filled 2021-01-24: qty 1
  Filled 2021-01-24: qty 2

## 2021-01-24 MED ORDER — VANCOMYCIN HCL 2000 MG/400ML IV SOLN
2000.0000 mg | INTRAVENOUS | Status: DC
  Filled 2021-01-24: qty 400

## 2021-01-24 MED ORDER — BUMETANIDE 0.25 MG/ML IJ SOLN
0.5000 mg | Freq: Once | INTRAMUSCULAR | Status: AC
  Administered 2021-01-24: 0.5 mg via INTRAVENOUS
  Filled 2021-01-24: qty 4

## 2021-01-24 MED ORDER — INSULIN DETEMIR 100 UNIT/ML ~~LOC~~ SOLN
22.0000 [IU] | Freq: Every day | SUBCUTANEOUS | Status: DC
  Administered 2021-01-24 – 2021-02-01 (×9): 22 [IU] via SUBCUTANEOUS
  Filled 2021-01-24 (×10): qty 0.22

## 2021-01-24 NOTE — Progress Notes (Signed)
CRITICAL CARE NOTE 55 year old unvaccinated female admitted with acute hypoxic respiratory failure in the setting of COVID-19 pneumonia   Subjective Findings:  01/15/2021-patient with worsening resp distress s/p ETT. Has been started on lasix. Due to BMI >62 will hold proning. Patient with resp failure s/p ETT - I have updated husband Vonna Kotyk via phone today. 01/16/2021-patient was awake and had attempted to self extubate, sedation was increased. Midline was placed due to inadequate access no need for central line at this time. AM labs with worsening GFR, hyperglycemia. Vitals stable. UOP adequate appx 1L overnight, CXR with bilateral multifocal infiltrates. Ventilator management performed.  01/17/2021-patient has been weaned on MV from FIO2-100% to 70%, repeat ABG this afternoon. Called husband today (POA Zelene Barga) reviewed care plan and answered questions.  01/19/2021-appears volume overloaded, still with increased work of breathing when sedation is lightened 2/9FAILURE TO WEAN FROM VENT, SEVERE ALI 2/10 severe resp failure, failure to wean from vent 2/11 failed weaning trials +hyperkalemia 2/12 severe resp failure, failed weaning trials, PICC line placed  Significant Diagnostic Tests:  2/3: Chest x-ray>>IMPRESSION: Diffuse bilateral ground-glass airspace opacities consistent with the patient's history of viral pneumonia. 2/8:Chest x-ray>>improved aeration from prior  Micro Data:  1/30: SARS-CoV-2 PCR>> positive 2/3: Blood culture x2>> 2/3: Sputum>> 2/3: Strep pneumo urinary antigen>>NEG 2/3: Legionella urinary antigen>>NEG 2/8 [facility] EPI started vancomycin Antimicrobials:  Remdesivir 2/3>>2/8 Levaquin 2/3>>2/9 Cefepime and Vanc 2/9>>       CC  follow up respiratory failure  SUBJECTIVE Patient remains critically ill Prognosis is guarded  Vent Mode: PRVC FiO2 (%):  [40 %-50 %] 50 % Set Rate:  [15 bmp] 15 bmp Vt Set:  [500 mL] 500 mL PEEP:  [5 cmH20] 5  cmH20 Plateau Pressure:  [19 cmH20] 19 cmH20 CBC    Component Value Date/Time   WBC 11.7 (H) 01/24/2021 0435   RBC 3.98 01/24/2021 0435   HGB 10.7 (L) 01/24/2021 0435   HCT 35.6 (L) 01/24/2021 0435   PLT 321 01/24/2021 0435   MCV 89.4 01/24/2021 0435   MCH 26.9 01/24/2021 0435   MCHC 30.1 01/24/2021 0435   RDW 15.0 01/24/2021 0435   LYMPHSABS 1.0 01/24/2021 0435   MONOABS 0.7 01/24/2021 0435   EOSABS 0.1 01/24/2021 0435   BASOSABS 0.1 01/24/2021 0435   BMP Latest Ref Rng & Units 01/23/2021 01/23/2021 01/23/2021  Glucose 70 - 99 mg/dL - - 656(C)  BUN 6 - 20 mg/dL - - 12(X)  Creatinine 5.17 - 1.00 mg/dL - - 0.01  Sodium 749 - 145 mmol/L - - 140  Potassium 3.5 - 5.1 mmol/L 4.9 4.4 5.6(H)  Chloride 98 - 111 mmol/L - - 109  CO2 22 - 32 mmol/L - - 23  Calcium 8.9 - 10.3 mg/dL - - 8.9    BP (!) 449/67   Pulse 94   Temp 98.24 F (36.8 C)   Resp 13   Ht 5\' 7"  (1.702 m)   Wt (!) 183.7 kg   SpO2 95%   BMI 63.43 kg/m    I/O last 3 completed shifts: In: 9725.7 [I.V.:3273.2; NG/GT:5285; IV Piggyback:1167.5] Out: 5520 [Urine:4500; Emesis/NG output:150; Stool:870] No intake/output data recorded.  SpO2: 95 % O2 Flow Rate (L/min): 45 L/min FiO2 (%): (S) 50 %  Estimated body mass index is 63.43 kg/m as calculated from the following:   Height as of this encounter: 5\' 7"  (1.702 m).   Weight as of this encounter: 183.7 kg.  SIGNIFICANT EVENTS   REVIEW OF SYSTEMS  PATIENT IS UNABLE  TO PROVIDE COMPLETE REVIEW OF SYSTEMS DUE TO SEVERE CRITICAL ILLNESS        PHYSICAL EXAMINATION:  GENERAL:critically ill appearing, +resp distress EYES: Pupils equal, round, reactive to light.  No scleral icterus.  MOUTH: Moist mucosal membrane. NECK: Supple.  PULMONARY: +rhonchi, +wheezing CARDIOVASCULAR: S1 and S2. Regular rate and rhythm. No murmurs, rubs, or gallops.  GASTROINTESTINAL: Soft, nontender, -distended.  Positive bowel sounds.   MUSCULOSKELETAL: No swelling, clubbing, or  edema.  NEUROLOGIC: obtunded, GCS<8 SKIN:intact,warm,dry  MEDICATIONS: I have reviewed all medications and confirmed regimen as documented   CULTURE RESULTS   Recent Results (from the past 240 hour(s))  Blood Culture (routine x 2)     Status: None   Collection Time: 01/25/2021  2:56 PM   Specimen: BLOOD  Result Value Ref Range Status   Specimen Description BLOOD BLOOD LEFT ARM  Final   Special Requests   Final    BOTTLES DRAWN AEROBIC AND ANAEROBIC Blood Culture results may not be optimal due to an inadequate volume of blood received in culture bottles   Culture   Final    NO GROWTH 5 DAYS Performed at [facility], [address] [place], [place] [address]    Report Status 01/19/2021 FINAL  Final  Blood Culture (routine x 2)     Status: None   Collection Time: 01/18/2021  2:59 PM   Specimen: BLOOD  Result Value Ref Range Status   Specimen Description BLOOD BLOOD LEFT FOREARM  Final   Special Requests   Final    BOTTLES DRAWN AEROBIC AND ANAEROBIC Blood Culture adequate volume   Culture   Final    NO GROWTH 5 DAYS Performed at [facility], [address] [place], [place] [address]    Report Status 01/19/2021 FINAL  Final  Culture, respiratory     Status: None   Collection Time: 01/15/21  5:50 PM   Specimen: Tracheal Aspirate; Respiratory  Result Value Ref Range Status   Specimen Description   Final    TRACHEAL ASPIRATE Performed at [facility], [address], [place], [place] [address]    Special Requests   Final    NONE Performed at [facility], [address] [place], [place] [address]    Gram Stain   Final    FEW WBC PRESENT, PREDOMINANTLY PMN FEW GRAM NEGATIVE RODS FEW GRAM POSITIVE RODS RARE GRAM POSITIVE COCCI    Culture   Final    Normal respiratory flora-no Staph aureus or Pseudomonas seen Performed at [facility], [address], [place], [place] [address]    Report Status 01/18/2021 FINAL  Final   Culture, respiratory (non-expectorated)     Status: None   Collection Time: 01/19/21  6:20 PM   Specimen: Tracheal Aspirate; Respiratory  Result Value Ref Range Status   Specimen Description   Final    TRACHEAL ASPIRATE Performed at [facility], [address] [place], [place] [address]    Special Requests   Final    NONE Performed at [facility], [address] [place], [place] [address]    Gram Stain   Final    ABUNDANT WBC PRESENT, PREDOMINANTLY PMN FEW GRAM POSITIVE COCCI RARE GRAM NEGATIVE RODS RARE GRAM POSITIVE RODS    Culture   Final    Normal respiratory flora-no Staph aureus or Pseudomonas seen Performed at [facility], [address] [place], [place] [address]    Report Status 01/22/2021 FINAL  Final  CULTURE, BLOOD (ROUTINE X 2) w Reflex to ID Panel     Status: Abnormal   Collection Time: 01/19/21  6:45 PM   Specimen: BLOOD  Result Value Ref Range Status   Specimen Description   Final    BLOOD BLOOD LEFT HAND Performed at [facility], [address] [place], [place] [address]    Special Requests   Final    BOTTLES DRAWN AEROBIC AND ANAEROBIC Blood Culture adequate volume Performed at [facility], [address] [place], [place] [address]    Culture  Setup Time   Final    GRAM POSITIVE COCCI IN BOTH AEROBIC AND ANAEROBIC BOTTLES CRITICAL RESULT CALLED TO, READ BACK BY AND VERIFIED WITH: BRANDON BEERS AT 1539 ON 01/20/21 BY SS    Culture (A)  Final    STAPHYLOCOCCUS EPIDERMIDIS THE SIGNIFICANCE OF ISOLATING THIS ORGANISM FROM A SINGLE SET OF BLOOD CULTURES WHEN MULTIPLE SETS ARE DRAWN IS UNCERTAIN. PLEASE NOTIFY THE MICROBIOLOGY DEPARTMENT WITHIN ONE WEEK IF SPECIATION AND SENSITIVITIES ARE REQUIRED. Performed at [facility], [address] [place], [place] [address]    Report Status 01/23/2021 FINAL  Final  Blood Culture ID Panel (Reflexed)     Status: Abnormal   Collection Time: 01/19/21  6:45  PM  Result Value Ref Range Status   Enterococcus faecalis NOT DETECTED NOT DETECTED Final   Enterococcus Faecium NOT DETECTED NOT DETECTED Final   Listeria monocytogenes NOT DETECTED NOT DETECTED Final   Staphylococcus species DETECTED (A) NOT DETECTED Final    Comment: CRITICAL RESULT CALLED TO, READ BACK BY AND VERIFIED WITH: BRANDON BEERS AT 1539 ON 01/20/21 BY SS    Staphylococcus aureus (BCID) NOT DETECTED NOT DETECTED Final   Staphylococcus epidermidis DETECTED (A) NOT DETECTED Final    Comment: Methicillin (oxacillin) resistant coagulase negative staphylococcus. Possible blood culture contaminant (unless isolated from more than one blood culture draw or clinical case suggests pathogenicity). No antibiotic treatment is indicated for blood  culture contaminants. CRITICAL RESULT CALLED TO, READ BACK BY AND VERIFIED WITH: BRANDON BEERS AT 1539 ON 01/20/21 BY SS    Staphylococcus lugdunensis NOT DETECTED NOT DETECTED Final   Streptococcus species NOT DETECTED NOT DETECTED Final   Streptococcus agalactiae NOT DETECTED NOT DETECTED Final   Streptococcus pneumoniae NOT DETECTED NOT DETECTED Final   Streptococcus pyogenes NOT DETECTED NOT DETECTED Final   A.calcoaceticus-baumannii NOT DETECTED NOT DETECTED Final   Bacteroides fragilis NOT DETECTED NOT DETECTED Final   Enterobacterales NOT DETECTED NOT DETECTED Final   Enterobacter cloacae complex NOT DETECTED NOT DETECTED Final   Escherichia coli NOT DETECTED NOT DETECTED Final   Klebsiella aerogenes NOT DETECTED NOT DETECTED Final   Klebsiella oxytoca NOT DETECTED NOT DETECTED Final   Klebsiella pneumoniae NOT DETECTED NOT DETECTED Final   Proteus species NOT DETECTED NOT DETECTED Final   Salmonella species NOT DETECTED NOT DETECTED Final   Serratia marcescens NOT DETECTED NOT DETECTED Final   Haemophilus influenzae NOT DETECTED NOT DETECTED Final   Neisseria meningitidis NOT DETECTED NOT DETECTED Final   Pseudomonas aeruginosa NOT  DETECTED NOT DETECTED Final   Stenotrophomonas maltophilia NOT DETECTED NOT DETECTED Final   Candida albicans NOT DETECTED NOT DETECTED Final   Candida auris NOT DETECTED NOT DETECTED Final   Candida glabrata NOT DETECTED NOT DETECTED Final   Candida krusei NOT DETECTED NOT DETECTED Final   Candida parapsilosis NOT DETECTED NOT DETECTED Final   Candida tropicalis NOT DETECTED NOT DETECTED Final  Cryptococcus neoformans/gattii NOT DETECTED NOT DETECTED Final   Methicillin resistance mecA/C DETECTED (A) NOT DETECTED Final    Comment: CRITICAL RESULT CALLED TO, READ BACK BY AND VERIFIED WITH: BRANDON BEERS AT 1539 ON 01/20/21 BY SS Performed at [facility], [address] [place], [place] [address]   CULTURE, BLOOD (ROUTINE X 2) w Reflex to ID Panel     Status: None   Collection Time: 01/19/21  6:47 PM   Specimen: BLOOD  Result Value Ref Range Status   Specimen Description BLOOD BLOOD RIGHT HAND  Final   Special Requests   Final    BOTTLES DRAWN AEROBIC AND ANAEROBIC Blood Culture adequate volume   Culture   Final    NO GROWTH 5 DAYS Performed at [facility], [address] [place], [place] [address]    Report Status 01/24/2021 FINAL  Final  MRSA PCR Screening     Status: None   Collection Time: 01/20/21  7:58 AM   Specimen: Nasal Mucosa; Nasopharyngeal  Result Value Ref Range Status   MRSA by PCR NEGATIVE NEGATIVE Final    Comment:        The GeneXpert MRSA Assay (FDA approved for NASAL specimens only), is one component of a comprehensive MRSA colonization surveillance program. It is not intended to diagnose MRSA infection nor to guide or monitor treatment for MRSA infections. Performed at [facility], [address] [place], [place] [address]   CULTURE, BLOOD (ROUTINE X 2) w Reflex to ID Panel     Status: None (Preliminary result)   Collection Time: 01/22/21  1:02 PM   Specimen: BLOOD  Result Value Ref Range Status   Specimen  Description BLOOD BLOOD LEFT HAND  Final   Special Requests   Final    BOTTLES DRAWN AEROBIC AND ANAEROBIC Blood Culture adequate volume   Culture   Final    NO GROWTH 2 DAYS Performed at [facility], [address], [place], [place] [address]    Report Status PENDING  Incomplete  CULTURE, BLOOD (ROUTINE X 2) w Reflex to ID Panel     Status: None (Preliminary result)   Collection Time: 01/22/21  1:02 PM   Specimen: BLOOD  Result Value Ref Range Status   Specimen Description BLOOD BLOOD RIGHT HAND  Final   Special Requests   Final    BOTTLES DRAWN AEROBIC AND ANAEROBIC Blood Culture adequate volume   Culture   Final    NO GROWTH 2 DAYS Performed at [facility], [address], [place], [place] [address]    Report Status PENDING  Incomplete          IMAGING    [place] EKG SITE RITE  Result Date: 01/23/2021 If Site Rite image not attached, placement could not be confirmed due to current cardiac rhythm.    Nutrition Status: Nutrition Problem: Inadequate oral intake Etiology: inability to eat (pt sedated and ventilated) Signs/Symptoms: NPO status       Indwelling Urinary Catheter continued, requirement due to   Reason to continue Indwelling Urinary Catheter strict Intake/Output monitoring for hemodynamic instability   Central Line/ continued, requirement due to  Reason to continue [employer] Monitoring of central venous pressure or other hemodynamic parameters and poor IV access   Ventilator continued, requirement due to severe respiratory failure   Ventilator Sedation RASS 0 to -2      ASSESSMENT AND PLAN SYNOPSIS    55 year old unvaccinated female admitted with acute hypoxic respiratory failure in  the setting of COVID-19 pneumonia   Acute hypoxemic respiratory failure due to COVID-19 pneumonia/ARDS Mechanical ventilation via ARDS protocol, target PRVC 6 cc/kg Wean PEEP and FiO2 as able Goal plateau pressure less than 30, driving  pressure less than 15 Paralytics if necessary for vent synchrony, gas exchange Deep sedation per PAD protocol diuresis as tolerated based on Kidney function VAP prevention order set Follow inflammatory markers as needed with CRP Vitamin C, zinc as indicated Plan to repeat and check resp cultures if needed   Severe ACUTE Hypoxic and Hypercapnic Respiratory Failure -continue Full MV support -continue Bronchodilator Therapy -Wean Fio2 and PEEP as tolerated -VAP/VENT bundle implementation  ACUTE DIASTOLIC CARDIAC FAILURE- EF -oxygen as needed -Lasix as tolerated   Morbid obesity, possible OSA.   Will certainly impact respiratory mechanics, ventilator weaning Suspect will need to consider additional PEEP   ACUTE KIDNEY INJURY/Renal Failure -continue Foley Catheter-assess need -Avoid nephrotoxic agents -Follow urine output, BMP -Ensure adequate renal perfusion, optimize oxygenation -Renal dose medications Follow up Nephrology consultation    NEUROLOGY Acute toxic metabolic encephalopathy, need for sedation Goal RASS -2 to -3 Wake up assessment pending  CARDIAC ICU monitoring  INFECTIOUS DISEASE -continue antibiotics as prescribed -follow up cultures -follow up ID consultation Repeat cultures pending    GI GI PROPHYLAXIS as indicated   DIET-->TF's as tolerated Constipation protocol as indicated  ENDO - will use ICU hypoglycemic\Hyperglycemia protocol if indicated     ELECTROLYTES -follow labs as needed -replace as needed -pharmacy consultation and following   DVT/GI PRX ordered and assessed TRANSFUSIONS AS NEEDED MONITOR FSBS I Assessed the need for Labs I Assessed the need for Foley I Assessed the need for Central Venous Line Family Discussion when available I Assessed the need for Mobilization I made an Assessment of medications to be adjusted accordingly Safety Risk assessment completed   CASE DISCUSSED IN MULTIDISCIPLINARY ROUNDS WITH ICU  TEAM  Critical Care Time devoted to patient care services described in this note is 47 minutes.   Overall, patient is critically ill, prognosis is guarded.  Patient with Multiorgan failure and at high risk for cardiac arrest and death.    Lucie Leather, M.D.  [facility]  Medical Director [facility] Medical Director [facility] Cardio-Pulmonary Department

## 2021-01-24 NOTE — Consult Note (Signed)
PHARMACY CONSULT NOTE - FOLLOW UP  Pharmacy Consult for Electrolyte Monitoring and Replacement   Recent Labs: Potassium (mmol/L)  Date Value  01/24/2021 5.2 (H)   Magnesium (mg/dL)  Date Value  92/42/6834 2.2   Calcium (mg/dL)  Date Value  19/62/2297 9.0   Albumin (g/dL)  Date Value  98/92/1194 2.3 (L)   Phosphorus (mg/dL)  Date Value  17/40/8144 7.3 (H)   Sodium (mmol/L)  Date Value  01/24/2021 141   Assessment: 55 yo F with COVID-19. PMH includes HTN, diabetes, and IBS. Pt intubated 2/4. Pharmacy consulted for electrolyte monitoring and replacement.   Goal of Therapy:  Electrolytes WNL  Plan:  2/13 0435  K 5.2  Mag 2.2  Phos 7.3  Scr 1.33 Persistent hyperkalemia the past few days requiring treatment with IV insulin.  Off lokelma now Nephrology note 2/13: suspect persistent hyperkalemia secondary to constipation and earlier acute kidney injury. Bumex 1mg  IV  x 1 dose given 2/12   Will follow potassium trend and adjust medications as indicated. Continue to follow along.  4/12, PharmD Clinical Pharmacist  01/24/2021 11:16 AM

## 2021-01-24 NOTE — Consult Note (Signed)
Pharmacy Antibiotic Note  Dana Bruce is a 55 y.o. female admitted on 02-Feb-2021 with covid+ pneumonia. Patient was originally started on vancomycin which was changed to cefepime on rounds. Blood cultures returned with 1/2 sets poss. MRSE, which could likely be a contaminant. Because patient was still spiking fevers, vancomycin was added back to regimen. Patient continues to have fevers. Repeat blood cultures have been ordered. Pharmacy has been consulted for Vancomycin dosing.  -Failure to wean from vent x several days -Bcx 2/8: 2 of 4 (1 set)= GPC: BCID: MRSE -Bcx 2/11: NGx2d  Plan: Scr worse 1.16 > 0.95 > 1.33 Received Bumex 1 mg IV x 1 yesterday 2/12 -Will adjust vancomycin from 1250 IV q12h to 2000 mg IV q24h Expected AUC 506 Cmin 13.1 Scr used 1.33 -obese patient- watch for accumulation, nephrology following  -Continues on cefepime 2g IV q8h  Follow repeat blood cultures and renal function.    Height: 5\' 7"  (170.2 cm) Weight:  (bed not zeroed, pt weight not accurate) IBW/kg (Calculated) : 61.6  Temp (24hrs), Avg:98.4 F (36.9 C), Min:97.7 F (36.5 C), Max:98.96 F (37.2 C)  Recent Labs  Lab 01/19/21 0732 01/21/21 0425 01/21/21 1500 01/22/21 0316 01/23/21 0046 01/24/21 0435  WBC 9.3 9.9  --  8.7 9.6 11.7*  CREATININE 1.66* 1.55* 1.21* 1.16* 0.95 1.33*    Estimated Creatinine Clearance: 83.3 mL/min (A) (by C-G formula based on SCr of 1.33 mg/dL (H)).    Allergies  Allergen Reactions  . Lasix [Furosemide] Itching  . Penicillins Hives and Itching    Has patient had a PCN reaction causing immediate rash, facial/tongue/throat swelling, SOB or lightheadedness with hypotension: yes Has patient had a PCN reaction causing severe rash involving mucus membranes or skin necrosis: no Has patient had a PCN reaction that required hospitalization No Has patient had a PCN reaction occurring within the last 10 years: No If all of the above answers are "NO", then may proceed  with Cephalosporin use.   01/26/21 Flavor [Flavoring Agent] Itching, Nausea And Vomiting and Rash    Breaks out in places where she touches strawberries     Antimicrobials this admission: Vancomycin 2/9 >> Cefepime 2/8 >> Levofloxacin 2/4 >> 2/8  Microbiology results: 2/3 BCx: NG5D 2/4 Resp Cx: normal flora 2/8 Resp Cx: normal flora  2/8 BCx: GPC - MRSE in 2 of 4 (1 set) 2/9 MRSA PCR neg 2/11 BCx:NG <24 hrs  Thank you for allowing pharmacy to be a part of this patient's care.  Liberato Stansbery A, PharmD 01/24/2021 11:23 AM

## 2021-01-24 NOTE — Progress Notes (Signed)
[facility]  ROUNDING NOTE   Subjective:   Bumex 67m IV x 2 - UOP 29071mK 5.2 (4.9)  Objective:  Vital signs in last 24 hours:  Temp:  [97.7 F (36.5 C)-99.86 F (37.7 C)] 98.42 F (36.9 C) (02/13 0800) Pulse Rate:  [74-105] 74 (02/13 0800) Resp:  [12-28] 14 (02/13 0800) BP: (107-161)/(50-81) 107/50 (02/13 0800) SpO2:  [90 %-96 %] 94 % (02/13 0800) FiO2 (%):  [40 %-50 %] 50 % (02/13 0818)  Weight change:  Filed Weights   02/08/2021 1508 01/15/21 0420 01/22/21 0445  Weight: (!) 187.8 kg (!) 179.6 kg (!) 183.7 kg    Intake/Output: I/O last 3 completed shifts: In: 9725.7 [I.V.:3273.2; NG/GT:5285; IV Piggyback:1167.5] Out: 558768Urine:4500; Emesis/NG output:150; Stool:870]   Intake/Output this shift:  No intake/output data recorded.  Physical Exam: General: Critically ill  Head: ETT  Eyes: Anicteric   Neck:  trachea midline  Lungs:   PRVC FiO2 40%  Heart: Regular rate and rhythm  Abdomen:  Soft, nontender  Extremities:  + peripheral edema.  Neurologic: Intubated and sedated  Skin: No lesions  Access: none    Basic Metabolic Panel: Recent Labs  Lab 01/18/21 0458 01/19/21 0732 01/21/21 0425 01/21/21 1500 01/21/21 1910 01/22/21 0316 01/22/21 1029 01/22/21 1652 01/23/21 0046 01/23/21 1208 01/23/21 1650 01/24/21 0435  NA 139 140 139 139  --  142  --   --  140  --   --   --   K 4.5 4.6 5.7* 6.0*  6.1*   < > 5.8* 4.7 5.6* 5.6*  5.5* 4.4 4.9  --   CL 103 102 106 108  --  109  --   --  109  --   --   --   CO2 27 26 24  21*  --  23  --   --  23  --   --   --   GLUCOSE 283* 228* 352* 307*  --  258*  --   --  239*  --   --   --   BUN 60* 69* 76* 72*  --  67*  --   --  61*  --   --   --   CREATININE 2.05* 1.66* 1.55* 1.21*  --  1.16*  --   --  0.95  --   --   --   CALCIUM 8.0* 8.2* 8.3* 8.6*  --  8.9  --   --  8.9  --   --   --   MG 1.7  --  2.4  --   --  2.4  --   --  2.0  --   --  2.2  PHOS 3.4  --  4.8*  --   --  2.7  --   --  3.7  --   --   --     < > = values in this interval not displayed.    Liver Function Tests: Recent Labs  Lab 01/18/21 0458 01/19/21 0732 01/21/21 0425 01/22/21 0316 01/23/21 0046  AST 18 14*  --   --   --   ALT 16 14  --   --   --   ALKPHOS 43 44  --   --   --   BILITOT 1.0 0.9  --   --   --   PROT 5.7* 5.9*  --   --   --   ALBUMIN 2.2* 2.1* 2.1* 2.2* 2.3*   No results for  input(s): LIPASE, AMYLASE in the last 168 hours. No results for input(s): AMMONIA in the last 168 hours.  CBC: Recent Labs  Lab 01/19/21 0732 01/21/21 0425 01/22/21 0316 01/23/21 0046 01/24/21 0435  WBC 9.3 9.9 8.7 9.6 11.7*  NEUTROABS  --  7.0 5.9 6.9 9.1*  HGB 10.9* 10.9* 11.4* 10.6* 10.7*  HCT 34.7* 36.2 37.5 35.5* 35.6*  MCV 85.9 86.8 86.6 86.2 89.4  PLT 303 263 286 269 321    Cardiac Enzymes: No results for input(s): CKTOTAL, CKMB, CKMBINDEX, TROPONINI in the last 168 hours.  BNP: Invalid input(s): POCBNP  CBG: Recent Labs  Lab 01/23/21 1540 01/23/21 1924 01/23/21 2313 01/24/21 0312 01/24/21 0736  GLUCAP 249* 185* 207* 260* 220*    Microbiology: Results for orders placed or performed during the hospital encounter of 01/18/2021  Blood Culture (routine x 2)     Status: None   Collection Time: 01/25/2021  2:56 PM   Specimen: BLOOD  Result Value Ref Range Status   Specimen Description BLOOD BLOOD LEFT ARM  Final   Special Requests   Final    BOTTLES DRAWN AEROBIC AND ANAEROBIC Blood Culture results may not be optimal due to an inadequate volume of blood received in culture bottles   Culture   Final    NO GROWTH 5 DAYS Performed at [facility], [address] [place], [place] [address]    Report Status 01/19/2021 FINAL  Final  Blood Culture (routine x 2)     Status: None   Collection Time: 02/02/2021  2:59 PM   Specimen: BLOOD  Result Value Ref Range Status   Specimen Description BLOOD BLOOD LEFT FOREARM  Final   Special Requests   Final    BOTTLES DRAWN AEROBIC AND ANAEROBIC Blood  Culture adequate volume   Culture   Final    NO GROWTH 5 DAYS Performed at [facility], [address], [place], [place] [address]    Report Status 01/19/2021 FINAL  Final  Culture, respiratory     Status: None   Collection Time: 01/15/21  5:50 PM   Specimen: Tracheal Aspirate; Respiratory  Result Value Ref Range Status   Specimen Description   Final    TRACHEAL ASPIRATE Performed at [facility], [address], [place], [place] [address]    Special Requests   Final    NONE Performed at [facility], [address] [place], [place] [address]    Gram Stain   Final    FEW WBC PRESENT, PREDOMINANTLY PMN FEW GRAM NEGATIVE RODS FEW GRAM POSITIVE RODS RARE GRAM POSITIVE COCCI    Culture   Final    Normal respiratory flora-no Staph aureus or Pseudomonas seen Performed at [facility], [place] [address], [place], [place] [address]    Report Status 01/18/2021 FINAL  Final  Culture, respiratory (non-expectorated)     Status: None   Collection Time: 01/19/21  6:20 PM   Specimen: Tracheal Aspirate; Respiratory  Result Value Ref Range Status   Specimen Description   Final    TRACHEAL ASPIRATE Performed at [facility], [address] [place], [place] [address]    Special Requests   Final    NONE Performed at [facility], [address] [place], [place] [address]    Gram Stain   Final    ABUNDANT WBC PRESENT, PREDOMINANTLY PMN FEW GRAM POSITIVE COCCI RARE GRAM NEGATIVE RODS RARE GRAM POSITIVE RODS    Culture   Final  Normal respiratory flora-no Staph aureus or Pseudomonas seen Performed at [facility] [address] [place], [place] [address]    Report Status 01/22/2021 FINAL  Final  CULTURE, BLOOD (ROUTINE X 2) w Reflex to ID Panel     Status: Abnormal   Collection Time: 01/19/21  6:45 PM   Specimen: BLOOD  Result Value Ref Range Status   Specimen Description   Final    BLOOD BLOOD LEFT HAND Performed  at [facility], [address], [place], [place] [address]    Special Requests   Final    BOTTLES DRAWN AEROBIC AND ANAEROBIC Blood Culture adequate volume Performed at [facility], [address] [place], [place] [address]    Culture  Setup Time   Final    GRAM POSITIVE COCCI IN BOTH AEROBIC AND ANAEROBIC BOTTLES CRITICAL RESULT CALLED TO, READ BACK BY AND VERIFIED WITH: BRANDON BEERS AT 6967 ON 01/20/21 BY SS    Culture (A)  Final    STAPHYLOCOCCUS EPIDERMIDIS THE SIGNIFICANCE OF ISOLATING THIS ORGANISM FROM A SINGLE SET OF BLOOD CULTURES WHEN MULTIPLE SETS ARE DRAWN IS UNCERTAIN. PLEASE NOTIFY THE MICROBIOLOGY DEPARTMENT WITHIN ONE WEEK IF SPECIATION AND SENSITIVITIES ARE REQUIRED. Performed at [facility], [place] [address] [place], [place] [address]    Report Status 01/23/2021 FINAL  Final  Blood Culture ID Panel (Reflexed)     Status: Abnormal   Collection Time: 01/19/21  6:45 PM  Result Value Ref Range Status   Enterococcus faecalis NOT DETECTED NOT DETECTED Final   Enterococcus Faecium NOT DETECTED NOT DETECTED Final   Listeria monocytogenes NOT DETECTED NOT DETECTED Final   Staphylococcus species DETECTED (A) NOT DETECTED Final    Comment: CRITICAL RESULT CALLED TO, READ BACK BY AND VERIFIED WITH: BRANDON BEERS AT 1539 ON 01/20/21 BY SS    Staphylococcus aureus (BCID) NOT DETECTED NOT DETECTED Final   Staphylococcus epidermidis DETECTED (A) NOT DETECTED Final    Comment: Methicillin (oxacillin) resistant coagulase negative staphylococcus. Possible blood culture contaminant (unless isolated from more than one blood culture draw or clinical case suggests pathogenicity). No antibiotic treatment is indicated for blood  culture contaminants. CRITICAL RESULT CALLED TO, READ BACK BY AND VERIFIED WITH: BRANDON BEERS AT 0175 ON 01/20/21 BY SS    Staphylococcus lugdunensis NOT DETECTED NOT DETECTED Final   Streptococcus species NOT DETECTED NOT DETECTED Final    Streptococcus agalactiae NOT DETECTED NOT DETECTED Final   Streptococcus pneumoniae NOT DETECTED NOT DETECTED Final   Streptococcus pyogenes NOT DETECTED NOT DETECTED Final   A.calcoaceticus-baumannii NOT DETECTED NOT DETECTED Final   Bacteroides fragilis NOT DETECTED NOT DETECTED Final   Enterobacterales NOT DETECTED NOT DETECTED Final   Enterobacter cloacae complex NOT DETECTED NOT DETECTED Final   Escherichia coli NOT DETECTED NOT DETECTED Final   Klebsiella aerogenes NOT DETECTED NOT DETECTED Final   Klebsiella oxytoca NOT DETECTED NOT DETECTED Final   Klebsiella pneumoniae NOT DETECTED NOT DETECTED Final   Proteus species NOT DETECTED NOT DETECTED Final   Salmonella species NOT DETECTED NOT DETECTED Final   Serratia marcescens NOT DETECTED NOT DETECTED Final   Haemophilus influenzae NOT DETECTED NOT DETECTED Final   Neisseria meningitidis NOT DETECTED NOT DETECTED Final   Pseudomonas aeruginosa NOT DETECTED NOT DETECTED Final   Stenotrophomonas maltophilia NOT DETECTED NOT DETECTED Final   Candida albicans NOT DETECTED NOT DETECTED Final   Candida auris NOT DETECTED NOT DETECTED Final   Candida glabrata NOT DETECTED NOT DETECTED Final  Candida krusei NOT DETECTED NOT DETECTED Final   Candida parapsilosis NOT DETECTED NOT DETECTED Final   Candida tropicalis NOT DETECTED NOT DETECTED Final   Cryptococcus neoformans/gattii NOT DETECTED NOT DETECTED Final   Methicillin resistance mecA/C DETECTED (A) NOT DETECTED Final    Comment: CRITICAL RESULT CALLED TO, READ BACK BY AND VERIFIED WITH: BRANDON BEERS AT 1539 ON 01/20/21 BY SS Performed at [facility], [address] [place], [place] [address]   CULTURE, BLOOD (ROUTINE X 2) w Reflex to ID Panel     Status: None   Collection Time: 01/19/21  6:47 PM   Specimen: BLOOD  Result Value Ref Range Status   Specimen Description BLOOD BLOOD RIGHT HAND  Final   Special Requests   Final    BOTTLES DRAWN AEROBIC AND ANAEROBIC  Blood Culture adequate volume   Culture   Final    NO GROWTH 5 DAYS Performed at [facility], [address] [place], [place] [address]    Report Status 01/24/2021 FINAL  Final  MRSA PCR Screening     Status: None   Collection Time: 01/20/21  7:58 AM   Specimen: Nasal Mucosa; Nasopharyngeal  Result Value Ref Range Status   MRSA by PCR NEGATIVE NEGATIVE Final    Comment:        The GeneXpert MRSA Assay (FDA approved for NASAL specimens only), is one component of a comprehensive MRSA colonization surveillance program. It is not intended to diagnose MRSA infection nor to guide or monitor treatment for MRSA infections. Performed at [facility], [address] [place], [place] [address]   CULTURE, BLOOD (ROUTINE X 2) w Reflex to ID Panel     Status: None (Preliminary result)   Collection Time: 01/22/21  1:02 PM   Specimen: BLOOD  Result Value Ref Range Status   Specimen Description BLOOD BLOOD LEFT HAND  Final   Special Requests   Final    BOTTLES DRAWN AEROBIC AND ANAEROBIC Blood Culture adequate volume   Culture   Final    NO GROWTH 2 DAYS Performed at [facility], [address] [place], [place] [address]    Report Status PENDING  Incomplete  CULTURE, BLOOD (ROUTINE X 2) w Reflex to ID Panel     Status: None (Preliminary result)   Collection Time: 01/22/21  1:02 PM   Specimen: BLOOD  Result Value Ref Range Status   Specimen Description BLOOD BLOOD RIGHT HAND  Final   Special Requests   Final    BOTTLES DRAWN AEROBIC AND ANAEROBIC Blood Culture adequate volume   Culture   Final    NO GROWTH 2 DAYS Performed at [facility], [address], [place],  [address]    Report Status PENDING  Incomplete    Coagulation Studies: No results for input(s): LABPROT, INR in the last 72 hours.  Urinalysis: No results for input(s): COLORURINE, LABSPEC, PHURINE, GLUCOSEU, HGBUR, BILIRUBINUR, KETONESUR, PROTEINUR, UROBILINOGEN,  NITRITE, LEUKOCYTESUR in the last 72 hours.  Invalid input(s): APPERANCEUR    Imaging: [place] EKG SITE RITE  Result Date: 01/23/2021 If Site Rite image not attached, placement could not be confirmed due to current cardiac rhythm.    Medications:   . sodium chloride 5 mL/hr at 01/23/21 1839  . ceFEPime (MAXIPIME) IV 2 g (01/24/21 0522)  . famotidine (PEPCID) IV 20 mg (01/23/21 2215)  . feeding supplement (NEPRO CARB STEADY) 1,000 mL (01/23/21 1339)  . fentaNYL infusion INTRAVENOUS 300 mcg/hr (01/24/21 0149)  .  midazolam 4 mg/hr (01/24/21 0826)  . vancomycin 1,250 mg (01/23/21 2240)   . amLODipine  10 mg Per Tube Daily  . vitamin C  1,000 mg Per Tube TID  . chlorhexidine gluconate (MEDLINE KIT)  15 mL Mouth Rinse BID  . Chlorhexidine Gluconate Cloth  6 each Topical Daily  . enoxaparin (LOVENOX) injection  0.5 mg/kg Subcutaneous Q24H  . feeding supplement (PROSource TF)  45 mL Per Tube QID  . free water  200 mL Per Tube Q4H  . insulin aspart  0-20 Units Subcutaneous Q4H  . insulin aspart  5 Units Subcutaneous Q4H  . insulin detemir  22 Units Subcutaneous Daily  . lactulose  10 g Per Tube BID  . mouth rinse  15 mL Mouth Rinse 10 times per day  . methylPREDNISolone (SOLU-MEDROL) injection  20 mg Intravenous Q12H  . metoprolol tartrate  100 mg Per Tube BID  . polyethylene glycol  17 g Per Tube Daily  . senna-docusate  2 tablet Per Tube BID  . sodium chloride flush  10-40 mL Intracatheter Q12H  . sodium chloride flush  10-40 mL Intracatheter Q12H  . sodium chloride flush  3 mL Intravenous Q12H  . sodium zirconium cyclosilicate  10 g Per Tube BID   sodium chloride, acetaminophen (TYLENOL) oral liquid 160 mg/5 mL, albuterol, guaiFENesin-dextromethorphan, labetalol, midazolam, midazolam, ondansetron (ZOFRAN) IV, sodium chloride flush, sodium chloride flush, sodium chloride flush  Assessment/ Plan:  Dana Bruce is a 55 y.o. white female with hypertension, IBS, diabetes mellitus  type II, who was admitted to [facility] on 02/08/2021 for Acute respiratory failure with hypoxia ([place]) [J96.01] Pneumonia due to COVID-19 virus [U07.1, J12.82] COVID-19 [U07.1]  Patient remains intubated and requiring mechanical ventilation. Nephrology consulted for persistent hyperkalemia  1. Hyperkalemia: with acute kidney injury. Potassium goal <5.5 - resume lokelma  - IV bumex x 1 yesterday. IV bumex 0.36m today - Evaluate daily for diuretics.  - Hold home medication of telmisartan.   2. Acute kidney injury:  Nonoliguric urine output. Responding to diuretics.   3. Hypertension - Continue amlodipine - IV diuretics as above.   4. 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 On metformin. Hemoglobin A1c at goal at 6.3%.  - holding metformin.    LOS: 1[place]2/13/20228:55 AM

## 2021-01-25 ENCOUNTER — Inpatient Hospital Stay: Payer: Managed Care, Other (non HMO)

## 2021-01-25 DIAGNOSIS — U071 COVID-19: Secondary | ICD-10-CM | POA: Diagnosis not present

## 2021-01-25 DIAGNOSIS — J9601 Acute respiratory failure with hypoxia: Secondary | ICD-10-CM | POA: Diagnosis not present

## 2021-01-25 LAB — GLUCOSE, CAPILLARY
Glucose-Capillary: 242 mg/dL — ABNORMAL HIGH (ref 70–99)
Glucose-Capillary: 186 mg/dL — ABNORMAL HIGH (ref 70–99)
Glucose-Capillary: 152 mg/dL — ABNORMAL HIGH (ref 70–99)
Glucose-Capillary: 228 mg/dL — ABNORMAL HIGH (ref 70–99)
Glucose-Capillary: 216 mg/dL — ABNORMAL HIGH (ref 70–99)

## 2021-01-25 LAB — CBC WITH DIFFERENTIAL/PLATELET
Abs Immature Granulocytes: 0.88 10*3/uL — ABNORMAL HIGH (ref 0.00–0.07)
Basophils Absolute: 0.1 10*3/uL (ref 0.0–0.1)
Basophils Relative: 1 %
Eosinophils Absolute: 0.1 10*3/uL (ref 0.0–0.5)
Eosinophils Relative: 1 %
HCT: 31.8 % — ABNORMAL LOW (ref 36.0–46.0)
Hemoglobin: 9.8 g/dL — ABNORMAL LOW (ref 12.0–15.0)
Immature Granulocytes: 7 %
Lymphocytes Relative: 8 %
Lymphs Abs: 1 10*3/uL (ref 0.7–4.0)
MCH: 27.2 pg (ref 26.0–34.0)
MCHC: 30.8 g/dL (ref 30.0–36.0)
MCV: 88.3 fL (ref 80.0–100.0)
Monocytes Absolute: 1.1 10*3/uL — ABNORMAL HIGH (ref 0.1–1.0)
Monocytes Relative: 8 %
Neutro Abs: 9.8 10*3/uL — ABNORMAL HIGH (ref 1.7–7.7)
Neutrophils Relative %: 75 %
Platelets: 282 10*3/uL (ref 150–400)
RBC: 3.6 MIL/uL — ABNORMAL LOW (ref 3.87–5.11)
RDW: 14.7 % (ref 11.5–15.5)
Smear Review: NORMAL
WBC: 12.9 10*3/uL — ABNORMAL HIGH (ref 4.0–10.5)
nRBC: 0 % (ref 0.0–0.2)

## 2021-01-25 LAB — MAGNESIUM: Magnesium: 2.2 mg/dL (ref 1.7–2.4)

## 2021-01-25 LAB — C-REACTIVE PROTEIN: CRP: 1.8 mg/dL — ABNORMAL HIGH (ref <1.0)

## 2021-01-25 LAB — RENAL FUNCTION PANEL
Albumin: 2.3 g/dL — ABNORMAL LOW (ref 3.5–5.0)
Anion gap: 12 (ref 5–15)
BUN: 87 mg/dL — ABNORMAL HIGH (ref 6–20)
CO2: 24 mmol/L (ref 22–32)
Calcium: 9.5 mg/dL (ref 8.9–10.3)
Chloride: 107 mmol/L (ref 98–111)
Creatinine, Ser: 1.68 mg/dL — ABNORMAL HIGH (ref 0.44–1.00)
GFR, Estimated: 36 mL/min — ABNORMAL LOW (ref >60)
Glucose, Bld: 249 mg/dL — ABNORMAL HIGH (ref 70–99)
Phosphorus: 6.3 mg/dL — ABNORMAL HIGH (ref 2.5–4.6)
Potassium: 4.5 mmol/L (ref 3.5–5.1)
Sodium: 143 mmol/L (ref 135–145)

## 2021-01-25 LAB — FERRITIN: Ferritin: 167 ng/mL (ref 11–307)

## 2021-01-25 LAB — FIBRIN DERIVATIVES D-DIMER (ARMC ONLY): Fibrin derivatives D-dimer (ARMC): 661.21 ng/mL (FEU) — ABNORMAL HIGH (ref 0.00–499.00)

## 2021-01-25 MED ORDER — BUMETANIDE 0.25 MG/ML IJ SOLN
1.0000 mg | Freq: Every day | INTRAMUSCULAR | Status: DC
  Administered 2021-01-25: 1 mg via INTRAVENOUS
  Filled 2021-01-25 (×2): qty 4

## 2021-01-25 MED ORDER — METHYLPREDNISOLONE SODIUM SUCC 40 MG IJ SOLR
20.0000 mg | INTRAMUSCULAR | Status: DC
  Administered 2021-01-26: 20 mg via INTRAVENOUS
  Filled 2021-01-25: qty 1

## 2021-01-25 NOTE — Progress Notes (Signed)
CRITICAL CARE NOTE 55 year old unvaccinated female admitted with acute hypoxic respiratory failure in the setting of COVID-19 pneumonia   Subjective Findings:  01/15/2021-patient with worsening resp distress s/p ETT. Has been started on lasix. Due to BMI >62 will hold proning. Patient with resp failure s/p ETT - I have updated husband Dana Bruce via phone today. 01/16/2021-patient was awake and had attempted to self extubate, sedation was increased. Midline was placed due to inadequate access no need for central line at this time. AM labs with worsening GFR, hyperglycemia. Vitals stable. UOP adequate appx 1L overnight, CXR with bilateral multifocal infiltrates. Ventilator management performed.  01/17/2021-patient has been weaned on MV from FIO2-100% to 70%, repeat ABG this afternoon. Called husband today (POA Dana Bruce) reviewed care plan and answered questions.  01/19/2021-appears volume overloaded, still with increased work of breathing when sedation is lightened 2/9FAILURE TO WEAN FROM VENT, SEVERE ALI 2/10 severe resp failure, failure to wean from vent 2/11 failed weaning trials +hyperkalemia 2/12 severe resp failure, failed weaning trials, PICC line placed +hyperkalemia 2/13 severe resp failure  Significant Diagnostic Tests:  2/3: Chest x-ray>>IMPRESSION: Diffuse bilateral ground-glass airspace opacities consistent with the patient's history of viral pneumonia. 2/8:Chest x-ray>>improved aeration from prior  Micro Data:  1/30: SARS-CoV-2 PCR>> positive 2/3: Blood culture x2>> 2/3: Sputum>> 2/3: Strep pneumo urinary antigen>>NEG 2/3: Legionella urinary antigen>>NEG 2/8 [facility] EPI started vancomycin Antimicrobials:  Remdesivir 2/3>>2/8 Levaquin 2/3>>2/9 Cefepime and Vanc 2/9>>        CC  follow up respiratory failure  SUBJECTIVE Patient remains critically ill Prognosis is guarded Failure to wean from vent  Vent Mode: PRVC FiO2 (%):  [50 %] 50 % Set Rate:   [15 bmp] 15 bmp Vt Set:  [500 mL] 500 mL PEEP:  [5 cmH20] 5 cmH20 Plateau Pressure:  [15 cmH20-23 cmH20] 17 cmH20 CBC    Component Value Date/Time   WBC 12.9 (H) 01/25/2021 0510   RBC 3.60 (L) 01/25/2021 0510   HGB 9.8 (L) 01/25/2021 0510   HCT 31.8 (L) 01/25/2021 0510   PLT 282 01/25/2021 0510   MCV 88.3 01/25/2021 0510   MCH 27.2 01/25/2021 0510   MCHC 30.8 01/25/2021 0510   RDW 14.7 01/25/2021 0510   LYMPHSABS 1.0 01/25/2021 0510   MONOABS 1.1 (H) 01/25/2021 0510   EOSABS 0.1 01/25/2021 0510   BASOSABS 0.1 01/25/2021 0510   BMP Latest Ref Rng & Units 01/25/2021 01/24/2021 01/23/2021  Glucose 70 - 99 mg/dL 249(H) 283(H) -  BUN 6 - 20 mg/dL 87(H) 74(H) -  Creatinine 0.44 - 1.00 mg/dL 1.68(H) 1.33(H) -  Sodium 135 - 145 mmol/L 143 141 -  Potassium 3.5 - 5.1 mmol/L 4.5 5.2(H) 4.9  Chloride 98 - 111 mmol/L 107 107 -  CO2 22 - 32 mmol/L 24 25 -  Calcium 8.9 - 10.3 mg/dL 9.5 9.0 -     BP (!) 134/54   Pulse (!) 106   Temp 100.22 F (37.9 C)   Resp 15   Ht 5' 7"  (1.702 m)   Wt (!) 189.6 kg   SpO2 96%   BMI 65.47 kg/m    I/O last 3 completed shifts: In: 9825.2 [I.V.:1911.8; ER/XV:4008.6; IV Piggyback:1143.8] Out: 2770 [Urine:2650; Stool:120] No intake/output data recorded.  SpO2: 96 % O2 Flow Rate (L/min): 45 L/min FiO2 (%): 50 %  Estimated body mass index is 65.47 kg/m as calculated from the following:   Height as of this encounter: 5' 7"  (1.702 m).   Weight as of this encounter: 189.6 kg.  SIGNIFICANT EVENTS   REVIEW OF SYSTEMS  PATIENT IS UNABLE TO PROVIDE COMPLETE REVIEW OF SYSTEMS DUE TO SEVERE CRITICAL ILLNESS       PHYSICAL EXAMINATION: GENERAL:critically ill appearing, +resp distress NECK: Supple.  PULMONARY: +rhonchi, +wheezing CARDIOVASCULAR: S1 and S2.  GASTROINTESTINAL: Soft, nontender, +Positive bowel sounds.  MUSCULOSKELETAL: +edema.  NEUROLOGIC: obtunded, GCS<8 SKIN:intact,warm,dry     MEDICATIONS: I have reviewed all medications  and confirmed regimen as documented   CULTURE RESULTS   Recent Results (from the past 240 hour(s))  Culture, respiratory     Status: None   Collection Time: 01/15/21  5:50 PM   Specimen: Tracheal Aspirate; Respiratory  Result Value Ref Range Status   Specimen Description   Final    TRACHEAL ASPIRATE Performed at [facility], [address], [place], [place] [address]    Special Requests   Final    NONE Performed at [facility], [place] [place], [place] [address]    Gram Stain   Final    FEW WBC PRESENT, PREDOMINANTLY PMN FEW GRAM NEGATIVE RODS FEW GRAM POSITIVE RODS RARE GRAM POSITIVE COCCI    Culture   Final    Normal respiratory flora-no Staph aureus or Pseudomonas seen Performed at [facility], [place] [address], [place], [place] [address]    Report Status 01/18/2021 FINAL  Final  Culture, respiratory (non-expectorated)     Status: None   Collection Time: 01/19/21  6:20 PM   Specimen: Tracheal Aspirate; Respiratory  Result Value Ref Range Status   Specimen Description   Final    TRACHEAL ASPIRATE Performed at [facility], [address] [place], [place] [address]    Special Requests   Final    NONE Performed at [facility], [address] [place], [place] [address]    Gram Stain   Final    ABUNDANT WBC PRESENT, PREDOMINANTLY PMN FEW GRAM POSITIVE COCCI RARE GRAM NEGATIVE RODS RARE GRAM POSITIVE RODS    Culture   Final    Normal respiratory flora-no Staph aureus or Pseudomonas seen Performed at [facility], [place] [address] [place], [place] [address]    Report Status 01/22/2021 FINAL  Final  CULTURE, BLOOD (ROUTINE X 2) w Reflex to ID Panel     Status: Abnormal   Collection Time: 01/19/21  6:45 PM   Specimen: BLOOD  Result Value Ref Range Status   Specimen Description   Final    BLOOD BLOOD LEFT HAND Performed at [facility], [address] [place], [place] [address]    Special  Requests   Final    BOTTLES DRAWN AEROBIC AND ANAEROBIC Blood Culture adequate volume Performed at [facility], [address] [place], [place] [address]    Culture  Setup Time   Final    GRAM POSITIVE COCCI IN BOTH AEROBIC AND ANAEROBIC BOTTLES CRITICAL RESULT CALLED TO, READ BACK BY AND VERIFIED WITH: BRANDON BEERS AT 5956 ON 01/20/21 BY SS    Culture (A)  Final    STAPHYLOCOCCUS EPIDERMIDIS THE SIGNIFICANCE OF ISOLATING THIS ORGANISM FROM A SINGLE SET OF BLOOD CULTURES WHEN MULTIPLE SETS ARE DRAWN IS UNCERTAIN. PLEASE NOTIFY THE MICROBIOLOGY DEPARTMENT WITHIN ONE WEEK IF SPECIATION AND SENSITIVITIES ARE REQUIRED. Performed at [facility], [place] [address], [place], [place] [address]    Report Status 01/23/2021 FINAL  Final  Blood Culture ID Panel (Reflexed)     Status: Abnormal   Collection Time: 01/19/21  6:45 PM  Result Value Ref Range Status   Enterococcus faecalis NOT DETECTED NOT DETECTED Final   Enterococcus Faecium NOT DETECTED NOT DETECTED Final   Listeria monocytogenes NOT DETECTED NOT DETECTED Final   Staphylococcus species DETECTED (A) NOT DETECTED Final    Comment: CRITICAL RESULT CALLED TO, READ BACK BY AND VERIFIED WITH: BRANDON BEERS AT 1539 ON 01/20/21 BY SS    Staphylococcus aureus (BCID) NOT DETECTED NOT DETECTED Final   Staphylococcus epidermidis DETECTED (A) NOT DETECTED Final    Comment: Methicillin (oxacillin) resistant coagulase negative staphylococcus. Possible blood culture contaminant (unless isolated from more than one blood culture draw or clinical case suggests pathogenicity). No antibiotic treatment is indicated for blood  culture contaminants. CRITICAL RESULT CALLED TO, READ BACK BY AND VERIFIED WITH: BRANDON BEERS AT 9326 ON 01/20/21 BY SS    Staphylococcus lugdunensis NOT DETECTED NOT DETECTED Final   Streptococcus species NOT DETECTED NOT DETECTED Final   Streptococcus agalactiae NOT DETECTED NOT DETECTED Final   Streptococcus  pneumoniae NOT DETECTED NOT DETECTED Final   Streptococcus pyogenes NOT DETECTED NOT DETECTED Final   A.calcoaceticus-baumannii NOT DETECTED NOT DETECTED Final   Bacteroides fragilis NOT DETECTED NOT DETECTED Final   Enterobacterales NOT DETECTED NOT DETECTED Final   Enterobacter cloacae complex NOT DETECTED NOT DETECTED Final   Escherichia coli NOT DETECTED NOT DETECTED Final   Klebsiella aerogenes NOT DETECTED NOT DETECTED Final   Klebsiella oxytoca NOT DETECTED NOT DETECTED Final   Klebsiella pneumoniae NOT DETECTED NOT DETECTED Final   Proteus species NOT DETECTED NOT DETECTED Final   Salmonella species NOT DETECTED NOT DETECTED Final   Serratia marcescens NOT DETECTED NOT DETECTED Final   Haemophilus influenzae NOT DETECTED NOT DETECTED Final   Neisseria meningitidis NOT DETECTED NOT DETECTED Final   Pseudomonas aeruginosa NOT DETECTED NOT DETECTED Final   Stenotrophomonas maltophilia NOT DETECTED NOT DETECTED Final   Candida albicans NOT DETECTED NOT DETECTED Final   Candida auris NOT DETECTED NOT DETECTED Final   Candida glabrata NOT DETECTED NOT DETECTED Final   Candida krusei NOT DETECTED NOT DETECTED Final   Candida parapsilosis NOT DETECTED NOT DETECTED Final   Candida tropicalis NOT DETECTED NOT DETECTED Final   Cryptococcus neoformans/gattii NOT DETECTED NOT DETECTED Final   Methicillin resistance mecA/C DETECTED (A) NOT DETECTED Final    Comment: CRITICAL RESULT CALLED TO, READ BACK BY AND VERIFIED WITH: BRANDON BEERS AT 1539 ON 01/20/21 BY SS Performed at [facility], [address] [place], [place] [address]   CULTURE, BLOOD (ROUTINE X 2) w Reflex to ID Panel     Status: None   Collection Time: 01/19/21  6:47 PM   Specimen: BLOOD  Result Value Ref Range Status   Specimen Description BLOOD BLOOD RIGHT HAND  Final   Special Requests   Final    BOTTLES DRAWN AEROBIC AND ANAEROBIC Blood Culture adequate volume   Culture   Final    NO GROWTH 5 DAYS Performed  at [facility], [address] [place],  [address]    Report Status 01/24/2021 FINAL  Final  MRSA PCR Screening     Status: None   Collection Time: 01/20/21  7:58 AM   Specimen: Nasal Mucosa; Nasopharyngeal  Result Value Ref Range Status   MRSA by PCR NEGATIVE NEGATIVE Final    Comment:        The GeneXpert MRSA Assay (FDA approved for NASAL specimens only), is one component of a comprehensive MRSA colonization surveillance program. It  is not intended to diagnose MRSA infection nor to guide or monitor treatment for MRSA infections. Performed at [facility], [address] [place], [place] [address]   CULTURE, BLOOD (ROUTINE X 2) w Reflex to ID Panel     Status: None (Preliminary result)   Collection Time: 01/22/21  1:02 PM   Specimen: BLOOD  Result Value Ref Range Status   Specimen Description BLOOD BLOOD LEFT HAND  Final   Special Requests   Final    BOTTLES DRAWN AEROBIC AND ANAEROBIC Blood Culture adequate volume   Culture   Final    NO GROWTH 2 DAYS Performed at [facility], [address], [place], [place] [address]    Report Status PENDING  Incomplete  CULTURE, BLOOD (ROUTINE X 2) w Reflex to ID Panel     Status: None (Preliminary result)   Collection Time: 01/22/21  1:02 PM   Specimen: BLOOD  Result Value Ref Range Status   Specimen Description BLOOD BLOOD RIGHT HAND  Final   Special Requests   Final    BOTTLES DRAWN AEROBIC AND ANAEROBIC Blood Culture adequate volume   Culture   Final    NO GROWTH 2 DAYS Performed at [facility], [address], [place],  [address]    Report Status PENDING  Incomplete         Indwelling Urinary Catheter continued, requirement due to   Reason to continue Indwelling Urinary Catheter strict Intake/Output monitoring for hemodynamic instability   Central Line/ continued, requirement due to  Reason to continue [place] of central venous pressure or other  hemodynamic parameters and poor IV access   Ventilator continued, requirement due to severe respiratory failure   Ventilator Sedation RASS 0 to -2      ASSESSMENT AND PLAN SYNOPSIS    55 year old unvaccinated female admitted with acute hypoxic respiratory failure in the setting of [id number] pneumonia complicated by acute renal failure with hyperkalemia with metabolic encephlopathy    Acute hypoxemic respiratory failure due to COVID-19 pneumonia/ARDS Mechanical ventilation via ARDS protocol, target PRVC 6 cc/kg Wean PEEP and FiO2 as able Goal plateau pressure less than 30, driving pressure less than 15 Deep sedation per PAD protocol Diuresis as tolerated based on Kidney function VAP prevention order set IV STEROIDS Therapy Follow inflammatory markers as needed with CRP Vitamin C, zinc Plan to repeat and check resp cultures as needed   Severe ACUTE Hypoxic and Hypercapnic Respiratory Failure -continue Full MV support -continue Bronchodilator Therapy -Wean Fio2 and PEEP as tolerated -will perform SAT/SBT when respiratory parameters are met -VAP/VENT bundle implementation  ACUTE DIASTOLIC CARDIAC FAILURE-  -oxygen as needed -Lasix as tolerated   Morbid obesity, possible OSA.   Will certainly impact respiratory mechanics, ventilator weaning Suspect will need to consider additional PEEP   ACUTE KIDNEY INJURY/Renal Failure -continue Foley Catheter-assess need -Avoid nephrotoxic agents -Follow urine output, BMP -Ensure adequate renal perfusion, optimize oxygenation -Renal dose medications     NEUROLOGY Acute toxic metabolic encephalopathy, need for sedation Goal RASS -2 to -3    CARDIAC ICU monitoring  INFECTIOUS DISEASE -continue antibiotics as prescribed -follow up cultures -follow up ID consultation     GI GI PROPHYLAXIS as indicated   DIET-->TF's as tolerated Constipation protocol as indicated  ENDO - will use ICU  hypoglycemic\Hyperglycemia protocol if indicated    ELECTROLYTES -follow labs as needed -replace as needed -pharmacy consultation and following   DVT/GI PRX ordered and assessed TRANSFUSIONS AS NEEDED MONITOR FSBS  I Assessed the need for Labs I Assessed the need for Foley I Assessed the need for Central Venous Line Family Discussion when available I Assessed the need for Mobilization I made an Assessment of medications to be adjusted accordingly Safety Risk assessment completed   CASE DISCUSSED IN MULTIDISCIPLINARY ROUNDS WITH ICU TEAM  Critical Care Time devoted to patient care services described in this note is 46  minutes.   Overall, patient is critically ill, prognosis is guarded.  Patient with Multiorgan failure and at high risk for cardiac arrest and death.    Dana Bruce, M.D.  [facility]  Medical Director [place] Director [facility] Cardio-Pulmonary Department

## 2021-01-25 NOTE — Consult Note (Signed)
PHARMACY CONSULT NOTE  Pharmacy Consult for Electrolyte Monitoring and Replacement   Recent Labs: Potassium (mmol/L)  Date Value  01/25/2021 4.5   Magnesium (mg/dL)  Date Value  84/21/0312 2.2   Calcium (mg/dL)  Date Value  81/18/8677 9.5   Albumin (g/dL)  Date Value  37/36/6815 2.3 (L)   Phosphorus (mg/dL)  Date Value  94/70/7615 6.3 (H)   Sodium (mmol/L)  Date Value  01/25/2021 143   Assessment: 55 yo F with COVID-19. PMH includes HTN, diabetes, and IBS. Pt intubated 2/4. Pharmacy consulted for electrolyte monitoring and replacement.  Nutrition: Tube feeds + free water flushes 200 mL q4h (1.2 L/day) Diuretics: IV Bumex 1 mg daily  Goal of Therapy:  Electrolytes WNL  Plan:  2/14 AM labs:  K 4.5  Mag 2.2  Phos 6.3  Scr 1.68 - Continue Lokelma 10g daily per nephrology - No electrolyte replacement needed today - Will follow-up electrolytes with tomorrow's AM labs  Gloriann Loan, PharmD Student 01/25/2021 1:26 PM

## 2021-01-25 NOTE — Progress Notes (Signed)
[facility]  ROUNDING NOTE   Subjective:    UOP 1623m - bumex 0.558mIV x 1 K 4.5  Objective:  Vital signs in last 24 hours:  Temp:  [98.96 F (37.2 C)-100.76 F (38.2 C)] 100.22 F (37.9 C) (02/14 1200) Pulse Rate:  [98-120] 100 (02/14 1200) Resp:  [9-27] 9 (02/14 1200) BP: (118-150)/(47-101) 133/59 (02/14 1200) SpO2:  [93 %-98 %] 95 % (02/14 1200) FiO2 (%):  [40 %-50 %] 40 % (02/14 1200) Weight:  [189.6 kg] 189.6 kg (02/14 0500)  Weight change:  Filed Weights   01/15/21 0420 01/22/21 0445 01/25/21 0500  Weight: (!) 179.6 kg (!) 183.7 kg (!) 189.6 kg    Intake/Output: I/O last 3 completed shifts: In: 9825.2 [I.V.:1911.8; NGDJ/ME:2683.4IV Piggyback:1143.8] Out: 2770 [Urine:2650; Stool:120]   Intake/Output this shift:  Total I/O In: 440 [I.V.:290; Other:100; IV Piggyback:50] Out: 880 [Urine:880]  Physical Exam: General: Critically ill  Head: ETT  Eyes: Anicteric   Neck:  trachea midline  Lungs:   PRVC FiO2 50%, tachypnic and breathing over her rate  Heart: tachycardia  Abdomen:  Soft, nontender  Extremities:  + peripheral edema.  Neurologic: Intubated and sedated  Skin: No lesions  Access: none    Basic Metabolic Panel: Recent Labs  Lab 01/21/21 0425 01/21/21 1500 01/21/21 1910 01/22/21 0316 01/22/21 1029 01/23/21 0046 01/23/21 1208 01/23/21 1650 01/24/21 0435 01/25/21 0510  NA 139 139  --  142  --  140  --   --  141 143  K 5.7* 6.0*  6.1*   < > 5.8*   < > 5.6*  5.5* 4.4 4.9 5.2* 4.5  CL 106 108  --  109  --  109  --   --  107 107  CO2 24 21*  --  23  --  23  --   --  25 24  GLUCOSE 352* 307*  --  258*  --  239*  --   --  283* 249*  BUN 76* 72*  --  67*  --  61*  --   --  74* 87*  CREATININE 1.55* 1.21*  --  1.16*  --  0.95  --   --  1.33* 1.68*  CALCIUM 8.3* 8.6*  --  8.9  --  8.9  --   --  9.0 9.5  MG 2.4  --   --  2.4  --  2.0  --   --  2.2 2.2  PHOS 4.8*  --   --  2.7  --  3.7  --   --  7.3* 6.3*   < > = values in this  interval not displayed.    Liver Function Tests: Recent Labs  Lab 01/19/21 0732 01/21/21 0425 01/22/21 0316 01/23/21 0046 01/24/21 0435 01/25/21 0510  AST 14*  --   --   --   --   --   ALT 14  --   --   --   --   --   ALKPHOS 44  --   --   --   --   --   BILITOT 0.9  --   --   --   --   --   PROT 5.9*  --   --   --   --   --   ALBUMIN 2.1* 2.1* 2.2* 2.3* 2.3* 2.3*   No results for input(s): LIPASE, AMYLASE in the last 168 hours. No results for input(s): AMMONIA in the last 168  hours.  CBC: Recent Labs  Lab 01/21/21 0425 01/22/21 0316 01/23/21 0046 01/24/21 0435 01/25/21 0510  WBC 9.9 8.7 9.6 11.7* 12.9*  NEUTROABS 7.0 5.9 6.9 9.1* 9.8*  HGB 10.9* 11.4* 10.6* 10.7* 9.8*  HCT 36.2 37.5 35.5* 35.6* 31.8*  MCV 86.8 86.6 86.2 89.4 88.3  PLT 263 286 269 321 282    Cardiac Enzymes: No results for input(s): CKTOTAL, CKMB, CKMBINDEX, TROPONINI in the last 168 hours.  BNP: Invalid input(s): POCBNP  CBG: Recent Labs  Lab 01/24/21 1924 01/24/21 2313 01/25/21 0309 01/25/21 0802 01/25/21 1141  GLUCAP 191* 148* 242* 186* 152*    Microbiology: Results for orders placed or performed during the hospital encounter of 02/05/2021  Blood Culture (routine x 2)     Status: None   Collection Time: 01/22/2021  2:56 PM   Specimen: BLOOD  Result Value Ref Range Status   Specimen Description BLOOD BLOOD LEFT ARM  Final   Special Requests   Final    BOTTLES DRAWN AEROBIC AND ANAEROBIC Blood Culture results may not be optimal due to an inadequate volume of blood received in culture bottles   Culture   Final    NO GROWTH 5 DAYS Performed at [facility], [address], [place], [place] [address]    Report Status 01/19/2021 FINAL  Final  Blood Culture (routine x 2)     Status: None   Collection Time: 01/13/2021  2:59 PM   Specimen: BLOOD  Result Value Ref Range Status   Specimen Description BLOOD BLOOD LEFT FOREARM  Final   Special Requests   Final    BOTTLES DRAWN  AEROBIC AND ANAEROBIC Blood Culture adequate volume   Culture   Final    NO GROWTH 5 DAYS Performed at [facility], [address], [place], [place] [address]    Report Status 01/19/2021 FINAL  Final  Culture, respiratory     Status: None   Collection Time: 01/15/21  5:50 PM   Specimen: Tracheal Aspirate; Respiratory  Result Value Ref Range Status   Specimen Description   Final    TRACHEAL ASPIRATE Performed at [facility], [address], [place], [place] [address]    Special Requests   Final    NONE Performed at [facility], [address] [place], [place] [address]    Gram Stain   Final    FEW WBC PRESENT, PREDOMINANTLY PMN FEW GRAM NEGATIVE RODS FEW GRAM POSITIVE RODS RARE GRAM POSITIVE COCCI    Culture   Final    Normal respiratory flora-no Staph aureus or Pseudomonas seen Performed at [facility], [place] [address], [place], [place] [address]    Report Status 01/18/2021 FINAL  Final  Culture, respiratory (non-expectorated)     Status: None   Collection Time: 01/19/21  6:20 PM   Specimen: Tracheal Aspirate; Respiratory  Result Value Ref Range Status   Specimen Description   Final    TRACHEAL ASPIRATE Performed at [facility], [address] [place], [place] [address]    Special Requests   Final    NONE Performed at [facility], [address] [place],  [address]    Gram Stain   Final    ABUNDANT WBC PRESENT, PREDOMINANTLY PMN FEW GRAM POSITIVE COCCI RARE GRAM NEGATIVE RODS RARE GRAM POSITIVE RODS    Culture   Final    Normal respiratory flora-no Staph aureus or Pseudomonas seen Performed at [facility], [place]  [address] [place], [place] [address]    Report Status 01/22/2021 FINAL  Final  CULTURE, BLOOD (ROUTINE X 2) w Reflex to ID Panel     Status: Abnormal   Collection Time: 01/19/21  6:45 PM   Specimen: BLOOD  Result Value Ref Range Status   Specimen Description   Final    BLOOD  BLOOD LEFT HAND Performed at [facility], [address] [place], [place] [address]    Special Requests   Final    BOTTLES DRAWN AEROBIC AND ANAEROBIC Blood Culture adequate volume Performed at [facility], [address] [place], [place] [address]    Culture  Setup Time   Final    GRAM POSITIVE COCCI IN BOTH AEROBIC AND ANAEROBIC BOTTLES CRITICAL RESULT CALLED TO, READ BACK BY AND VERIFIED WITH: BRANDON BEERS AT 5681 ON 01/20/21 BY SS    Culture (A)  Final    STAPHYLOCOCCUS EPIDERMIDIS THE SIGNIFICANCE OF ISOLATING THIS ORGANISM FROM A SINGLE SET OF BLOOD CULTURES WHEN MULTIPLE SETS ARE DRAWN IS UNCERTAIN. PLEASE NOTIFY THE MICROBIOLOGY DEPARTMENT WITHIN ONE WEEK IF SPECIATION AND SENSITIVITIES ARE REQUIRED. Performed at [facility], [place] [address] [place], [place] [address]    Report Status 01/23/2021 FINAL  Final  Blood Culture ID Panel (Reflexed)     Status: Abnormal   Collection Time: 01/19/21  6:45 PM  Result Value Ref Range Status   Enterococcus faecalis NOT DETECTED NOT DETECTED Final   Enterococcus Faecium NOT DETECTED NOT DETECTED Final   Listeria monocytogenes NOT DETECTED NOT DETECTED Final   Staphylococcus species DETECTED (A) NOT DETECTED Final    Comment: CRITICAL RESULT CALLED TO, READ BACK BY AND VERIFIED WITH: BRANDON BEERS AT 1539 ON 01/20/21 BY SS    Staphylococcus aureus (BCID) NOT DETECTED NOT DETECTED Final   Staphylococcus epidermidis DETECTED (A) NOT DETECTED Final    Comment: Methicillin (oxacillin) resistant coagulase negative staphylococcus. Possible blood culture contaminant (unless isolated from more than one blood culture draw or clinical case suggests pathogenicity). No antibiotic treatment is indicated for blood  culture contaminants. CRITICAL RESULT CALLED TO, READ BACK BY AND VERIFIED WITH: BRANDON BEERS AT 0017 ON 01/20/21 BY SS    Staphylococcus lugdunensis NOT DETECTED NOT DETECTED Final   Streptococcus species NOT  DETECTED NOT DETECTED Final   Streptococcus agalactiae NOT DETECTED NOT DETECTED Final   Streptococcus pneumoniae NOT DETECTED NOT DETECTED Final   Streptococcus pyogenes NOT DETECTED NOT DETECTED Final   A.calcoaceticus-baumannii NOT DETECTED NOT DETECTED Final   Bacteroides fragilis NOT DETECTED NOT DETECTED Final   Enterobacterales NOT DETECTED NOT DETECTED Final   Enterobacter cloacae complex NOT DETECTED NOT DETECTED Final   Escherichia coli NOT DETECTED NOT DETECTED Final   Klebsiella aerogenes NOT DETECTED NOT DETECTED Final   Klebsiella oxytoca NOT DETECTED NOT DETECTED Final   Klebsiella pneumoniae NOT DETECTED NOT DETECTED Final   Proteus species NOT DETECTED NOT DETECTED Final   Salmonella species NOT DETECTED NOT DETECTED Final   Serratia marcescens NOT DETECTED NOT DETECTED Final   Haemophilus influenzae NOT DETECTED NOT DETECTED Final   Neisseria meningitidis NOT DETECTED NOT DETECTED Final   Pseudomonas aeruginosa NOT DETECTED NOT DETECTED Final   Stenotrophomonas maltophilia NOT DETECTED NOT DETECTED Final   Candida albicans NOT DETECTED NOT DETECTED Final   Candida auris NOT DETECTED NOT DETECTED Final   Candida glabrata NOT DETECTED NOT DETECTED Final   Candida krusei NOT DETECTED NOT DETECTED Final   Candida parapsilosis NOT DETECTED NOT DETECTED  Final   Candida tropicalis NOT DETECTED NOT DETECTED Final   Cryptococcus neoformans/gattii NOT DETECTED NOT DETECTED Final   Methicillin resistance mecA/C DETECTED (A) NOT DETECTED Final    Comment: CRITICAL RESULT CALLED TO, READ BACK BY AND VERIFIED WITH: BRANDON BEERS AT 1539 ON 01/20/21 BY SS Performed at [facility], [address] [place], [place] [address]   CULTURE, BLOOD (ROUTINE X 2) w Reflex to ID Panel     Status: None   Collection Time: 01/19/21  6:47 PM   Specimen: BLOOD  Result Value Ref Range Status   Specimen Description BLOOD BLOOD RIGHT HAND  Final   Special Requests   Final    BOTTLES  DRAWN AEROBIC AND ANAEROBIC Blood Culture adequate volume   Culture   Final    NO GROWTH 5 DAYS Performed at [facility], [address] [place], [place] [address]    Report Status 01/24/2021 FINAL  Final  MRSA PCR Screening     Status: None   Collection Time: 01/20/21  7:58 AM   Specimen: Nasal Mucosa; Nasopharyngeal  Result Value Ref Range Status   MRSA by PCR NEGATIVE NEGATIVE Final    Comment:        The GeneXpert MRSA Assay (FDA approved for NASAL specimens only), is one component of a comprehensive MRSA colonization surveillance program. It is not intended to diagnose MRSA infection nor to guide or monitor treatment for MRSA infections. Performed at [facility], [address] [place], [place] [address]   CULTURE, BLOOD (ROUTINE X 2) w Reflex to ID Panel     Status: None (Preliminary result)   Collection Time: 01/22/21  1:02 PM   Specimen: BLOOD  Result Value Ref Range Status   Specimen Description BLOOD BLOOD LEFT HAND  Final   Special Requests   Final    BOTTLES DRAWN AEROBIC AND ANAEROBIC Blood Culture adequate volume   Culture   Final    NO GROWTH 3 DAYS Performed at [facility], [address], [place], [place] [address]    Report Status PENDING  Incomplete  CULTURE, BLOOD (ROUTINE X 2) w Reflex to ID Panel     Status: None (Preliminary result)   Collection Time: 01/22/21  1:02 PM   Specimen: BLOOD  Result Value Ref Range Status   Specimen Description BLOOD BLOOD RIGHT HAND  Final   Special Requests   Final    BOTTLES DRAWN AEROBIC AND ANAEROBIC Blood Culture adequate volume   Culture   Final    NO GROWTH 3 DAYS Performed at [facility], [address] [place], [place] [address]    Report Status PENDING  Incomplete    Coagulation Studies: No results for input(s): LABPROT, INR in the last 72 hours.  Urinalysis: No results for input(s): COLORURINE, LABSPEC, PHURINE, GLUCOSEU, HGBUR, BILIRUBINUR, KETONESUR,  PROTEINUR, UROBILINOGEN, NITRITE, LEUKOCYTESUR in the last 72 hours.  Invalid input(s): APPERANCEUR    Imaging: DG Chest Port 1 View  Result Date: 01/25/2021 CLINICAL DATA:  01/19/2021 EXAM: PORTABLE CHEST 1 VIEW COMPARISON:  Six days ago FINDINGS: Endotracheal tube with tip at the clavicular heads. The enteric tube at least reaches the stomach. Right PICC with tip at the upper right atrium. Stable cardiomegaly, low lung volumes, and pulmonary opacification. IMPRESSION: Stable hardware positioning and pulmonary opacification. Electronically Signed   By: Monte Fantasia M.D.   On: 01/25/2021 06:31     Medications:   . sodium chloride 5 mL/hr at 01/25/21 1200  .  ceFEPime (MAXIPIME) IV 2 g (01/25/21 1408)  . famotidine (PEPCID) IV Stopped (01/25/21 1054)  . feeding supplement (NEPRO CARB STEADY) 1,000 mL (01/25/21 1023)  . fentaNYL infusion INTRAVENOUS 300 mcg/hr (01/25/21 1200)  . midazolam 4 mg/hr (01/25/21 1200)   . amLODipine  10 mg Per Tube Daily  . vitamin C  1,000 mg Per Tube TID  . bumetanide (BUMEX) IV  1 mg Intravenous Daily  . chlorhexidine gluconate (MEDLINE KIT)  15 mL Mouth Rinse BID  . Chlorhexidine Gluconate Cloth  6 each Topical Daily  . enoxaparin (LOVENOX) injection  0.5 mg/kg Subcutaneous Q24H  . feeding supplement (PROSource TF)  45 mL Per Tube QID  . free water  200 mL Per Tube Q4H  . insulin aspart  0-20 Units Subcutaneous Q4H  . insulin aspart  5 Units Subcutaneous Q4H  . insulin detemir  22 Units Subcutaneous Daily  . lactulose  10 g Per Tube BID  . mouth rinse  15 mL Mouth Rinse 10 times per day  . [START ON 01/26/2021] methylPREDNISolone (SOLU-MEDROL) injection  20 mg Intravenous Q24H  . metoprolol tartrate  100 mg Per Tube BID  . polyethylene glycol  17 g Per Tube Daily  . senna-docusate  2 tablet Per Tube BID  . sodium chloride flush  10-40 mL Intracatheter Q12H  . sodium chloride flush  10-40 mL Intracatheter Q12H  . sodium chloride flush  3 mL  Intravenous Q12H  . sodium zirconium cyclosilicate  10 g Oral Daily   sodium chloride, acetaminophen (TYLENOL) oral liquid 160 mg/5 mL, albuterol, guaiFENesin-dextromethorphan, labetalol, midazolam, midazolam, ondansetron (ZOFRAN) IV, sodium chloride flush, sodium chloride flush, sodium chloride flush  Assessment/ Plan:  Ms. Dana Bruce is a 55 y.o. white female with hypertension, IBS, diabetes mellitus type II, who was admitted to [facility] on 02/07/2021 for Acute respiratory failure with hypoxia ([place]) [J96.01] Pneumonia due to COVID-19 virus [U07.1, J12.82] COVID-19 [U07.1]  Patient remains intubated and requiring mechanical ventilation. Nephrology consulted for persistent hyperkalemia  1. Hyperkalemia: with acute kidney injury. Potassium goal <5.5 - Continue lokelma  - IV bumex daily - Evaluate daily for diuretics.  - Hold home medication of telmisartan.   2. Acute kidney injury:  Nonoliguric urine output. Responding to diuretics. Worsening with diuretics  3. Hypertension: fluid driven - Continue amlodipine - IV diuretics as above.   4. 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 On metformin. Hemoglobin A1c at goal at 6.3%.  - holding metformin.    LOS: 11 Greydon Betke 2/14/20222:24 PM

## 2021-01-26 DIAGNOSIS — U071 COVID-19: Secondary | ICD-10-CM | POA: Diagnosis not present

## 2021-01-26 DIAGNOSIS — I4891 Unspecified atrial fibrillation: Secondary | ICD-10-CM

## 2021-01-26 DIAGNOSIS — J1282 Pneumonia due to coronavirus disease 2019: Secondary | ICD-10-CM | POA: Diagnosis not present

## 2021-01-26 DIAGNOSIS — J9601 Acute respiratory failure with hypoxia: Secondary | ICD-10-CM | POA: Diagnosis not present

## 2021-01-26 LAB — CBC WITH DIFFERENTIAL/PLATELET
Abs Immature Granulocytes: 0.56 10*3/uL — ABNORMAL HIGH (ref 0.00–0.07)
Basophils Absolute: 0.1 10*3/uL (ref 0.0–0.1)
Basophils Relative: 1 %
Eosinophils Absolute: 0.4 10*3/uL (ref 0.0–0.5)
Eosinophils Relative: 3 %
HCT: 30.4 % — ABNORMAL LOW (ref 36.0–46.0)
Hemoglobin: 9 g/dL — ABNORMAL LOW (ref 12.0–15.0)
Immature Granulocytes: 4 %
Lymphocytes Relative: 12 %
Lymphs Abs: 1.7 10*3/uL (ref 0.7–4.0)
MCH: 26.2 pg (ref 26.0–34.0)
MCHC: 29.6 g/dL — ABNORMAL LOW (ref 30.0–36.0)
MCV: 88.6 fL (ref 80.0–100.0)
Monocytes Absolute: 1.8 10*3/uL — ABNORMAL HIGH (ref 0.1–1.0)
Monocytes Relative: 13 %
Neutro Abs: 9.4 10*3/uL — ABNORMAL HIGH (ref 1.7–7.7)
Neutrophils Relative %: 67 %
Platelets: 308 10*3/uL (ref 150–400)
RBC: 3.43 MIL/uL — ABNORMAL LOW (ref 3.87–5.11)
RDW: 14.9 % (ref 11.5–15.5)
WBC: 14 10*3/uL — ABNORMAL HIGH (ref 4.0–10.5)
nRBC: 0.1 % (ref 0.0–0.2)

## 2021-01-26 LAB — RENAL FUNCTION PANEL
Albumin: 2.2 g/dL — ABNORMAL LOW (ref 3.5–5.0)
Anion gap: 9 (ref 5–15)
BUN: 96 mg/dL — ABNORMAL HIGH (ref 6–20)
CO2: 23 mmol/L (ref 22–32)
Calcium: 9.7 mg/dL (ref 8.9–10.3)
Chloride: 111 mmol/L (ref 98–111)
Creatinine, Ser: 1.93 mg/dL — ABNORMAL HIGH (ref 0.44–1.00)
GFR, Estimated: 30 mL/min — ABNORMAL LOW (ref >60)
Glucose, Bld: 160 mg/dL — ABNORMAL HIGH (ref 70–99)
Phosphorus: 5.2 mg/dL — ABNORMAL HIGH (ref 2.5–4.6)
Potassium: 3.7 mmol/L (ref 3.5–5.1)
Sodium: 143 mmol/L (ref 135–145)

## 2021-01-26 LAB — GLUCOSE, CAPILLARY
Glucose-Capillary: 129 mg/dL — ABNORMAL HIGH (ref 70–99)
Glucose-Capillary: 130 mg/dL — ABNORMAL HIGH (ref 70–99)
Glucose-Capillary: 164 mg/dL — ABNORMAL HIGH (ref 70–99)
Glucose-Capillary: 185 mg/dL — ABNORMAL HIGH (ref 70–99)
Glucose-Capillary: 191 mg/dL — ABNORMAL HIGH (ref 70–99)
Glucose-Capillary: 221 mg/dL — ABNORMAL HIGH (ref 70–99)
Glucose-Capillary: 227 mg/dL — ABNORMAL HIGH (ref 70–99)

## 2021-01-26 LAB — MAGNESIUM: Magnesium: 2.5 mg/dL — ABNORMAL HIGH (ref 1.7–2.4)

## 2021-01-26 LAB — C-REACTIVE PROTEIN: CRP: 1 mg/dL — ABNORMAL HIGH (ref <1.0)

## 2021-01-26 LAB — FIBRIN DERIVATIVES D-DIMER (ARMC ONLY): Fibrin derivatives D-dimer (ARMC): 629.74 ng/mL (FEU) — ABNORMAL HIGH (ref 0.00–499.00)

## 2021-01-26 LAB — FERRITIN: Ferritin: 130 ng/mL (ref 11–307)

## 2021-01-26 MED ORDER — AMIODARONE HCL IN DEXTROSE 360-4.14 MG/200ML-% IV SOLN
60.0000 mg/h | INTRAVENOUS | Status: AC
  Administered 2021-01-26 (×2): 60 mg/h via INTRAVENOUS

## 2021-01-26 MED ORDER — SODIUM CHLORIDE 0.9 % IV SOLN
2.0000 g | Freq: Two times a day (BID) | INTRAVENOUS | Status: DC
  Filled 2021-01-26: qty 2

## 2021-01-26 MED ORDER — AMIODARONE HCL IN DEXTROSE 360-4.14 MG/200ML-% IV SOLN
INTRAVENOUS | Status: AC
  Filled 2021-01-26: qty 200

## 2021-01-26 MED ORDER — ENOXAPARIN SODIUM 300 MG/3ML IJ SOLN
1.0000 mg/kg | Freq: Two times a day (BID) | INTRAMUSCULAR | Status: DC
  Administered 2021-01-26 – 2021-01-28 (×4): 190 mg via SUBCUTANEOUS
  Filled 2021-01-26 (×5): qty 1.9

## 2021-01-26 MED ORDER — AMIODARONE IV BOLUS ONLY 150 MG/100ML
INTRAVENOUS | Status: AC
  Filled 2021-01-26: qty 100

## 2021-01-26 MED ORDER — AMIODARONE HCL IN DEXTROSE 360-4.14 MG/200ML-% IV SOLN
30.0000 mg/h | INTRAVENOUS | Status: DC
  Administered 2021-01-27 – 2021-01-29 (×5): 30 mg/h via INTRAVENOUS
  Filled 2021-01-26 (×5): qty 200

## 2021-01-26 MED ORDER — AMIODARONE LOAD VIA INFUSION
150.0000 mg | Freq: Once | INTRAVENOUS | Status: AC
  Administered 2021-01-26: 150 mg via INTRAVENOUS
  Filled 2021-01-26: qty 83.34

## 2021-01-26 MED ORDER — DEXMEDETOMIDINE HCL IN NACL 400 MCG/100ML IV SOLN
0.4000 ug/kg/h | INTRAVENOUS | Status: DC
  Administered 2021-01-26: 0.4 ug/kg/h via INTRAVENOUS
  Administered 2021-01-26: 1 ug/kg/h via INTRAVENOUS
  Administered 2021-01-26 (×2): 0.8 ug/kg/h via INTRAVENOUS
  Administered 2021-01-26: 1.2 ug/kg/h via INTRAVENOUS
  Administered 2021-01-27 (×2): 0.4 ug/kg/h via INTRAVENOUS
  Administered 2021-01-27: 0.6 ug/kg/h via INTRAVENOUS
  Administered 2021-01-27: 0.4 ug/kg/h via INTRAVENOUS
  Administered 2021-01-27 – 2021-01-28 (×2): 0.6 ug/kg/h via INTRAVENOUS
  Administered 2021-01-28 (×3): 0.5 ug/kg/h via INTRAVENOUS
  Administered 2021-01-28: 0.4 ug/kg/h via INTRAVENOUS
  Administered 2021-01-29: 0.7 ug/kg/h via INTRAVENOUS
  Administered 2021-01-29 (×5): 0.6 ug/kg/h via INTRAVENOUS
  Administered 2021-01-29 – 2021-01-30 (×3): 0.7 ug/kg/h via INTRAVENOUS
  Administered 2021-01-30: 0.74 ug/kg/h via INTRAVENOUS
  Administered 2021-01-30 (×3): 0.7 ug/kg/h via INTRAVENOUS
  Administered 2021-01-31 (×5): 0.6 ug/kg/h via INTRAVENOUS
  Administered 2021-01-31: 0.7 ug/kg/h via INTRAVENOUS
  Administered 2021-01-31 – 2021-02-01 (×3): 0.6 ug/kg/h via INTRAVENOUS
  Administered 2021-02-01: 0.63 ug/kg/h via INTRAVENOUS
  Administered 2021-02-01 (×2): 0.6 ug/kg/h via INTRAVENOUS
  Filled 2021-01-26 (×44): qty 100

## 2021-01-26 MED ORDER — ACETAMINOPHEN 325 MG PO TABS
325.0000 mg | ORAL_TABLET | Freq: Four times a day (QID) | ORAL | Status: DC | PRN
  Administered 2021-01-26 – 2021-01-31 (×2): 325 mg via ORAL
  Filled 2021-01-26 (×2): qty 1

## 2021-01-26 NOTE — Progress Notes (Signed)
During sedation adjustments patient developed A-Fib RVR, no PMHx of A-fib on file.  - amiodarone bolus & drip - increase lovenox for systemic coverage - continuous cardiac monitoring   Betsey Holiday, AGACNP-BC Acute Care Nurse Practitioner [facility]   [phone] / [phone] Please see Amion for pager details.

## 2021-01-26 NOTE — Progress Notes (Signed)
CRITICAL CARE NOTE 55 year old unvaccinated female admitted with acute hypoxic respiratory failure in the setting of COVID-19 pneumonia   Subjective Findings: 01/15/2021-patient with worsening resp distress s/p ETT. Has been started on lasix. Due to BMI >62 will hold proning. Patient with resp failure s/p ETT - I have updated husband [name] via phone today. 01/16/2021-patient was awake and had attempted to self extubate, sedation was increased. Midline was placed due to inadequate access no need for central line at this time. AM labs with worsening GFR, hyperglycemia. Vitals stable. UOP adequate appx 1L overnight, CXR with bilateral multifocal infiltrates. Ventilator management performed.  01/17/2021-patient has been weaned on MV from FIO2-100% to 70%, repeat ABG this afternoon. Called husband today (POA Jonisha Kindig) reviewed care plan and answered questions.  01/19/2021-appears volume overloaded, still with increased work of breathing when sedation is lightened 2/9FAILURE TO WEAN FROM VENT, SEVERE ALI 2/10 severe resp failure, failure to wean from vent 2/11 failed weaning trials +hyperkalemia 2/12 severe resp failure, failed weaning trials, PICC line placed +hyperkalemia 2/13 severe resp failure  Significant Diagnostic Tests:  2/3: Chest x-ray>>IMPRESSION: Diffuse bilateral ground-glass airspace opacities consistent with the patient's history of viral pneumonia. 2/8:Chest x-ray>>improved aeration from prior  Micro Data:  1/30: SARS-CoV-2 PCR>> positive 2/3: Blood culture x2>> 2/3: Sputum>> 2/3: Strep pneumo urinary antigen>>NEG 2/3: Legionella urinary antigen>>NEG 2/8 [facility] EPI started vancomycin Antimicrobials:  Remdesivir 2/3>>2/8 Levaquin 2/3>>2/9 Cefepime and Vanc 2/9>>      CC  follow up respiratory failure  SUBJECTIVE Patient remains critically ill Prognosis is guarded  Vent Mode: PRVC FiO2 (%):  [40 %] 40 % Set Rate:  [15 bmp] 15 bmp Vt Set:  [500 mL]  500 mL PEEP:  [5 cmH20] 5 cmH20 Plateau Pressure:  [16 cmH20-18 cmH20] 16 cmH20  CBC    Component Value Date/Time   WBC 14.0 (H) 01/26/2021 0436   RBC 3.43 (L) 01/26/2021 0436   HGB 9.0 (L) 01/26/2021 0436   HCT 30.4 (L) 01/26/2021 0436   PLT 308 01/26/2021 0436   MCV 88.6 01/26/2021 0436   MCH 26.2 01/26/2021 0436   MCHC 29.6 (L) 01/26/2021 0436   RDW 14.9 01/26/2021 0436   LYMPHSABS 1.7 01/26/2021 0436   MONOABS 1.8 (H) 01/26/2021 0436   EOSABS 0.4 01/26/2021 0436   BASOSABS 0.1 01/26/2021 0436   BMP Latest Ref Rng & Units 01/26/2021 01/25/2021 01/24/2021  Glucose 70 - 99 mg/dL 160(H) 249(H) 283(H)  BUN 6 - 20 mg/dL 96(H) 87(H) 74(H)  Creatinine 0.44 - 1.00 mg/dL 1.93(H) 1.68(H) 1.33(H)  Sodium 135 - 145 mmol/L 143 143 141  Potassium 3.5 - 5.1 mmol/L 3.7 4.5 5.2(H)  Chloride 98 - 111 mmol/L 111 107 107  CO2 22 - 32 mmol/L _0 Calcium 8.9 - 10.3 mg/dL 9.7 9.5 9.0    BP (!) 123/52   Pulse (!) 113   Temp (!) 100.58 F (38.1 C)   Resp 14   Ht 5' 7" (1.702 m)   Wt (!) 189.6 kg   SpO2 92%   BMI 65.47 kg/m    I/O last 3 completed shifts: In: 7059.9 [I.V.:1514.1; Other:300; QI/HK:7425.9; IV Piggyback:603.3] Out: 5638 [Urine:2955; Stool:700] No intake/output data recorded.  SpO2: 92 % O2 Flow Rate (L/min): 45 L/min FiO2 (%): 40 %  Estimated body mass index is 65.47 kg/m as calculated from the following:   Height as of this encounter: 5' 7" (1.702 m).   Weight as of this encounter: 189.6 kg.  SIGNIFICANT EVENTS  REVIEW OF SYSTEMS  PATIENT IS UNABLE TO PROVIDE COMPLETE REVIEW OF SYSTEMS DUE TO SEVERE CRITICAL ILLNESS       PHYSICAL EXAMINATION: GENERAL:critically ill appearing, +resp distress NECK: Supple.  PULMONARY: +rhonchi, +wheezing CARDIOVASCULAR: S1 and S2.  GASTROINTESTINAL: Soft, nontender, +Positive bowel sounds.  MUSCULOSKELETAL: No swelling, clubbing, or edema.  NEUROLOGIC: obtunded, GCS<8 SKIN:intact,warm,dry     MEDICATIONS: I  have reviewed all medications and confirmed regimen as documented   CULTURE RESULTS   Recent Results (from the past 240 hour(s))  Culture, respiratory (non-expectorated)     Status: None   Collection Time: 01/19/21  6:20 PM   Specimen: Tracheal Aspirate; Respiratory  Result Value Ref Range Status   Specimen Description   Final    TRACHEAL ASPIRATE Performed at [facility], [address], [place], [place] [address]    Special Requests   Final    NONE Performed at [facility], [address] [place], [place] [address]    Gram Stain   Final    ABUNDANT WBC PRESENT, PREDOMINANTLY PMN FEW GRAM POSITIVE COCCI RARE GRAM NEGATIVE RODS RARE GRAM POSITIVE RODS    Culture   Final    Normal respiratory flora-no Staph aureus or Pseudomonas seen Performed at [facility], [place] [address], [place], [place] [address]    Report Status 01/22/2021 FINAL  Final  CULTURE, BLOOD (ROUTINE X 2) w Reflex to ID Panel     Status: Abnormal   Collection Time: 01/19/21  6:45 PM   Specimen: BLOOD  Result Value Ref Range Status   Specimen Description   Final    BLOOD BLOOD LEFT HAND Performed at [facility], [address] [place], [place] [address]    Special Requests   Final    BOTTLES DRAWN AEROBIC AND ANAEROBIC Blood Culture adequate volume Performed at [facility], [address] [place], [place] [address]    Culture  Setup Time   Final    GRAM POSITIVE COCCI IN BOTH AEROBIC AND ANAEROBIC BOTTLES CRITICAL RESULT CALLED TO, READ BACK BY AND VERIFIED WITH: BRANDON BEERS AT 1941 ON 01/20/21 BY SS    Culture (A)  Final    STAPHYLOCOCCUS EPIDERMIDIS THE SIGNIFICANCE OF ISOLATING THIS ORGANISM FROM A SINGLE SET OF BLOOD CULTURES WHEN MULTIPLE SETS ARE DRAWN IS UNCERTAIN. PLEASE NOTIFY THE MICROBIOLOGY DEPARTMENT WITHIN ONE WEEK IF SPECIATION AND SENSITIVITIES ARE REQUIRED. Performed at [facility], [place] [address], [place], [place] [address]     Report Status 01/23/2021 FINAL  Final  Blood Culture ID Panel (Reflexed)     Status: Abnormal   Collection Time: 01/19/21  6:45 PM  Result Value Ref Range Status   Enterococcus faecalis NOT DETECTED NOT DETECTED Final   Enterococcus Faecium NOT DETECTED NOT DETECTED Final   Listeria monocytogenes NOT DETECTED NOT DETECTED Final   Staphylococcus species DETECTED (A) NOT DETECTED Final    Comment: CRITICAL RESULT CALLED TO, READ BACK BY AND VERIFIED WITH: BRANDON BEERS AT 1539 ON 01/20/21 BY SS    Staphylococcus aureus (BCID) NOT DETECTED NOT DETECTED Final   Staphylococcus epidermidis DETECTED (A) NOT DETECTED Final    Comment: Methicillin (oxacillin) resistant coagulase negative staphylococcus. Possible blood culture contaminant (unless isolated from more than one blood culture draw or clinical case suggests pathogenicity). No antibiotic treatment is indicated for blood  culture contaminants. CRITICAL RESULT CALLED TO, READ BACK BY AND VERIFIED WITH: BRANDON BEERS AT 1539 ON 01/20/21 BY SS  Staphylococcus lugdunensis NOT DETECTED NOT DETECTED Final   Streptococcus species NOT DETECTED NOT DETECTED Final   Streptococcus agalactiae NOT DETECTED NOT DETECTED Final   Streptococcus pneumoniae NOT DETECTED NOT DETECTED Final   Streptococcus pyogenes NOT DETECTED NOT DETECTED Final   A.calcoaceticus-baumannii NOT DETECTED NOT DETECTED Final   Bacteroides fragilis NOT DETECTED NOT DETECTED Final   Enterobacterales NOT DETECTED NOT DETECTED Final   Enterobacter cloacae complex NOT DETECTED NOT DETECTED Final   Escherichia coli NOT DETECTED NOT DETECTED Final   Klebsiella aerogenes NOT DETECTED NOT DETECTED Final   Klebsiella oxytoca NOT DETECTED NOT DETECTED Final   Klebsiella pneumoniae NOT DETECTED NOT DETECTED Final   Proteus species NOT DETECTED NOT DETECTED Final   Salmonella species NOT DETECTED NOT DETECTED Final   Serratia marcescens NOT DETECTED NOT DETECTED Final   Haemophilus  influenzae NOT DETECTED NOT DETECTED Final   Neisseria meningitidis NOT DETECTED NOT DETECTED Final   Pseudomonas aeruginosa NOT DETECTED NOT DETECTED Final   Stenotrophomonas maltophilia NOT DETECTED NOT DETECTED Final   Candida albicans NOT DETECTED NOT DETECTED Final   Candida auris NOT DETECTED NOT DETECTED Final   Candida glabrata NOT DETECTED NOT DETECTED Final   Candida krusei NOT DETECTED NOT DETECTED Final   Candida parapsilosis NOT DETECTED NOT DETECTED Final   Candida tropicalis NOT DETECTED NOT DETECTED Final   Cryptococcus neoformans/gattii NOT DETECTED NOT DETECTED Final   Methicillin resistance mecA/C DETECTED (A) NOT DETECTED Final    Comment: CRITICAL RESULT CALLED TO, READ BACK BY AND VERIFIED WITH: BRANDON BEERS AT 1539 ON 01/20/21 BY SS Performed at [facility], [address] [place], [place] [address]   CULTURE, BLOOD (ROUTINE X 2) w Reflex to ID Panel     Status: None   Collection Time: 01/19/21  6:47 PM   Specimen: BLOOD  Result Value Ref Range Status   Specimen Description BLOOD BLOOD RIGHT HAND  Final   Special Requests   Final    BOTTLES DRAWN AEROBIC AND ANAEROBIC Blood Culture adequate volume   Culture   Final    NO GROWTH 5 DAYS Performed at [facility], [address] [place], [place] [address]    Report Status 01/24/2021 FINAL  Final  MRSA PCR Screening     Status: None   Collection Time: 01/20/21  7:58 AM   Specimen: Nasal Mucosa; Nasopharyngeal  Result Value Ref Range Status   MRSA by PCR NEGATIVE NEGATIVE Final    Comment:        The GeneXpert MRSA Assay (FDA approved for NASAL specimens only), is one component of a comprehensive MRSA colonization surveillance program. It is not intended to diagnose MRSA infection nor to guide or monitor treatment for MRSA infections. Performed at [facility], [address] [place], [place] [address]   CULTURE, BLOOD (ROUTINE X 2) w Reflex to ID Panel     Status: None  (Preliminary result)   Collection Time: 01/22/21  1:02 PM   Specimen: BLOOD  Result Value Ref Range Status   Specimen Description BLOOD BLOOD LEFT HAND  Final   Special Requests   Final    BOTTLES DRAWN AEROBIC AND ANAEROBIC Blood Culture adequate volume   Culture   Final    NO GROWTH 3 DAYS Performed at [facility], [address] [place], [place] [address]    Report Status PENDING  Incomplete  CULTURE, BLOOD (ROUTINE X 2) w Reflex to ID Panel     Status: None (  Preliminary result)   Collection Time: 01/22/21  1:02 PM   Specimen: BLOOD  Result Value Ref Range Status   Specimen Description BLOOD BLOOD RIGHT HAND  Final   Special Requests   Final    BOTTLES DRAWN AEROBIC AND ANAEROBIC Blood Culture adequate volume   Culture   Final    NO GROWTH 3 DAYS Performed at [facility], [address], [place], [place] [address]    Report Status PENDING  Incomplete  Culture, respiratory (non-expectorated)     Status: None (Preliminary result)   Collection Time: 01/25/21 10:30 AM   Specimen: Tracheal Aspirate; Respiratory  Result Value Ref Range Status   Specimen Description   Final    TRACHEAL ASPIRATE Performed at [facility], [address] [place], [place] [address]    Special Requests   Final    NONE Performed at [facility], [address] [place], [place] [address]    Gram Stain   Final    MODERATE WBC PRESENT,BOTH PMN AND MONONUCLEAR NO ORGANISMS SEEN Performed at [facility], [place] [address] [place], [place] [address]    Culture PENDING  Incomplete   Report Status PENDING  Incomplete          IMAGING    No results found.   Nutrition Status: Nutrition Problem: Inadequate oral intake Etiology: inability to eat (pt sedated and ventilated) Signs/Symptoms: NPO status       Indwelling Urinary Catheter continued, requirement due to   Reason to continue Indwelling Urinary Catheter strict Intake/Output monitoring  for hemodynamic instability   Central Line/ continued, requirement due to  Reason to continue [place] of central venous pressure or other hemodynamic parameters and poor IV access   Ventilator continued, requirement due to severe respiratory failure   Ventilator Sedation RASS 0 to -2      ASSESSMENT AND PLAN SYNOPSIS   55 year old unvaccinated female admitted with acute hypoxic respiratory failure in the setting of [id number] pneumonia complicated by acute renal failure with hyperkalemia with metabolic encephlopathy   Acute hypoxemic respiratory failure due to COVID-19 pneumonia/ARDS Mechanical ventilation via ARDS protocol, target PRVC 6 cc/kg Wean PEEP and FiO2 as able Goal plateau pressure less than 30, driving pressure less than 15 Paralytics if necessary for vent synchrony, gas exchange Deep sedation per PAD protocol Diuresis as tolerated based on Kidney function VAP prevention order set IV STEROIDS Therapy Follow inflammatory markers as needed with CRP Vitamin C, zinc Plan to repeat and check resp cultures as needed   Severe ACUTE Hypoxic and Hypercapnic Respiratory Failure -continue Full MV support -continue Bronchodilator Therapy -Wean Fio2 and PEEP as tolerated -will perform SAT/SBT when respiratory parameters are met -VAP/VENT bundle implementation  ACUTE DIASTOLIC CARDIAC FAILURE-  -oxygen as needed -Lasix as tolerated   Morbid obesity, possible OSA.   Will certainly impact respiratory mechanics, ventilator weaning Suspect will need to consider additional PEEP   ACUTE KIDNEY INJURY/Renal Failure -continue Foley Catheter-assess need -Avoid nephrotoxic agents -Follow urine output, BMP -Ensure adequate renal perfusion, optimize oxygenation -Renal dose medications     NEUROLOGY Acute toxic metabolic encephalopathy, need for sedation Goal RASS -2 to -3 Follow up Nephrology consultation   CARDIAC ICU monitoring  ID -continue IV  abx as prescibed -follow up cultures  GI GI PROPHYLAXIS as indicated   DIET-->TF's as tolerated Constipation protocol as indicated  ENDO - will use ICU hypoglycemic\Hyperglycemia protocol if indicated    ELECTROLYTES -follow labs as needed -replace as  needed -pharmacy consultation and following   DVT/GI PRX ordered and assessed TRANSFUSIONS AS NEEDED MONITOR FSBS I Assessed the need for Labs I Assessed the need for Foley I Assessed the need for Central Venous Line Family Discussion when available I Assessed the need for Mobilization I made an Assessment of medications to be adjusted accordingly Safety Risk assessment completed   CASE DISCUSSED IN MULTIDISCIPLINARY ROUNDS WITH ICU TEAM  Critical Care Time devoted to patient care services described in this note is 45   minutes.   Overall, patient is critically ill, prognosis is guarded.  Patient with Multiorgan failure and at high risk for cardiac arrest and death.   Failure to wean from [place], M.D.  [facility]  Medical Director [place] Director [facility] Cardio-Pulmonary Department

## 2021-01-26 NOTE — Consult Note (Signed)
NAME: Dana Bruce  DOB: [birth year]  MRN: [id number]  Date/Time: 01/26/2021 4:28 PM  REQUESTING PROVIDER: Dr. Mortimer Fries Subjective:  REASON FOR CONSULT: Fever ?No history available from patient as she is intubated.  Chart reviewed. Dana Bruce is a 55 y.o. female with a history of diabetes mellitus, morbid obesity presented to the ED on 01/31/2021 with shortness of breath.  Patient tested positive for Covid on 01/10/2021.  She started having symptoms of shortness of breath following that.  In the ED initial room air saturation was 50% and she was put on heated high flow nasal cannula with sats 90%.  She was also complaining of cough and pain On this side of the chest while coughing. In the ED vitals where BP of 124/85, heart rate 120, respiratory rate 36, temperature 97.4, sats 55% initially and 90 following high flow oxygen.  Weight of 450 pounds. WBC 12.9, glucose 261, creatinine 1.03, albumin 2.8, LDH 229, fibrinogen 623, triglycerides 145.  Blood culture sent. Was given levaquin, remdisivir intubated on 01/15/21 She attempted to self extubate on 2/5 and sedation increased As she is having fever I am asked to see patient    Past Medical History:  Diagnosis Date  . Arthritis    hips  . Diabetes mellitus without complication ([place])    type 2  . Family history of adverse reaction to anesthesia    mother has ponv  . Goiter    followed by dr gerkin q year  . Hypertension   . IBS (irritable bowel syndrome)   . PONV (postoperative nausea and vomiting)    ponv 1 day after last dental surgery    Past Surgical History:  Procedure Laterality Date  . ABDOMINAL HYSTERECTOMY     partial  . CHOLECYSTECTOMY    . COLONOSCOPY WITH PROPOFOL N/A 05/17/2017   Procedure: COLONOSCOPY WITH PROPOFOL;  Surgeon: Arta Silence, MD;  Location: [facility];  Service: Endoscopy;  Laterality: N/A;  . colonscopy  1995 or 1996   with barium enema  . DENTAL SURGERY    . DILATION AND CURETTAGE OF UTERUS     x 2   . surgery for ecotic pregnancy      Social History   Socioeconomic History  . Marital status: Married    Spouse name: Not on file  . Number of children: Not on file  . Years of education: Not on file  . Highest education level: Not on file  Occupational History  . Not on file  Tobacco Use  . Smoking status: Never Smoker  . Smokeless tobacco: Never Used  Vaping Use  . Vaping Use: Never used  Substance and Sexual Activity  . Alcohol use: No  . Drug use: No  . Sexual activity: Not on file  Other Topics Concern  . Not on file  Social History Narrative  . Not on file   Social Determinants of Health   Financial Resource Strain: Not on file  Food Insecurity: Not on file  Transportation Needs: Not on file  Physical Activity: Not on file  Stress: Not on file  Social Connections: Not on file  Intimate Partner Violence: Not on file    No family history on file. Allergies  Allergen Reactions  . Lasix [Furosemide] Itching  . Penicillins Hives and Itching    Has patient had a PCN reaction causing immediate rash, facial/tongue/throat swelling, SOB or lightheadedness with hypotension: yes Has patient had a PCN reaction causing severe rash involving mucus membranes or skin necrosis:  no Has patient had a PCN reaction that required hospitalization No Has patient had a PCN reaction occurring within the last 10 years: No If all of the above answers are "NO", then may proceed with Cephalosporin use.   [place] Flavor [Flavoring Agent] Itching, Nausea And Vomiting and Rash    Breaks out in places where she touches strawberries     ? Current Facility-Administered Medications  Medication Dose Route Frequency Provider Last Rate Last Admin  . 0.9 %  sodium chloride infusion  250 mL Intravenous PRN Flora Lipps, MD 5 mL/hr at 01/26/21 1200 Infusion Verify at 01/26/21 1200  . acetaminophen (TYLENOL) 160 MG/5ML solution 650 mg  650 mg Per Tube Q4H PRN Flora Lipps, MD   650 mg at  01/26/21 1539  . albuterol (VENTOLIN HFA) 108 (90 Base) MCG/ACT inhaler 2 puff  2 puff Inhalation Q2H PRN Vanessa , MD   2 puff at 02/08/2021 1554  . amiodarone (NEXTERONE PREMIX) 360-4.14 MG/200ML-% (1.8 mg/mL) IV infusion  60 mg/hr Intravenous Continuous Rust-Chester, Britton L, NP 33.3 mL/hr at 01/26/21 1249 60 mg/hr at 01/26/21 1249   Followed by  . amiodarone (NEXTERONE PREMIX) 360-4.14 MG/200ML-% (1.8 mg/mL) IV infusion  30 mg/hr Intravenous Continuous Rust-Chester, Britton L, NP      . amLODipine (NORVASC) tablet 10 mg  10 mg Per Tube Daily Rust-Chester, Britton L, NP   10 mg at 01/26/21 0854  . ascorbic acid (VITAMIN C) tablet 1,000 mg  1,000 mg Per Tube TID Flora Lipps, MD   1,000 mg at 01/26/21 1540  . chlorhexidine gluconate (MEDLINE KIT) (PERIDEX) 0.12 % solution 15 mL  15 mL Mouth Rinse BID Ottie Glazier, MD   15 mL at 01/26/21 0855  . Chlorhexidine Gluconate Cloth 2 % PADS 6 each  6 each Topical Daily Flora Lipps, MD   6 each at 01/24/21 1020  . dexmedetomidine (PRECEDEX) 400 MCG/100ML (4 mcg/mL) infusion  0.4-1.2 mcg/kg/hr Intravenous Titrated Flora Lipps, MD 47.4 mL/hr at 01/26/21 1625 1 mcg/kg/hr at 01/26/21 1625  . enoxaparin (LOVENOX) 100 mg/mL injection 190 mg  1 mg/kg Subcutaneous Q12H Benita Gutter, RPH      . famotidine (PEPCID) IVPB 20 mg premix  20 mg Intravenous Q12H Flora Lipps, MD   Stopped at 01/26/21 4010  . feeding supplement (NEPRO CARB STEADY) liquid 1,000 mL  1,000 mL Per Tube Continuous Flora Lipps, MD 55 mL/hr at 01/26/21 0914 1,000 mL at 01/26/21 0914  . feeding supplement (PROSource TF) liquid 45 mL  45 mL Per Tube QID Flora Lipps, MD   45 mL at 01/26/21 1356  . fentaNYL 2530mg in NS 2533m(1041mml) infusion-PREMIX  0-400 mcg/hr Intravenous Continuous GruDallie PilesPH 27.5 mL/hr at 01/26/21 1431 275 mcg/hr at 01/26/21 1431  . free water 200 mL  200 mL Per Tube Q4H GonTyler PitaD   200 mL at 01/26/21 1540  . guaiFENesin-dextromethorphan  (ROBITUSSIN DM) 100-10 MG/5ML syrup 5 mL  5 mL Oral Q4H PRN FunVanessa DurhamD   5 mL at 01/19/2021 1607  . insulin aspart (novoLOG) injection 0-20 Units  0-20 Units Subcutaneous Q4H KeeDarel Hong NP   7 Units at 01/26/21 1539  . insulin aspart (novoLOG) injection 5 Units  5 Units Subcutaneous Q4H KasFlora LippsD   5 Units at 01/26/21 1539  . insulin detemir (LEVEMIR) injection 22 Units  22 Units Subcutaneous Daily Rust-Chester, Britton L, NP   22 Units at 01/26/21 0853  . labetalol (  NORMODYNE) injection 10 mg  10 mg Intravenous Q2H PRN Tyler Pita, MD   10 mg at 01/22/21 1313  . lactulose (CHRONULAC) 10 GM/15ML solution 10 g  10 g Per Tube BID Flora Lipps, MD   10 g at 01/26/21 0853  . MEDLINE mouth rinse  15 mL Mouth Rinse 10 times per day Ottie Glazier, MD   15 mL at 01/26/21 1540  . metoprolol tartrate (LOPRESSOR) tablet 100 mg  100 mg Per Tube BID Rust-Chester, Britton L, NP   100 mg at 01/26/21 0854  . midazolam (VERSED) 50 mg/50 mL (1 mg/mL) premix infusion  0.5-10 mg/hr Intravenous Continuous Flora Lipps, MD   Stopped at 01/26/21 1625  . midazolam (VERSED) injection 2 mg  2 mg Intravenous Q2H PRN Rust-Chester, Britton L, NP   2 mg at 01/24/21 0517  . ondansetron (ZOFRAN) injection 4 mg  4 mg Intravenous Q6H PRN Flora Lipps, MD      . polyethylene glycol (MIRALAX / GLYCOLAX) packet 17 g  17 g Per Tube Daily Rust-Chester, Britton L, NP   17 g at 01/26/21 0854  . senna-docusate (Senokot-S) tablet 2 tablet  2 tablet Per Tube BID Tyler Pita, MD   2 tablet at 01/26/21 0854  . sodium chloride flush (NS) 0.9 % injection 10-40 mL  10-40 mL Intracatheter Q12H Ottie Glazier, MD   10 mL at 01/25/21 1025  . sodium chloride flush (NS) 0.9 % injection 10-40 mL  10-40 mL Intracatheter PRN Ottie Glazier, MD      . sodium chloride flush (NS) 0.9 % injection 10-40 mL  10-40 mL Intracatheter Q12H Flora Lipps, MD   20 mL at 01/26/21 0856  . sodium chloride flush (NS) 0.9 % injection  10-40 mL  10-40 mL Intracatheter PRN Flora Lipps, MD      . sodium chloride flush (NS) 0.9 % injection 3 mL  3 mL Intravenous Q12H Flora Lipps, MD   3 mL at 01/25/21 2115  . sodium chloride flush (NS) 0.9 % injection 3 mL  3 mL Intravenous PRN Flora Lipps, MD   3 mL at 01/16/21 2159     Abtx:  Anti-infectives (From admission, onward)   Start     Dose/Rate Route Frequency Ordered Stop   01/26/21 2200  ceFEPIme (MAXIPIME) 2 g in sodium chloride 0.9 % 100 mL IVPB  Status:  Discontinued        2 g 200 mL/hr over 30 Minutes Intravenous Every 12 hours 01/26/21 0824 01/26/21 1003   01/25/21 1400  vancomycin (VANCOREADY) IVPB 2000 mg/400 mL  Status:  Discontinued        2,000 mg 200 mL/hr over 120 Minutes Intravenous Every 24 hours 01/24/21 1128 01/25/21 1008   01/23/21 2200  vancomycin (VANCOREADY) IVPB 1250 mg/250 mL  Status:  Discontinued        1,250 mg 166.7 mL/hr over 90 Minutes Intravenous Every 12 hours 01/23/21 0825 01/24/21 1128   01/23/21 0500  vancomycin (VANCOREADY) IVPB 2000 mg/400 mL  Status:  Discontinued        2,000 mg 200 mL/hr over 120 Minutes Intravenous Every 24 hours 01/22/21 1201 01/23/21 0825   01/22/21 0500  vancomycin (VANCOREADY) IVPB 1500 mg/300 mL  Status:  Discontinued        1,500 mg 150 mL/hr over 120 Minutes Intravenous Every 24 hours 01/21/21 1515 01/22/21 1201   01/20/21 1700  vancomycin (VANCOREADY) IVPB 1250 mg/250 mL  Status:  Discontinued  1,250 mg 166.7 mL/hr over 90 Minutes Intravenous Every 12 hours 01/20/21 1606 01/21/21 1515   01/20/21 1400  vancomycin (VANCOREADY) IVPB 1250 mg/250 mL  Status:  Discontinued        1,250 mg 166.7 mL/hr over 90 Minutes Intravenous Every 12 hours 01/19/21 1852 01/20/21 1004   01/20/21 1400  ceFEPIme (MAXIPIME) 2 g in sodium chloride 0.9 % 100 mL IVPB  Status:  Discontinued        2 g 200 mL/hr over 30 Minutes Intravenous Every 8 hours 01/20/21 1004 01/26/21 0824   01/19/21 1945  vancomycin (VANCOREADY) IVPB  2000 mg/400 mL        2,000 mg 200 mL/hr over 120 Minutes Intravenous  Once 01/19/21 1852 01/20/21 0355   01/15/21 1800  levofloxacin (LEVAQUIN) IVPB 750 mg        750 mg 100 mL/hr over 90 Minutes Intravenous Every 24 hours 01/15/21 1013 01/19/21 1831   01/15/21 1000  remdesivir 100 mg in sodium chloride 0.9 % 100 mL IVPB       "Followed by" Linked Group Details   100 mg 200 mL/hr over 30 Minutes Intravenous Daily 01/24/2021 1504 01/18/21 1108   01/12/2021 1800  levofloxacin (LEVAQUIN) IVPB 750 mg  Status:  Discontinued        750 mg 100 mL/hr over 90 Minutes Intravenous Every 24 hours 01/13/2021 1638 01/15/21 1013   01/27/2021 1600  remdesivir 200 mg in sodium chloride 0.9% 250 mL IVPB       "Followed by" Linked Group Details   200 mg 580 mL/hr over 30 Minutes Intravenous Once 01/19/2021 1504 01/17/2021 1621      REVIEW OF SYSTEMS:  NA  LDA PICC 01/23/21 NG/OD 01/15/21 Foley-01/15/21 ETT 01/15/21  Objective:  VITALS:  BP 137/69   Pulse (!) 117   Temp (!) 102.38 F (39.1 C)   Resp 11   Ht 5' 7"  (1.702 m)   Wt (!) 189.6 kg   SpO2 96%   BMI 65.47 kg/m  PHYSICAL EXAM:  General:intubaed and sedated Morbid obesity OG tube  Back: did not examine Lungs:b/l air entry- crepts Heart: tachycardia Abdomen: pannus Extremities: atraumatic, no cyanosis. No edema. No clubbing Skin: No rashes or lesions. Or bruising Lymph: Cervical, supraclavicular normal. Neurologic: cannot assess Pertinent Labs Lab Results CBC    Component Value Date/Time   WBC 14.0 (H) 01/26/2021 0436   RBC 3.43 (L) 01/26/2021 0436   HGB 9.0 (L) 01/26/2021 0436   HCT 30.4 (L) 01/26/2021 0436   PLT 308 01/26/2021 0436   MCV 88.6 01/26/2021 0436   MCH 26.2 01/26/2021 0436   MCHC 29.6 (L) 01/26/2021 0436   RDW 14.9 01/26/2021 0436   LYMPHSABS 1.7 01/26/2021 0436   MONOABS 1.8 (H) 01/26/2021 0436   EOSABS 0.4 01/26/2021 0436   BASOSABS 0.1 01/26/2021 0436    CMP Latest Ref Rng & Units 01/26/2021 01/25/2021  01/24/2021  Glucose 70 - 99 mg/dL 160(H) 249(H) 283(H)  BUN 6 - 20 mg/dL 96(H) 87(H) 74(H)  Creatinine 0.44 - 1.00 mg/dL 1.93(H) 1.68(H) 1.33(H)  Sodium 135 - 145 mmol/L 143 143 141  Potassium 3.5 - 5.1 mmol/L 3.7 4.5 5.2(H)  Chloride 98 - 111 mmol/L 111 107 107  CO2 22 - 32 mmol/L 23 24 25   Calcium 8.9 - 10.3 mg/dL 9.7 9.5 9.0  Total Protein 6.5 - 8.1 g/dL - - -  Total Bilirubin 0.3 - 1.2 mg/dL - - -  Alkaline Phos 38 - 126 U/L - - -  AST 15 - 41 U/L - - -  ALT 0 - 44 U/L - - -      Microbiology:  01/31/2021 BC - NG 01/19/21 BC stap epi 1 set 2/11 BC- NG 2/14 tracheal aspirate 2/9 MRSa nares neg  Recent Results (from the past 240 hour(s))  Culture, respiratory (non-expectorated)     Status: None   Collection Time: 01/19/21  6:20 PM   Specimen: Tracheal Aspirate; Respiratory  Result Value Ref Range Status   Specimen Description   Final    TRACHEAL ASPIRATE Performed at [facility], [address], [place], [place] [address]    Special Requests   Final    NONE Performed at [facility], [address] [place], [place] [address]    Gram Stain   Final    ABUNDANT WBC PRESENT, PREDOMINANTLY PMN FEW GRAM POSITIVE COCCI RARE GRAM NEGATIVE RODS RARE GRAM POSITIVE RODS    Culture   Final    Normal respiratory flora-no Staph aureus or Pseudomonas seen Performed at [facility], [place] [address] [place], [place] [address]    Report Status 01/22/2021 FINAL  Final  CULTURE, BLOOD (ROUTINE X 2) w Reflex to ID Panel     Status: Abnormal   Collection Time: 01/19/21  6:45 PM   Specimen: BLOOD  Result Value Ref Range Status   Specimen Description   Final    BLOOD BLOOD LEFT HAND Performed at [facility], [address] [place], [place] [address]    Special Requests   Final    BOTTLES DRAWN AEROBIC AND ANAEROBIC Blood Culture adequate volume Performed at [facility], [address] [place], [place] [address]    Culture  Setup  Time   Final    GRAM POSITIVE COCCI IN BOTH AEROBIC AND ANAEROBIC BOTTLES CRITICAL RESULT CALLED TO, READ BACK BY AND VERIFIED WITH: BRANDON BEERS AT 5170 ON 01/20/21 BY SS    Culture (A)  Final    STAPHYLOCOCCUS EPIDERMIDIS THE SIGNIFICANCE OF ISOLATING THIS ORGANISM FROM A SINGLE SET OF BLOOD CULTURES WHEN MULTIPLE SETS ARE DRAWN IS UNCERTAIN. PLEASE NOTIFY THE MICROBIOLOGY DEPARTMENT WITHIN ONE WEEK IF SPECIATION AND SENSITIVITIES ARE REQUIRED. Performed at [facility], [place] [address], [place],  [address]    Report Status 01/23/2021 FINAL  Final  Blood Culture ID Panel (Reflexed)     Status: Abnormal   Collection Time: 01/19/21  6:45 PM  Result Value Ref Range Status   Enterococcus faecalis NOT DETECTED NOT DETECTED Final   Enterococcus Faecium NOT DETECTED NOT DETECTED Final   Listeria monocytogenes NOT DETECTED NOT DETECTED Final   Staphylococcus species DETECTED (A) NOT DETECTED Final    Comment: CRITICAL RESULT CALLED TO, READ BACK BY AND VERIFIED WITH: BRANDON BEERS AT 1539 ON 01/20/21 BY SS    Staphylococcus aureus (BCID) NOT DETECTED NOT DETECTED Final   Staphylococcus epidermidis DETECTED (A) NOT DETECTED Final    Comment: Methicillin (oxacillin) resistant coagulase negative staphylococcus. Possible blood culture contaminant (unless isolated from more than one blood culture draw or clinical case suggests pathogenicity). No antibiotic treatment is indicated for blood  culture contaminants. CRITICAL RESULT CALLED TO, READ BACK BY AND VERIFIED WITH: BRANDON BEERS AT 4496 ON 01/20/21 BY SS    Staphylococcus lugdunensis NOT DETECTED NOT DETECTED Final   Streptococcus species NOT DETECTED NOT DETECTED Final   Streptococcus agalactiae NOT DETECTED NOT DETECTED Final   Streptococcus pneumoniae NOT DETECTED NOT DETECTED Final   Streptococcus  pyogenes NOT DETECTED NOT DETECTED Final   A.calcoaceticus-baumannii NOT DETECTED NOT DETECTED Final   Bacteroides fragilis NOT  DETECTED NOT DETECTED Final   Enterobacterales NOT DETECTED NOT DETECTED Final   Enterobacter cloacae complex NOT DETECTED NOT DETECTED Final   Escherichia coli NOT DETECTED NOT DETECTED Final   Klebsiella aerogenes NOT DETECTED NOT DETECTED Final   Klebsiella oxytoca NOT DETECTED NOT DETECTED Final   Klebsiella pneumoniae NOT DETECTED NOT DETECTED Final   Proteus species NOT DETECTED NOT DETECTED Final   Salmonella species NOT DETECTED NOT DETECTED Final   Serratia marcescens NOT DETECTED NOT DETECTED Final   Haemophilus influenzae NOT DETECTED NOT DETECTED Final   Neisseria meningitidis NOT DETECTED NOT DETECTED Final   Pseudomonas aeruginosa NOT DETECTED NOT DETECTED Final   Stenotrophomonas maltophilia NOT DETECTED NOT DETECTED Final   Candida albicans NOT DETECTED NOT DETECTED Final   Candida auris NOT DETECTED NOT DETECTED Final   Candida glabrata NOT DETECTED NOT DETECTED Final   Candida krusei NOT DETECTED NOT DETECTED Final   Candida parapsilosis NOT DETECTED NOT DETECTED Final   Candida tropicalis NOT DETECTED NOT DETECTED Final   Cryptococcus neoformans/gattii NOT DETECTED NOT DETECTED Final   Methicillin resistance mecA/C DETECTED (A) NOT DETECTED Final    Comment: CRITICAL RESULT CALLED TO, READ BACK BY AND VERIFIED WITH: BRANDON BEERS AT 1539 ON 01/20/21 BY SS Performed at [facility], [address] [place], [place] [address]   CULTURE, BLOOD (ROUTINE X 2) w Reflex to ID Panel     Status: None   Collection Time: 01/19/21  6:47 PM   Specimen: BLOOD  Result Value Ref Range Status   Specimen Description BLOOD BLOOD RIGHT HAND  Final   Special Requests   Final    BOTTLES DRAWN AEROBIC AND ANAEROBIC Blood Culture adequate volume   Culture   Final    NO GROWTH 5 DAYS Performed at [facility], [address] [place], [place] [address]    Report Status 01/24/2021 FINAL  Final  MRSA PCR Screening     Status: None   Collection Time: 01/20/21  7:58 AM    Specimen: Nasal Mucosa; Nasopharyngeal  Result Value Ref Range Status   MRSA by PCR NEGATIVE NEGATIVE Final    Comment:        The GeneXpert MRSA Assay (FDA approved for NASAL specimens only), is one component of a comprehensive MRSA colonization surveillance program. It is not intended to diagnose MRSA infection nor to guide or monitor treatment for MRSA infections. Performed at [facility], [address] [place], [place] [address]   CULTURE, BLOOD (ROUTINE X 2) w Reflex to ID Panel     Status: None (Preliminary result)   Collection Time: 01/22/21  1:02 PM   Specimen: BLOOD  Result Value Ref Range Status   Specimen Description BLOOD BLOOD LEFT HAND  Final   Special Requests   Final    BOTTLES DRAWN AEROBIC AND ANAEROBIC Blood Culture adequate volume   Culture   Final    NO GROWTH 4 DAYS Performed at [facility], [address], [place], [place] [address]    Report Status PENDING  Incomplete  CULTURE, BLOOD (ROUTINE X 2) w Reflex to ID Panel     Status: None (Preliminary result)   Collection Time: 01/22/21  1:02 PM   Specimen: BLOOD  Result Value Ref Range Status   Specimen Description BLOOD BLOOD RIGHT HAND  Final   Special Requests   Final  BOTTLES DRAWN AEROBIC AND ANAEROBIC Blood Culture adequate volume   Culture   Final    NO GROWTH 4 DAYS Performed at [facility], [address] [place], [place] [address]    Report Status PENDING  Incomplete  Culture, respiratory (non-expectorated)     Status: None (Preliminary result)   Collection Time: 01/25/21 10:30 AM   Specimen: Tracheal Aspirate; Respiratory  Result Value Ref Range Status   Specimen Description   Final    TRACHEAL ASPIRATE Performed at [facility], [address] [place], [place] [address]    Special Requests   Final    NONE Performed at [facility], [address] [place], [place] [address]    Gram Stain   Final    MODERATE WBC  PRESENT,BOTH PMN AND MONONUCLEAR NO ORGANISMS SEEN    Culture   Final    CULTURE REINCUBATED FOR BETTER GROWTH Performed at [facility], [place] [address] [place],  [address]    Report Status PENDING  Incomplete    IMAGING RESULTS:  I have personally reviewed the films ? Impression/Recommendation ?22 yr morbidly obese female admitted with COVID illness and pneumonia Received remdisvir and solumedrol Fever - D.D ARDS R/o PE Blood culture neg Can change foley and send UA There is no concern for cdiff currently as loose stool in the pouch is due to miralax and lactulose and tube feeds.   Will check procal and fungitell Day 7 of cefepime- would stop  stap epi blood culture - likely contaminant received short course of vanco  Afib on amiodarone   Discussed the management with care team ? ___________________________________________________ Discussed with patient, requesting provider Note:  This document was prepared using Dragon voice recognition software and may include unintentional dictation errors.

## 2021-01-26 NOTE — Progress Notes (Signed)
[facility]  ROUNDING NOTE   Subjective:   bumex held this morning  Creatinine 1.9 (1.68)  Objective:  Vital signs in last 24 hours:  Temp:  [97.16 F (36.2 C)-101.12 F (38.4 C)] 99.5 F (37.5 C) (02/15 1300) Pulse Rate:  [93-145] 145 (02/15 1300) Resp:  [0-17] 11 (02/15 1300) BP: (100-143)/(47-64) 100/52 (02/15 1300) SpO2:  [89 %-98 %] 98 % (02/15 1300) FiO2 (%):  [40 %-60 %] 60 % (02/15 1200)  Weight change:  Filed Weights   01/15/21 0420 01/22/21 0445 01/25/21 0500  Weight: (!) 179.6 kg (!) 183.7 kg (!) 189.6 kg    Intake/Output: I/O last 3 completed shifts: In: 7059.9 [I.V.:1514.1; Other:300; RC/VE:9381.0; IV Piggyback:603.3] Out: 1751 [Urine:2955; Stool:700]   Intake/Output this shift:  Total I/O In: 834.4 [I.V.:337.6; Other:100; NG/GT:200; IV Piggyback:196.7] Out: 435 [Urine:435]  Physical Exam: General: Critically ill  Head: ETT  Eyes: Anicteric   Neck:  trachea midline  Lungs:   PRVC FiO2 50%, tachypnic and breathing over her rate  Heart: tachycardia  Abdomen:  Soft, nontender  Extremities:  + peripheral edema.  Neurologic: Intubated and sedated  Skin: No lesions  Access: none    Basic Metabolic Panel: Recent Labs  Lab 01/22/21 0316 01/22/21 1029 01/23/21 0046 01/23/21 1208 01/23/21 1650 01/24/21 0435 01/25/21 0510 01/26/21 0436  NA 142  --  140  --   --  141 143 143  K 5.8*   < > 5.6*  5.5* 4.4 4.9 5.2* 4.5 3.7  CL 109  --  109  --   --  107 107 111  CO2 23  --  23  --   --  _0 GLUCOSE 258*  --  239*  --   --  283* 249* 160*  BUN 67*  --  61*  --   --  74* 87* 96*  CREATININE 1.16*  --  0.95  --   --  1.33* 1.68* 1.93*  CALCIUM 8.9  --  8.9  --   --  9.0 9.5 9.7  MG 2.4  --  2.0  --   --  2.2 2.2 2.5*  PHOS 2.7  --  3.7  --   --  7.3* 6.3* 5.2*   < > = values in this interval not displayed.    Liver Function Tests: Recent Labs  Lab 01/22/21 0316 01/23/21 0046 01/24/21 0435 01/25/21 0510 01/26/21 0436   ALBUMIN 2.2* 2.3* 2.3* 2.3* 2.2*   No results for input(s): LIPASE, AMYLASE in the last 168 hours. No results for input(s): AMMONIA in the last 168 hours.  CBC: Recent Labs  Lab 01/22/21 0316 01/23/21 0046 01/24/21 0435 01/25/21 0510 01/26/21 0436  WBC 8.7 9.6 11.7* 12.9* 14.0*  NEUTROABS 5.9 6.9 9.1* 9.8* 9.4*  HGB 11.4* 10.6* 10.7* 9.8* 9.0*  HCT 37.5 35.5* 35.6* 31.8* 30.4*  MCV 86.6 86.2 89.4 88.3 88.6  PLT 286 269 321 282 308    Cardiac Enzymes: No results for input(s): CKTOTAL, CKMB, CKMBINDEX, TROPONINI in the last 168 hours.  BNP: Invalid input(s): POCBNP  CBG: Recent Labs  Lab 01/25/21 1913 01/26/21 0005 01/26/21 0302 01/26/21 0836 01/26/21 1151  GLUCAP 216* 164* 130* 191* 22*    Microbiology: Results for orders placed or performed during the hospital encounter of 01/15/2021  Blood Culture (routine x 2)     Status: None   Collection Time: 01/31/2021  2:56 PM   Specimen: BLOOD  Result Value Ref Range Status  Specimen Description BLOOD BLOOD LEFT ARM  Final   Special Requests   Final    BOTTLES DRAWN AEROBIC AND ANAEROBIC Blood Culture results may not be optimal due to an inadequate volume of blood received in culture bottles   Culture   Final    NO GROWTH 5 DAYS Performed at [facility], [address], [place], [place] [address]    Report Status 01/19/2021 FINAL  Final  Blood Culture (routine x 2)     Status: None   Collection Time: 01/21/2021  2:59 PM   Specimen: BLOOD  Result Value Ref Range Status   Specimen Description BLOOD BLOOD LEFT FOREARM  Final   Special Requests   Final    BOTTLES DRAWN AEROBIC AND ANAEROBIC Blood Culture adequate volume   Culture   Final    NO GROWTH 5 DAYS Performed at [facility], [address] [place], [place] [address]    Report Status 01/19/2021 FINAL  Final  Culture, respiratory     Status: None   Collection Time: 01/15/21  5:50 PM   Specimen: Tracheal Aspirate; Respiratory  Result  Value Ref Range Status   Specimen Description   Final    TRACHEAL ASPIRATE Performed at [facility], [address], [place], [place] [address]    Special Requests   Final    NONE Performed at [facility], [address] [place], [place] [address]    Gram Stain   Final    FEW WBC PRESENT, PREDOMINANTLY PMN FEW GRAM NEGATIVE RODS FEW GRAM POSITIVE RODS RARE GRAM POSITIVE COCCI    Culture   Final    Normal respiratory flora-no Staph aureus or Pseudomonas seen Performed at [facility], [place] [address] [place], [place] [address]    Report Status 01/18/2021 FINAL  Final  Culture, respiratory (non-expectorated)     Status: None   Collection Time: 01/19/21  6:20 PM   Specimen: Tracheal Aspirate; Respiratory  Result Value Ref Range Status   Specimen Description   Final    TRACHEAL ASPIRATE Performed at [facility], [address] [place], [place] [address]    Special Requests   Final    NONE Performed at [facility], [address] [place], [place] [address]    Gram Stain   Final    ABUNDANT WBC PRESENT, PREDOMINANTLY PMN FEW GRAM POSITIVE COCCI RARE GRAM NEGATIVE RODS RARE GRAM POSITIVE RODS    Culture   Final    Normal respiratory flora-no Staph aureus or Pseudomonas seen Performed at [facility], [place] [address], [place], [place] [address]    Report Status 01/22/2021 FINAL  Final  CULTURE, BLOOD (ROUTINE X 2) w Reflex to ID Panel     Status: Abnormal   Collection Time: 01/19/21  6:45 PM   Specimen: BLOOD  Result Value Ref Range Status   Specimen Description   Final    BLOOD BLOOD LEFT HAND Performed at [facility], [address], [place], [place] [address]    Special Requests   Final    BOTTLES DRAWN AEROBIC AND ANAEROBIC Blood Culture adequate volume Performed at [facility], [address] [place],  [address]    Culture  Setup Time   Final    GRAM POSITIVE COCCI IN BOTH  AEROBIC AND ANAEROBIC BOTTLES CRITICAL RESULT CALLED TO, READ BACK BY AND VERIFIED WITH: BRANDON BEERS AT 1539 ON 01/20/21 BY SS    Culture (A)  Final    STAPHYLOCOCCUS EPIDERMIDIS THE SIGNIFICANCE OF ISOLATING THIS ORGANISM FROM A SINGLE SET OF BLOOD CULTURES WHEN MULTIPLE SETS ARE DRAWN IS UNCERTAIN. PLEASE NOTIFY THE MICROBIOLOGY DEPARTMENT WITHIN ONE WEEK IF SPECIATION AND SENSITIVITIES ARE REQUIRED. Performed at [facility], [place] [address], [place], [place] [address]    Report Status 01/23/2021 FINAL  Final  Blood Culture ID Panel (Reflexed)     Status: Abnormal   Collection Time: 01/19/21  6:45 PM  Result Value Ref Range Status   Enterococcus faecalis NOT DETECTED NOT DETECTED Final   Enterococcus Faecium NOT DETECTED NOT DETECTED Final   Listeria monocytogenes NOT DETECTED NOT DETECTED Final   Staphylococcus species DETECTED (A) NOT DETECTED Final    Comment: CRITICAL RESULT CALLED TO, READ BACK BY AND VERIFIED WITH: BRANDON BEERS AT 1539 ON 01/20/21 BY SS    Staphylococcus aureus (BCID) NOT DETECTED NOT DETECTED Final   Staphylococcus epidermidis DETECTED (A) NOT DETECTED Final    Comment: Methicillin (oxacillin) resistant coagulase negative staphylococcus. Possible blood culture contaminant (unless isolated from more than one blood culture draw or clinical case suggests pathogenicity). No antibiotic treatment is indicated for blood  culture contaminants. CRITICAL RESULT CALLED TO, READ BACK BY AND VERIFIED WITH: BRANDON BEERS AT 9798 ON 01/20/21 BY SS    Staphylococcus lugdunensis NOT DETECTED NOT DETECTED Final   Streptococcus species NOT DETECTED NOT DETECTED Final   Streptococcus agalactiae NOT DETECTED NOT DETECTED Final   Streptococcus pneumoniae NOT DETECTED NOT DETECTED Final   Streptococcus pyogenes NOT DETECTED NOT DETECTED Final   A.calcoaceticus-baumannii NOT DETECTED NOT DETECTED Final   Bacteroides fragilis NOT DETECTED NOT DETECTED Final   Enterobacterales  NOT DETECTED NOT DETECTED Final   Enterobacter cloacae complex NOT DETECTED NOT DETECTED Final   Escherichia coli NOT DETECTED NOT DETECTED Final   Klebsiella aerogenes NOT DETECTED NOT DETECTED Final   Klebsiella oxytoca NOT DETECTED NOT DETECTED Final   Klebsiella pneumoniae NOT DETECTED NOT DETECTED Final   Proteus species NOT DETECTED NOT DETECTED Final   Salmonella species NOT DETECTED NOT DETECTED Final   Serratia marcescens NOT DETECTED NOT DETECTED Final   Haemophilus influenzae NOT DETECTED NOT DETECTED Final   Neisseria meningitidis NOT DETECTED NOT DETECTED Final   Pseudomonas aeruginosa NOT DETECTED NOT DETECTED Final   Stenotrophomonas maltophilia NOT DETECTED NOT DETECTED Final   Candida albicans NOT DETECTED NOT DETECTED Final   Candida auris NOT DETECTED NOT DETECTED Final   Candida glabrata NOT DETECTED NOT DETECTED Final   Candida krusei NOT DETECTED NOT DETECTED Final   Candida parapsilosis NOT DETECTED NOT DETECTED Final   Candida tropicalis NOT DETECTED NOT DETECTED Final   Cryptococcus neoformans/gattii NOT DETECTED NOT DETECTED Final   Methicillin resistance mecA/C DETECTED (A) NOT DETECTED Final    Comment: CRITICAL RESULT CALLED TO, READ BACK BY AND VERIFIED WITH: BRANDON BEERS AT 1539 ON 01/20/21 BY SS Performed at [facility], [address] [place], [place] [address]   CULTURE, BLOOD (ROUTINE X 2) w Reflex to ID Panel     Status: None   Collection Time: 01/19/21  6:47 PM   Specimen: BLOOD  Result Value Ref Range Status   Specimen Description BLOOD BLOOD RIGHT HAND  Final   Special Requests   Final    BOTTLES DRAWN AEROBIC AND ANAEROBIC Blood Culture adequate volume   Culture   Final    NO GROWTH 5 DAYS Performed at [facility], [address], [place], [place] [address]  Report Status 01/24/2021 FINAL  Final  MRSA PCR Screening     Status: None   Collection Time: 01/20/21  7:58 AM   Specimen: Nasal Mucosa; Nasopharyngeal   Result Value Ref Range Status   MRSA by PCR NEGATIVE NEGATIVE Final    Comment:        The GeneXpert MRSA Assay (FDA approved for NASAL specimens only), is one component of a comprehensive MRSA colonization surveillance program. It is not intended to diagnose MRSA infection nor to guide or monitor treatment for MRSA infections. Performed at [facility], [address] [place], [place] [address]   CULTURE, BLOOD (ROUTINE X 2) w Reflex to ID Panel     Status: None (Preliminary result)   Collection Time: 01/22/21  1:02 PM   Specimen: BLOOD  Result Value Ref Range Status   Specimen Description BLOOD BLOOD LEFT HAND  Final   Special Requests   Final    BOTTLES DRAWN AEROBIC AND ANAEROBIC Blood Culture adequate volume   Culture   Final    NO GROWTH 4 DAYS Performed at [facility], [address] [place], [place] [address]    Report Status PENDING  Incomplete  CULTURE, BLOOD (ROUTINE X 2) w Reflex to ID Panel     Status: None (Preliminary result)   Collection Time: 01/22/21  1:02 PM   Specimen: BLOOD  Result Value Ref Range Status   Specimen Description BLOOD BLOOD RIGHT HAND  Final   Special Requests   Final    BOTTLES DRAWN AEROBIC AND ANAEROBIC Blood Culture adequate volume   Culture   Final    NO GROWTH 4 DAYS Performed at [facility], [address], [place], [place] [address]    Report Status PENDING  Incomplete  Culture, respiratory (non-expectorated)     Status: None (Preliminary result)   Collection Time: 01/25/21 10:30 AM   Specimen: Tracheal Aspirate; Respiratory  Result Value Ref Range Status   Specimen Description   Final    TRACHEAL ASPIRATE Performed at [facility], [address] [place], [place] [address]    Special Requests   Final    NONE Performed at [facility], [address] [place], [place] [address]    Gram Stain   Final    MODERATE WBC PRESENT,BOTH PMN AND MONONUCLEAR NO ORGANISMS  SEEN    Culture   Final    CULTURE REINCUBATED FOR BETTER GROWTH Performed at [facility], [place] [address], [place], [place] [address]    Report Status PENDING  Incomplete    Coagulation Studies: No results for input(s): LABPROT, INR in the last 72 hours.  Urinalysis: No results for input(s): COLORURINE, LABSPEC, PHURINE, GLUCOSEU, HGBUR, BILIRUBINUR, KETONESUR, PROTEINUR, UROBILINOGEN, NITRITE, LEUKOCYTESUR in the last 72 hours.  Invalid input(s): APPERANCEUR    Imaging: DG Chest Port 1 View  Result Date: 01/25/2021 CLINICAL DATA:  01/19/2021 EXAM: PORTABLE CHEST 1 VIEW COMPARISON:  Six days ago FINDINGS: Endotracheal tube with tip at the clavicular heads. The enteric tube at least reaches the stomach. Right PICC with tip at the upper right atrium. Stable cardiomegaly, low lung volumes, and pulmonary opacification. IMPRESSION: Stable hardware positioning and pulmonary opacification. Electronically Signed   By: Monte Fantasia M.D.   On: 01/25/2021 06:31     Medications:   . sodium chloride 5 mL/hr at 01/26/21 1200  . amiodarone 60 mg/hr (01/26/21 1249)   Followed by  . amiodarone    . dexmedetomidine (PRECEDEX) IV  infusion 1.2 mcg/kg/hr (01/26/21 1230)  . famotidine (PEPCID) IV Stopped (01/26/21 9937)  . feeding supplement (NEPRO CARB STEADY) 1,000 mL (01/26/21 0914)  . fentaNYL infusion INTRAVENOUS 300 mcg/hr (01/26/21 1221)  . midazolam 2 mg/hr (01/26/21 1221)   . amLODipine  10 mg Per Tube Daily  . vitamin C  1,000 mg Per Tube TID  . chlorhexidine gluconate (MEDLINE KIT)  15 mL Mouth Rinse BID  . Chlorhexidine Gluconate Cloth  6 each Topical Daily  . enoxaparin (LOVENOX) injection  1 mg/kg Subcutaneous Q12H  . feeding supplement (PROSource TF)  45 mL Per Tube QID  . free water  200 mL Per Tube Q4H  . insulin aspart  0-20 Units Subcutaneous Q4H  . insulin aspart  5 Units Subcutaneous Q4H  . insulin detemir  22 Units Subcutaneous Daily  . lactulose  10 g Per  Tube BID  . mouth rinse  15 mL Mouth Rinse 10 times per day  . metoprolol tartrate  100 mg Per Tube BID  . polyethylene glycol  17 g Per Tube Daily  . senna-docusate  2 tablet Per Tube BID  . sodium chloride flush  10-40 mL Intracatheter Q12H  . sodium chloride flush  10-40 mL Intracatheter Q12H  . sodium chloride flush  3 mL Intravenous Q12H   sodium chloride, acetaminophen (TYLENOL) oral liquid 160 mg/5 mL, albuterol, guaiFENesin-dextromethorphan, labetalol, midazolam, ondansetron (ZOFRAN) IV, sodium chloride flush, sodium chloride flush, sodium chloride flush  Assessment/ Plan:  Dana Bruce is a 55 y.o. white female with hypertension, IBS, diabetes mellitus type II, who was admitted to [facility] on 01/20/2021 for Acute respiratory failure with hypoxia ([place]) [J96.01] Pneumonia due to COVID-19 virus [U07.1, J12.82] COVID-19 [U07.1]  Patient remains intubated and requiring mechanical ventilation. Nephrology consulted for persistent hyperkalemia  1. Hyperkalemia: with acute kidney injury. Potassium goal <5.5 - Continue lokelma  - Evaluate daily for diuretics.  - Hold home medication of telmisartan.   2. Acute kidney injury:  Nonoliguric urine output. Responding to diuretics.  3. Hypertension: fluid driven - Continue amlodipine and metoprolol - IV diuretics as above.   4. 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 On metformin. Hemoglobin A1c at goal at 6.3%.  - holding metformin.    LOS: 12 Dakota Vanwart 2/15/20221:46 PM

## 2021-01-26 NOTE — Progress Notes (Signed)
GOALS OF CARE DISCUSSION  The Clinical status was relayed to family in detail. Husband and Children  Updated and notified of patients medical condition.  Patient remains unresponsive and will not open eyes to command.    Patient is having a weak cough and struggling to remove secretions.   Patient with increased WOB and using accessory muscles to breathe Explained to family course of therapy and the modalities     Patient with Progressive multiorgan failure with a very high probablity of a very minimal chance of meaningful recovery despite all aggressive and optimal medical therapy.  Process associated with Suffering.  Family understands the situation.   PATIENT REMAINS FULL CODE  FAMILY WILL NEED TO MAKE DECISION OF CODE STATUS, HD and TRACH in next several days.  Family are satisfied with Plan of action and management. All questions answered  Additional CC time 32 mins   Jeremie Abdelaziz Santiago Glad, M.D.  [facility]  Medical Director [facility] Medical Director [facility] Cardio-Pulmonary Department

## 2021-01-26 NOTE — Consult Note (Signed)
PHARMACY CONSULT NOTE  Pharmacy Consult for Electrolyte Monitoring and Replacement   Recent Labs: Potassium (mmol/L)  Date Value  01/26/2021 3.7   Magnesium (mg/dL)  Date Value  40/37/5436 2.5 (H)   Calcium (mg/dL)  Date Value  06/77/0340 9.7   Albumin (g/dL)  Date Value  35/24/8185 2.2 (L)   Phosphorus (mg/dL)  Date Value  90/93/1121 5.2 (H)   Sodium (mmol/L)  Date Value  01/26/2021 143   Assessment: 55 yo F with COVID-19 presenting with acute hypoxic respiratory failure in the setting of COVID-19 pneumonia. PMH includes HTN, diabetes, and IBS. Pt intubated 2/4. Pharmacy consulted for electrolyte monitoring and replacement.  Nutrition: Tube feeds + free water flushes 200 mL q4h (1.2 L/day) Diuretics: IV Bumex 1 mg daily (DC due to worsening CrCl)  Goal of Therapy:  Electrolytes WNL  Plan:  2/15 AM labs:  K 3.7  Mag 2.5  Scr 1.93 - Discontinue Lokelma 10g daily, K levels WNL - No electrolyte replacement needed today - Will follow-up electrolytes with tomorrow's AM labs  Gloriann Loan, PharmD Student 01/26/2021 8:35 AM

## 2021-01-26 NOTE — Consult Note (Signed)
ANTICOAGULATION CONSULT NOTE - Initial Consult  Pharmacy Consult for Lovenox dosing Indication: atrial fibrillation  Allergies  Allergen Reactions  . Lasix [Furosemide] Itching  . Penicillins Hives and Itching    Has patient had a PCN reaction causing immediate rash, facial/tongue/throat swelling, SOB or lightheadedness with hypotension: yes Has patient had a PCN reaction causing severe rash involving mucus membranes or skin necrosis: no Has patient had a PCN reaction that required hospitalization No Has patient had a PCN reaction occurring within the last 10 years: No If all of the above answers are "NO", then may proceed with Cephalosporin use.   Rolland Porter Flavor [Flavoring Agent] Itching, Nausea And Vomiting and Rash    Breaks out in places where she touches strawberries     Patient Measurements: Height: 5\' 7"  (170.2 cm) Weight: (!) 189.6 kg (418 lb) IBW/kg (Calculated) : 61.6   Vital Signs: Temp: 99.5 F (37.5 C) (02/15 1300) Temp Source: Esophageal (02/15 1200) BP: 100/52 (02/15 1300) Pulse Rate: 145 (02/15 1300)  Labs: Recent Labs    01/24/21 0435 01/25/21 0510 01/26/21 0436  HGB 10.7* 9.8* 9.0*  HCT 35.6* 31.8* 30.4*  PLT 321 282 308  CREATININE 1.33* 1.68* 1.93*    Estimated Creatinine Clearance: 58.6 mL/min (A) (by C-G formula based on SCr of 1.93 mg/dL (H)).   Medical History: Past Medical History:  Diagnosis Date  . Arthritis    hips  . Diabetes mellitus without complication (HCC)    type 2  . Family history of adverse reaction to anesthesia    mother has ponv  . Goiter    followed by dr gerkin q year  . Hypertension   . IBS (irritable bowel syndrome)   . PONV (postoperative nausea and vomiting)    ponv 1 day after last dental surgery    Assessment: Pt is a 55 y.o. F presenting to the hospital due to acute respiratory failure with hypoxia secondary to COVID-19 pneumonia. PMH includes HTN, T2DM, and IBS. During sedation adjustments, pt  developed A.fib RVR, with no PMH of A.fib on file. She was initiated on amiodarone bolus and drip. Pharmacy has been consulted for Lovenox dosing.   Goal of Therapy:  Anti-Xa level 0.6-1 units/ml 4hrs after LMWH dose given Monitor platelets by anticoagulation protocol: Yes   Plan:  --Lovenox 190 mg q12h (1mg /kg, 189.6 mg rounded to 190 mg) --Due to pt's body habitus, monitor anti-Xa levels after the 4th dose at 2/17 1000 and adjust dose as needed per protocol.  , PharmD Student 01/26/2021,1:51 PM

## 2021-01-27 DIAGNOSIS — U071 COVID-19: Secondary | ICD-10-CM | POA: Diagnosis not present

## 2021-01-27 DIAGNOSIS — I4891 Unspecified atrial fibrillation: Secondary | ICD-10-CM | POA: Diagnosis not present

## 2021-01-27 DIAGNOSIS — J1282 Pneumonia due to coronavirus disease 2019: Secondary | ICD-10-CM | POA: Diagnosis not present

## 2021-01-27 DIAGNOSIS — J9601 Acute respiratory failure with hypoxia: Secondary | ICD-10-CM | POA: Diagnosis not present

## 2021-01-27 LAB — BLOOD GAS, ARTERIAL
Acid-Base Excess: 2.8 mmol/L — ABNORMAL HIGH (ref 0.0–2.0)
Acid-Base Excess: 1.2 mmol/L (ref 0.0–2.0)
Acid-base deficit: 0.1 mmol/L (ref 0.0–2.0)
Bicarbonate: 26.9 mmol/L (ref 20.0–28.0)
Bicarbonate: 31.4 mmol/L — ABNORMAL HIGH (ref 20.0–28.0)
Bicarbonate: 27.9 mmol/L (ref 20.0–28.0)
FIO2: 1
FIO2: 0.8
MECHVT: 500 mL
MECHVT: 500 mL
O2 Saturation: 85.8 %
O2 Saturation: 97.6 %
O2 Saturation: 96.2 %
PEEP: 10 cmH2O
PEEP: 10 cmH2O
Patient temperature: 37
Patient temperature: 37
Patient temperature: 37
RATE: 24 resp/min
RATE: 30 resp/min
pCO2 arterial: 51 mmHg — ABNORMAL HIGH (ref 32.0–48.0)
pCO2 arterial: 70 mmHg (ref 32.0–48.0)
pCO2 arterial: 53 mmHg — ABNORMAL HIGH (ref 32.0–48.0)
pH, Arterial: 7.33 — ABNORMAL LOW (ref 7.350–7.450)
pH, Arterial: 7.26 — ABNORMAL LOW (ref 7.350–7.450)
pH, Arterial: 7.33 — ABNORMAL LOW (ref 7.350–7.450)
pO2, Arterial: 55 mmHg — ABNORMAL LOW (ref 83.0–108.0)
pO2, Arterial: 111 mmHg — ABNORMAL HIGH (ref 83.0–108.0)
pO2, Arterial: 89 mmHg (ref 83.0–108.0)

## 2021-01-27 LAB — RENAL FUNCTION PANEL
Albumin: 2.1 g/dL — ABNORMAL LOW (ref 3.5–5.0)
Anion gap: 11 (ref 5–15)
BUN: 108 mg/dL — ABNORMAL HIGH (ref 6–20)
CO2: 21 mmol/L — ABNORMAL LOW (ref 22–32)
Calcium: 9.7 mg/dL (ref 8.9–10.3)
Chloride: 112 mmol/L — ABNORMAL HIGH (ref 98–111)
Creatinine, Ser: 2.88 mg/dL — ABNORMAL HIGH (ref 0.44–1.00)
GFR, Estimated: 19 mL/min — ABNORMAL LOW (ref >60)
Glucose, Bld: 160 mg/dL — ABNORMAL HIGH (ref 70–99)
Phosphorus: 6.4 mg/dL — ABNORMAL HIGH (ref 2.5–4.6)
Potassium: 4.1 mmol/L (ref 3.5–5.1)
Sodium: 144 mmol/L (ref 135–145)

## 2021-01-27 LAB — CBC WITH DIFFERENTIAL/PLATELET
Abs Immature Granulocytes: 0.47 10*3/uL — ABNORMAL HIGH (ref 0.00–0.07)
Basophils Absolute: 0 10*3/uL (ref 0.0–0.1)
Basophils Relative: 0 %
Eosinophils Absolute: 0.5 10*3/uL (ref 0.0–0.5)
Eosinophils Relative: 3 %
HCT: 28.5 % — ABNORMAL LOW (ref 36.0–46.0)
Hemoglobin: 8.3 g/dL — ABNORMAL LOW (ref 12.0–15.0)
Immature Granulocytes: 4 %
Lymphocytes Relative: 21 %
Lymphs Abs: 2.8 10*3/uL (ref 0.7–4.0)
MCH: 26.1 pg (ref 26.0–34.0)
MCHC: 29.1 g/dL — ABNORMAL LOW (ref 30.0–36.0)
MCV: 89.6 fL (ref 80.0–100.0)
Monocytes Absolute: 1.5 10*3/uL — ABNORMAL HIGH (ref 0.1–1.0)
Monocytes Relative: 11 %
Neutro Abs: 8.2 10*3/uL — ABNORMAL HIGH (ref 1.7–7.7)
Neutrophils Relative %: 61 %
Platelets: 286 10*3/uL (ref 150–400)
RBC: 3.18 MIL/uL — ABNORMAL LOW (ref 3.87–5.11)
RDW: 14.5 % (ref 11.5–15.5)
WBC: 13.4 10*3/uL — ABNORMAL HIGH (ref 4.0–10.5)
nRBC: 0 % (ref 0.0–0.2)

## 2021-01-27 LAB — D-DIMER, QUANTITATIVE: D-Dimer, Quant: 0.85 ug/mL-FEU — ABNORMAL HIGH (ref 0.00–0.50)

## 2021-01-27 LAB — CULTURE, BLOOD (ROUTINE X 2)
Culture: NO GROWTH
Culture: NO GROWTH
Special Requests: ADEQUATE
Special Requests: ADEQUATE

## 2021-01-27 LAB — PROCALCITONIN: Procalcitonin: 0.3 ng/mL

## 2021-01-27 LAB — GLUCOSE, CAPILLARY
Glucose-Capillary: 118 mg/dL — ABNORMAL HIGH (ref 70–99)
Glucose-Capillary: 201 mg/dL — ABNORMAL HIGH (ref 70–99)
Glucose-Capillary: 227 mg/dL — ABNORMAL HIGH (ref 70–99)
Glucose-Capillary: 213 mg/dL — ABNORMAL HIGH (ref 70–99)
Glucose-Capillary: 179 mg/dL — ABNORMAL HIGH (ref 70–99)

## 2021-01-27 LAB — HEPARIN ANTI-XA: Heparin LMW: 0.84 IU/mL

## 2021-01-27 LAB — MAGNESIUM: Magnesium: 2.5 mg/dL — ABNORMAL HIGH (ref 1.7–2.4)

## 2021-01-27 LAB — C-REACTIVE PROTEIN: CRP: 1.1 mg/dL — ABNORMAL HIGH (ref <1.0)

## 2021-01-27 LAB — FERRITIN: Ferritin: 169 ng/mL (ref 11–307)

## 2021-01-27 MED ORDER — FAMOTIDINE IN NACL 20-0.9 MG/50ML-% IV SOLN
20.0000 mg | INTRAVENOUS | Status: DC
  Administered 2021-01-27 – 2021-01-31 (×5): 20 mg via INTRAVENOUS
  Filled 2021-01-27 (×6): qty 50

## 2021-01-27 MED ORDER — PROSOURCE TF PO LIQD
45.0000 mL | Freq: Two times a day (BID) | ORAL | Status: DC
  Administered 2021-01-27 – 2021-02-01 (×10): 45 mL
  Filled 2021-01-27 (×11): qty 45

## 2021-01-27 MED ORDER — NEPRO/CARBSTEADY PO LIQD
1000.0000 mL | ORAL | Status: DC
  Administered 2021-01-27 – 2021-02-01 (×6): 1000 mL

## 2021-01-27 NOTE — Progress Notes (Signed)
Spoke with pt's husband Dana Bruce to update him on his wife's status and current plan of care.  We discussed that she remains critically ill on the vent with worsening renal failure that may require Renal Replacement therapy in the near future.  We also discussed that she has remained on the vent for 12 days, and we are nearing the time for deciding on pursing Tracheostomy vs. One way extubation.  Mr. Bourquin requests that her code status be changed to DNR/DNI.  He confirms that if Dialysis were needed short term he would want to pursue it.    He does state that he knows his wife would not want to live confined to a ventilator in a nursing facility, and that he doesn't want her to suffer.    He will continue to think about which path to pursue (Trach vs. One way extubation) as we continue to try and wean from the ventilator in the next few days.        Harlon Ditty, AGACNP-BC [facility] Pager: [phone]

## 2021-01-27 NOTE — Progress Notes (Signed)
ID Remains intubated No fever today  LDA PICC 01/23/21 NG/OD 01/15/21 Foley-01/15/21 ETT 01/15/21   Patient Vitals for the past 24 hrs:  BP Temp Temp src Pulse Resp SpO2  01/27/21 1200 (!) 143/68 98.24 F (36.8 C) Esophageal (!) 119 (!) 21 92 %  01/27/21 1100 (!) 149/59 (!) 97.34 F (36.3 C) -- 72 (!) 22 94 %  01/27/21 1000 129/62 99.5 F (37.5 C) -- (!) 122 16 96 %  01/27/21 0900 (!) 142/61 98.42 F (36.9 C) -- (!) 125 18 93 %  01/27/21 0800 133/68 98.24 F (36.8 C) Esophageal 98 (!) 8 96 %  01/27/21 0700 (!) 123/59 99.14 F (37.3 C) -- 89 13 97 %  01/27/21 0600 125/66 98.96 F (37.2 C) -- 85 (!) 8 97 %  01/27/21 0500 (!) 127/53 98.6 F (37 C) -- 83 15 97 %  01/27/21 0400 (!) 114/59 98.42 F (36.9 C) -- 94 (!) 4 97 %  01/27/21 0300 133/64 99.32 F (37.4 C) -- (!) 105 (!) 3 97 %  01/27/21 0200 (!) 128/47 99.32 F (37.4 C) -- 96 (!) 5 97 %  01/27/21 0100 120/62 98.06 F (36.7 C) -- 89 (!) 0 98 %  01/27/21 0000 133/63 (!) 97.34 F (36.3 C) -- 89 (!) 4 97 %  01/26/21 2300 127/65 98.96 F (37.2 C) -- (!) 110 (!) 5 97 %  01/26/21 2200 (!) 114/55 100.22 F (37.9 C) -- (!) 104 (!) 6 97 %  01/26/21 2100 140/66 (!) 102.2 F (39 C) -- (!) 109 12 96 %  01/26/21 2000 136/68 (!) 103.1 F (39.5 C) -- 82 13 96 %  01/26/21 1900 140/73 (!) 103.1 F (39.5 C) -- (!) 129 (!) 4 95 %  01/26/21 1800 129/67 (!) 102.92 F (39.4 C) -- 98 11 95 %  01/26/21 1700 139/62 (!) 102.74 F (39.3 C) -- 87 (!) 9 97 %  01/26/21 1600 137/69 (!) 102.38 F (39.1 C) Esophageal (!) 117 15 96 %  01/26/21 1500 (!) 134/55 (!) 102.38 F (39.1 C) Esophageal (!) 107 15 97 %  01/26/21 1400 (!) 120/46 (!) 101.3 F (38.5 C) -- (!) 127 15 95 %  01/26/21 1358 -- -- -- -- -- 97 %  01/26/21 1300 (!) 100/52 99.5 F (37.5 C) -- (!) 145 15 98 %   Intubated, sedated, morbidly obese No fever today Chest b/l air entry abd obese- pannus foely    Labs CBC Latest Ref Rng & Units 01/27/2021 01/26/2021 01/25/2021  WBC  4.0 - 10.5 K/uL 13.4(H) 14.0(H) 12.9(H)  Hemoglobin 12.0 - 15.0 g/dL 8.3(L) 9.0(L) 9.8(L)  Hematocrit 36.0 - 46.0 % 28.5(L) 30.4(L) 31.8(L)  Platelets 150 - 400 K/uL 286 308 282    CMP Latest Ref Rng & Units 01/27/2021 01/26/2021 01/25/2021  Glucose 70 - 99 mg/dL 440(H) 474(Q) 595(G)  BUN 6 - 20 mg/dL 387(F) 64(P) 32(R)  Creatinine 0.44 - 1.00 mg/dL 5.18(A) 4.16(S) 0.63(K)  Sodium 135 - 145 mmol/L 144 143 143  Potassium 3.5 - 5.1 mmol/L 4.1 3.7 4.5  Chloride 98 - 111 mmol/L 112(H) 111 107  CO2 22 - 32 mmol/L 21(L) 23 24  Calcium 8.9 - 10.3 mg/dL 9.7 9.7 9.5  Total Protein 6.5 - 8.1 g/dL - - -  Total Bilirubin 0.3 - 1.2 mg/dL - - -  Alkaline Phos 38 - 126 U/L - - -  AST 15 - 41 U/L - - -  ALT 0 - 44 U/L - - -  Impression/recommendation  55 year old morbidly obese female admitted with Covid illness and pneumonia. Received remdesivir and Solu-Medrol. She was initially on Levaquin and then on cefepime. She had fever and I saw the patient yesterday. We stopped the cefepime. No fever since yesterday. Blood culture was negative The differential diagnosis was ARDS, PE  Patient had loose stools but has been on laxatives until yesterday and it has been stopped. Will observe.  Acute hypoxic respiratory failure secondary to Covid. Intubated  Covid pneumonia  Staph epidermidis in blood culture is a contaminant.  A. fib on amiodarone  Discussed the management the care team. If the patient remains afebrile tomorrow I will sign off.

## 2021-01-27 NOTE — Progress Notes (Signed)
Nutrition Follow-up  DOCUMENTATION CODES:   Morbid obesity  INTERVENTION:  Initiate new goal TF regimen of Nepro at 65 mL/hr (1560 mL goal daily volume) + PROSource TF 45 mL BID per tube. Provides 2888 kcal, 148 grams of protein, 1139 mL H2O daily.  NUTRITION DIAGNOSIS:   Inadequate oral intake related to inability to eat (pt sedated and ventilated) as evidenced by NPO status.  Ongoing.  GOAL:   Provide needs based on ASPEN/SCCM guidelines  Met with TF regimen.  MONITOR:   Vent status,Labs,Weight trends,Skin,I & O's  REASON FOR ASSESSMENT:   Ventilator    ASSESSMENT:   55 y/o female with h/o DM, HTN, IBS, DM, goiter and OSA who is admitted with COVID 19.  2/4 intubated 2/11 changed to Nepro  Patient is currently intubated on ventilator support MV: 8 L/min Temp (24hrs), Avg:100 F (37.8 C), Min:97.34 F (36.3 C), Max:103.1 F (39.5 C)  Medications reviewed and include: vitamin C 1000 mg TID per tube, free water flush 200 mL Q4hrs, Novolog 0-20 units Q4hrs, Novolog 5 untis Q4hrs, Levemir 22 units daily, Precedex gtt, famotidine, fentanyl gtt now stopped.  Labs reviewed: CBG 118-201, Chloride 112, CO2 21, Creatinine 2.88, Phosphorus 6.4, Magnesium 2.5.  I/O: 1010 mL UOP yesterday (0.2 mL/kg/hr)  Weight trend: 189.6 kg on 2/14; +10 kg from 2/4 (pt +11603.6 mL on I/O)  Enteral Access: OGT placed 2/4; reaches at least the stomach per chest x-ray 2/14  TF regimen: Nepro at 55 mL/hr + PROSource TF 45 mL QID per tube  Discussed on rounds. Patient tolerating tube feeds well. May require dialysis.  Diet Order:   Diet Order            Diet NPO time specified  Diet effective now                EDUCATION NEEDS:   No education needs have been identified at this time  Skin:  Skin Assessment: Skin Integrity Issues: (MASD to groin)  Last BM:  01/27/2021 - type 7 per rectal tube  Height:   Ht Readings from Last 1 Encounters:  01/15/2021 _0  (1.702 m)    Weight:   Wt Readings from Last 1 Encounters:  01/25/21 (!) 189.6 kg   Ideal Body Weight:  61.36 kg  BMI:  Body mass index is 65.47 kg/m.  Estimated Nutritional Needs:   Kcal:  2800-3000  Protein:  150-160 grams  Fluid:  >/= 2 L/day  Jacklynn Barnacle, MS, RD, LDN Pager number available on Amion

## 2021-01-27 NOTE — Consult Note (Signed)
PHARMACY CONSULT NOTE  Pharmacy Consult for Electrolyte Monitoring and Replacement   Recent Labs: Potassium (mmol/L)  Date Value  01/27/2021 4.1   Magnesium (mg/dL)  Date Value  65/78/4696 2.5 (H)   Calcium (mg/dL)  Date Value  29/52/8413 9.7   Albumin (g/dL)  Date Value  24/40/1027 2.1 (L)   Phosphorus (mg/dL)  Date Value  25/36/6440 6.4 (H)   Sodium (mmol/L)  Date Value  01/27/2021 144   Assessment: 55 yo F with COVID-19 presenting with acute hypoxic respiratory failure in the setting of COVID-19 pneumonia. PMH includes HTN, diabetes, and IBS. Pt intubated 2/4. Pharmacy consulted for electrolyte monitoring and replacement.  Nutrition: Tube feeds + free water flushes 200 mL q4h (1.2 L/day)  Goal of Therapy:  - K >4 - Mag >2 - all other electrolytes WNL  Plan:  2/16 AM labs:  K 4.1  Mag 2.5  Scr 2.88 (worsening) - No electrolyte replacement needed today - Will follow-up electrolytes with tomorrow's AM labs  Gloriann Loan, PharmD Student 01/27/2021 8:07 AM

## 2021-01-27 NOTE — Progress Notes (Signed)
[facility]  ROUNDING NOTE   Subjective:   UOP 1061m Creatinine 2.88 (1.93) - bumex held yesterday and today.   Creatinine 1.9 (1.68)  Objective:  Vital signs in last 24 hours:  Temp:  [97.34 F (36.3 C)-103.1 F (39.5 C)] 98.24 F (36.8 C) (02/16 1200) Pulse Rate:  [72-145] 119 (02/16 1200) Resp:  [0-22] 21 (02/16 1200) BP: (100-149)/(46-73) 143/68 (02/16 1200) SpO2:  [92 %-98 %] 92 % (02/16 1200) FiO2 (%):  [40 %-50 %] 50 % (02/16 1200)  Weight change:  Filed Weights   01/15/21 0420 01/22/21 0445 01/25/21 0500  Weight: (!) 179.6 kg (!) 183.7 kg (!) 189.6 kg    Intake/Output: I/O last 3 completed shifts: In: 6677.1 [I.V.:2425.3; Other:100; NG/GT:3801.8; IV Piggyback:350.1] Out: 2770 [Urine:1760; Stool:1010]   Intake/Output this shift:  Total I/O In: 454.9 [I.V.:304.9; Other:150] Out: 50 [Urine:50]  Physical Exam: General: Critically ill  Head: ETT  Eyes: Anicteric   Neck:  trachea midline  Lungs:   PRVC FiO2 50%, tachypnic and breathing over her rate  Heart: Tachycardia, irregular  Abdomen:  Soft, nontender  Extremities:  + peripheral edema.  Neurologic: Intubated and sedated  Skin: No lesions  Access: none    Basic Metabolic Panel: Recent Labs  Lab 01/23/21 0046 01/23/21 1208 01/23/21 1650 01/24/21 0435 01/25/21 0510 01/26/21 0436 01/27/21 0530  NA 140  --   --  141 143 143 144  K 5.6*  5.5*   < > 4.9 5.2* 4.5 3.7 4.1  CL 109  --   --  107 107 111 112*  CO2 23  --   --  _0 21*  GLUCOSE 239*  --   --  283* 249* 160* 160*  BUN 61*  --   --  74* 87* 96* 108*  CREATININE 0.95  --   --  1.33* 1.68* 1.93* 2.88*  CALCIUM 8.9  --   --  9.0 9.5 9.7 9.7  MG 2.0  --   --  2.2 2.2 2.5* 2.5*  PHOS 3.7  --   --  7.3* 6.3* 5.2* 6.4*   < > = values in this interval not displayed.    Liver Function Tests: Recent Labs  Lab 01/23/21 0046 01/24/21 0435 01/25/21 0510 01/26/21 0436 01/27/21 0530  ALBUMIN 2.3* 2.3* 2.3* 2.2* 2.1*    No results for input(s): LIPASE, AMYLASE in the last 168 hours. No results for input(s): AMMONIA in the last 168 hours.  CBC: Recent Labs  Lab 01/23/21 0046 01/24/21 0435 01/25/21 0510 01/26/21 0436 01/27/21 0530  WBC 9.6 11.7* 12.9* 14.0* 13.4*  NEUTROABS 6.9 9.1* 9.8* 9.4* 8.2*  HGB 10.6* 10.7* 9.8* 9.0* 8.3*  HCT 35.5* 35.6* 31.8* 30.4* 28.5*  MCV 86.2 89.4 88.3 88.6 89.6  PLT 269 321 282 308 286    Cardiac Enzymes: No results for input(s): CKTOTAL, CKMB, CKMBINDEX, TROPONINI in the last 168 hours.  BNP: Invalid input(s): POCBNP  CBG: Recent Labs  Lab 01/26/21 1944 01/26/21 2334 01/27/21 0318 01/27/21 0756 01/27/21 1101  GLUCAP 185* 129* 118* 201* 27    Microbiology: Results for orders placed or performed during the hospital encounter of 01/19/2021  Blood Culture (routine x 2)     Status: None   Collection Time: 01/28/2021  2:56 PM   Specimen: BLOOD  Result Value Ref Range Status   Specimen Description BLOOD BLOOD LEFT ARM  Final   Special Requests   Final    BOTTLES DRAWN AEROBIC AND ANAEROBIC Blood  Culture results may not be optimal due to an inadequate volume of blood received in culture bottles   Culture   Final    NO GROWTH 5 DAYS Performed at [facility], [address] [place], [place] [address]    Report Status 01/19/2021 FINAL  Final  Blood Culture (routine x 2)     Status: None   Collection Time: 01/20/2021  2:59 PM   Specimen: BLOOD  Result Value Ref Range Status   Specimen Description BLOOD BLOOD LEFT FOREARM  Final   Special Requests   Final    BOTTLES DRAWN AEROBIC AND ANAEROBIC Blood Culture adequate volume   Culture   Final    NO GROWTH 5 DAYS Performed at [facility], [address] [place], [place] [address]    Report Status 01/19/2021 FINAL  Final  Culture, respiratory     Status: None   Collection Time: 01/15/21  5:50 PM   Specimen: Tracheal Aspirate; Respiratory  Result Value Ref Range Status   Specimen  Description   Final    TRACHEAL ASPIRATE Performed at [facility], [address], [place], [place] [address]    Special Requests   Final    NONE Performed at [facility], [address] [place], [place] [address]    Gram Stain   Final    FEW WBC PRESENT, PREDOMINANTLY PMN FEW GRAM NEGATIVE RODS FEW GRAM POSITIVE RODS RARE GRAM POSITIVE COCCI    Culture   Final    Normal respiratory flora-no Staph aureus or Pseudomonas seen Performed at [facility], [place] [address], [place], [place] [address]    Report Status 01/18/2021 FINAL  Final  Culture, respiratory (non-expectorated)     Status: None   Collection Time: 01/19/21  6:20 PM   Specimen: Tracheal Aspirate; Respiratory  Result Value Ref Range Status   Specimen Description   Final    TRACHEAL ASPIRATE Performed at [facility], [address] [place], [place] [address]    Special Requests   Final    NONE Performed at [facility], [address] [place], [place] [address]    Gram Stain   Final    ABUNDANT WBC PRESENT, PREDOMINANTLY PMN FEW GRAM POSITIVE COCCI RARE GRAM NEGATIVE RODS RARE GRAM POSITIVE RODS    Culture   Final    Normal respiratory flora-no Staph aureus or Pseudomonas seen Performed at [facility], [place] [address] [place], [place] [address]    Report Status 01/22/2021 FINAL  Final  CULTURE, BLOOD (ROUTINE X 2) w Reflex to ID Panel     Status: Abnormal   Collection Time: 01/19/21  6:45 PM   Specimen: BLOOD  Result Value Ref Range Status   Specimen Description   Final    BLOOD BLOOD LEFT HAND Performed at [facility], [address] [place], [place] [address]    Special Requests   Final    BOTTLES DRAWN AEROBIC AND ANAEROBIC Blood Culture adequate volume Performed at [facility], [address] [place],  [address]    Culture  Setup Time   Final    GRAM POSITIVE COCCI IN BOTH AEROBIC AND ANAEROBIC  BOTTLES CRITICAL RESULT CALLED TO, READ BACK BY AND VERIFIED WITH: BRANDON BEERS AT 1539 ON 01/20/21 BY SS    Culture (A)  Final    STAPHYLOCOCCUS EPIDERMIDIS THE SIGNIFICANCE OF ISOLATING THIS ORGANISM FROM A SINGLE SET OF BLOOD CULTURES WHEN MULTIPLE SETS ARE DRAWN  IS UNCERTAIN. PLEASE NOTIFY THE MICROBIOLOGY DEPARTMENT WITHIN ONE WEEK IF SPECIATION AND SENSITIVITIES ARE REQUIRED. Performed at [facility], [address] [place], [place] [address]    Report Status 01/23/2021 FINAL  Final  Blood Culture ID Panel (Reflexed)     Status: Abnormal   Collection Time: 01/19/21  6:45 PM  Result Value Ref Range Status   Enterococcus faecalis NOT DETECTED NOT DETECTED Final   Enterococcus Faecium NOT DETECTED NOT DETECTED Final   Listeria monocytogenes NOT DETECTED NOT DETECTED Final   Staphylococcus species DETECTED (A) NOT DETECTED Final    Comment: CRITICAL RESULT CALLED TO, READ BACK BY AND VERIFIED WITH: BRANDON BEERS AT 1539 ON 01/20/21 BY SS    Staphylococcus aureus (BCID) NOT DETECTED NOT DETECTED Final   Staphylococcus epidermidis DETECTED (A) NOT DETECTED Final    Comment: Methicillin (oxacillin) resistant coagulase negative staphylococcus. Possible blood culture contaminant (unless isolated from more than one blood culture draw or clinical case suggests pathogenicity). No antibiotic treatment is indicated for blood  culture contaminants. CRITICAL RESULT CALLED TO, READ BACK BY AND VERIFIED WITH: BRANDON BEERS AT 1539 ON 01/20/21 BY SS    Staphylococcus lugdunensis NOT DETECTED NOT DETECTED Final   Streptococcus species NOT DETECTED NOT DETECTED Final   Streptococcus agalactiae NOT DETECTED NOT DETECTED Final   Streptococcus pneumoniae NOT DETECTED NOT DETECTED Final   Streptococcus pyogenes NOT DETECTED NOT DETECTED Final   A.calcoaceticus-baumannii NOT DETECTED NOT DETECTED Final   Bacteroides fragilis NOT DETECTED NOT DETECTED Final   Enterobacterales NOT DETECTED NOT  DETECTED Final   Enterobacter cloacae complex NOT DETECTED NOT DETECTED Final   Escherichia coli NOT DETECTED NOT DETECTED Final   Klebsiella aerogenes NOT DETECTED NOT DETECTED Final   Klebsiella oxytoca NOT DETECTED NOT DETECTED Final   Klebsiella pneumoniae NOT DETECTED NOT DETECTED Final   Proteus species NOT DETECTED NOT DETECTED Final   Salmonella species NOT DETECTED NOT DETECTED Final   Serratia marcescens NOT DETECTED NOT DETECTED Final   Haemophilus influenzae NOT DETECTED NOT DETECTED Final   Neisseria meningitidis NOT DETECTED NOT DETECTED Final   Pseudomonas aeruginosa NOT DETECTED NOT DETECTED Final   Stenotrophomonas maltophilia NOT DETECTED NOT DETECTED Final   Candida albicans NOT DETECTED NOT DETECTED Final   Candida auris NOT DETECTED NOT DETECTED Final   Candida glabrata NOT DETECTED NOT DETECTED Final   Candida krusei NOT DETECTED NOT DETECTED Final   Candida parapsilosis NOT DETECTED NOT DETECTED Final   Candida tropicalis NOT DETECTED NOT DETECTED Final   Cryptococcus neoformans/gattii NOT DETECTED NOT DETECTED Final   Methicillin resistance mecA/C DETECTED (A) NOT DETECTED Final    Comment: CRITICAL RESULT CALLED TO, READ BACK BY AND VERIFIED WITH: BRANDON BEERS AT 1539 ON 01/20/21 BY SS Performed at [facility], [address] [place], [place] [address]   CULTURE, BLOOD (ROUTINE X 2) w Reflex to ID Panel     Status: None   Collection Time: 01/19/21  6:47 PM   Specimen: BLOOD  Result Value Ref Range Status   Specimen Description BLOOD BLOOD RIGHT HAND  Final   Special Requests   Final    BOTTLES DRAWN AEROBIC AND ANAEROBIC Blood Culture adequate volume   Culture   Final    NO GROWTH 5 DAYS Performed at [facility], [address] [place], [place] [address]    Report Status 01/24/2021 FINAL  Final  MRSA PCR Screening     Status: None   Collection Time: 01/20/21  7:58 AM   Specimen: Nasal Mucosa; Nasopharyngeal  Result Value Ref  Range Status   MRSA by PCR NEGATIVE NEGATIVE Final    Comment:        The GeneXpert MRSA Assay (FDA approved for NASAL specimens only), is one component of a comprehensive MRSA colonization surveillance program. It is not intended to diagnose MRSA infection nor to guide or monitor treatment for MRSA infections. Performed at [facility], [address] [place], [place] [address]   CULTURE, BLOOD (ROUTINE X 2) w Reflex to ID Panel     Status: None   Collection Time: 01/22/21  1:02 PM   Specimen: BLOOD  Result Value Ref Range Status   Specimen Description BLOOD BLOOD LEFT HAND  Final   Special Requests   Final    BOTTLES DRAWN AEROBIC AND ANAEROBIC Blood Culture adequate volume   Culture   Final    NO GROWTH 5 DAYS Performed at [facility], [address] [place], [place] [address]    Report Status 01/27/2021 FINAL  Final  CULTURE, BLOOD (ROUTINE X 2) w Reflex to ID Panel     Status: None   Collection Time: 01/22/21  1:02 PM   Specimen: BLOOD  Result Value Ref Range Status   Specimen Description BLOOD BLOOD RIGHT HAND  Final   Special Requests   Final    BOTTLES DRAWN AEROBIC AND ANAEROBIC Blood Culture adequate volume   Culture   Final    NO GROWTH 5 DAYS Performed at [facility], [address], [place], [place] [address]    Report Status 01/27/2021 FINAL  Final  Culture, respiratory (non-expectorated)     Status: None (Preliminary result)   Collection Time: 01/25/21 10:30 AM   Specimen: Tracheal Aspirate; Respiratory  Result Value Ref Range Status   Specimen Description   Final    TRACHEAL ASPIRATE Performed at [facility], [address], [place], [place] [address]    Special Requests   Final    NONE Performed at [facility], [address] [place], [place] [address]    Gram Stain   Final    MODERATE WBC PRESENT,BOTH PMN AND MONONUCLEAR NO ORGANISMS SEEN Performed at [facility], [place] [address], [place], [place] [address]    Culture FEW YEAST  Final   Report Status PENDING  Incomplete    Coagulation Studies: No results for input(s): LABPROT, INR in the last 72 hours.  Urinalysis: No results for input(s): COLORURINE, LABSPEC, PHURINE, GLUCOSEU, HGBUR, BILIRUBINUR, KETONESUR, PROTEINUR, UROBILINOGEN, NITRITE, LEUKOCYTESUR in the last 72 hours.  Invalid input(s): APPERANCEUR    Imaging: No results found.   Medications:   . sodium chloride 5 mL/hr at 01/27/21 1200  . amiodarone 30 mg/hr (01/27/21 1200)  . dexmedetomidine (PRECEDEX) IV infusion 0.6 mcg/kg/hr (01/27/21 1232)  . famotidine (PEPCID) IV    . feeding supplement (NEPRO CARB STEADY)    . fentaNYL infusion INTRAVENOUS Stopped (01/27/21 0758)  . midazolam Stopped (01/26/21 1625)   . amLODipine  10 mg Per Tube Daily  . vitamin C  1,000 mg Per Tube TID  . chlorhexidine gluconate (MEDLINE KIT)  15 mL Mouth Rinse BID  . Chlorhexidine Gluconate Cloth  6 each Topical Daily  . enoxaparin (LOVENOX) injection  1 mg/kg Subcutaneous Q12H  . feeding supplement (PROSource TF)  45 mL Per Tube BID  . free water  200 mL Per Tube Q4H  . insulin aspart  0-20 Units Subcutaneous Q4H  .  insulin aspart  5 Units Subcutaneous Q4H  . insulin detemir  22 Units Subcutaneous Daily  . mouth rinse  15 mL Mouth Rinse 10 times per day  . metoprolol tartrate  100 mg Per Tube BID  . sodium chloride flush  10-40 mL Intracatheter Q12H  . sodium chloride flush  10-40 mL Intracatheter Q12H  . sodium chloride flush  3 mL Intravenous Q12H   sodium chloride, acetaminophen (TYLENOL) oral liquid 160 mg/5 mL, acetaminophen, albuterol, guaiFENesin-dextromethorphan, labetalol, midazolam, ondansetron (ZOFRAN) IV, sodium chloride flush, sodium chloride flush, sodium chloride flush  Assessment/ Plan:  Ms. Dana Bruce is a 55 y.o. white female with hypertension, IBS, diabetes mellitus type II, who was admitted to [facility] on 01/21/2021 for Acute respiratory  failure with hypoxia ([place]) [J96.01] Pneumonia due to COVID-19 virus [U07.1, J12.82] COVID-19 [U07.1]  Patient remains intubated and requiring mechanical ventilation. Nephrology consulted for persistent hyperkalemia  1. Hyperkalemia: with acute kidney injury. Potassium now at goal Status post lokelma and bumex - Evaluate daily for diuretics.  - Hold home medication of telmisartan.   2. Acute kidney injury:  Nonoliguric urine output. Responding to diuretics. No acute indication for dialysis at this time. Continue to monitor volume status, serum electrolytes, renal function and urine output.   3. Hypertension: fluid driven. With atrial fibrillation.  - Continue amlodipine and metoprolol - IV diuretics as above.   4. 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 On metformin. Hemoglobin A1c at goal at 6.3%.  - holding metformin.   5. Acute respiratory failure: requiring intubation and mechanical ventilation. COVID-19 infection, ARDS.    LOS: 60 Brooks Stotz 2/16/202212:53 PM

## 2021-01-27 NOTE — Progress Notes (Signed)
NAME:  Dana Bruce, MRN:  [id number], DOB:  [birth year], LOS: 92 ADMISSION DATE:  01/26/2021, INITIAL CONSULTATION DATE:  01/23/2021 REFERRING MD:  Dr. Jari Pigg,  CHIEF COMPLAINT: Shortness of breath  Brief History:  55 year old unvaccinated female admitted with acute hypoxic respiratory failure in the setting of COVID-19 pneumonia   Subjective Findings:  01/15/2021- patient with worsening resp distress s/p ETT.  Has been started on lasix. Due to BMI >62 will hold proning. Patient with resp failure s/p ETT - I have updated husband Dana Bruce via phone today. 01/16/2021- patient was awake and had attempted to self extubate, sedation was increased. Midline was placed due to inadequate access no need for central line at this time. AM labs with worsening GFR, hyperglycemia.  Vitals stable. UOP adequate appx 1L overnight, CXR with bilateral multifocal infiltrates. Ventilator management performed.  01/17/2021- patient has been weaned on MV from FIO2-100% to 70%, repeat ABG this afternoon.  Called husband today (POA Dana Bruce) reviewed care plan and answered questions.  01/19/2021-appears volume overloaded, still with increased work of breathing when sedation is lightened 2/9FAILURE TO WEAN FROM VENT, SEVERE ALI 2/10 severe resp failure, failure to wean from vent 2/11 failed weaning trials +hyperkalemia 2/12 severe resp failure, failed weaning trials, PICC line placed+hyperkalemia 2/13 severe resp failure 01/27/2021- Patient is worsening, renal function declining nephrology consultation today, she is febrile remains critically ill on 50% fiO2, on OG Feeds with nepro, has PICC line,  Has been on numerous antibiotics and ID is following.  Goals of care discussion today with family.  UOP is only 300 overnight. Family updated today via phone.   Past Medical History:  Hypertension Diabetes mellitus Arthritis Goiter IBS    Consults:  PCCM  Procedures:  N/A  Significant Diagnostic Tests:  2/3: Chest  x-ray>>IMPRESSION: Diffuse bilateral ground-glass airspace opacities consistent with the patient's history of viral pneumonia. 2/8: Chest x-ray>> improved aeration from prior  Micro Data:  1/30: SARS-CoV-2 PCR>> positive 2/3: Blood culture x2>> 2/3: Sputum>> 2/3: Strep pneumo urinary antigen>> 2/3: Legionella urinary antigen>>  Antimicrobials:  Remdesivir 2/3>> 2/8 Levaquin 2/3>>   Objective   Blood pressure (!) 123/59, pulse 89, temperature 99.14 F (37.3 C), resp. rate 13, height _0  (1.702 m), weight (!) 189.6 kg, SpO2 97 %.    Vent Mode: PRVC FiO2 (%):  [40 %-60 %] 50 % Set Rate:  [15 bmp] 15 bmp Vt Set:  [500 mL] 500 mL PEEP:  [5 cmH20] 5 cmH20 Plateau Pressure:  [15 cmH20-17 cmH20] 15 cmH20   Intake/Output Summary (Last 24 hours) at 01/27/2021 0744 Last data filed at 01/27/2021 0600 Gross per 24 hour  Intake 3810.75 ml  Output 1520 ml  Net 2290.75 ml   Filed Weights   01/15/21 0420 01/22/21 0445 01/25/21 0500  Weight: (!) 179.6 kg (!) 183.7 kg (!) 189.6 kg    Examination: Physical examination is limited due to need for PPE/CAPR General: Morbidly obese Age appropreiate.  On MV sedated, a synchronous with vent when sedation lightened HENT: Atraumatic, normocephalic, sclera edema, neck supple, trachea midline, no crepitus Lungs: Coarse breath sounds with occasional rhonchi Cardiovascular: Monitor sinus rhythm Abdomen: severely obese, protuberant, nondistended, soft  Extremities: No deformities, +2 anasarca Neuro: Sedated, opens eyes but does not track does not follow commands. GU: Foley in place. Skin: Warm and dry.  No obvious rashes, lesions, ulcerations  IMAGING      Assessment & Plan:   Acute Hypoxic Respiratory Failure in the setting of COVID-19 Pneumonia -Supplemental  O2 as needed to maintain O2 saturations greater than 88% -On MV - PRVC 50% with weaning per RT -Full ventilator support -follow  ARDS net protocol high PEEP ladder -PEEP minimum 8  due to body habitus -Remdesivir, plan for 5 days>> completed -Steroids (Solu-Medrol 60 mg twice daily)>>>completed  -Follow inflammatory markers: Ferritin, D-dimer, CRP -Vitamin C, zinc -Respiratory cultures negative -Bronchodilators via MDI vent adapter -Maintain euvolemia to net negative fluid balance as able -Bumex x1 today for volume overload -Unable to prone due to body habitus (BMI 62) -Maintain airborne and contact precautions      Diabetes mellitus with severe hyperglycemia -CBGs -Sliding scale insulin -Follow ICU hypo/hyperglycemia protocol   Acute kidney Injury Stage 5  - likely due to critical illness with probable ATN  Morbid obesity BMI 62 This issue adds complexity to her management Unable to prone due to body habitus Will make weaning challenging PEEP minimum of 8 due to body habitus to avoid derecruitment         Best practice (evaluated daily)  Diet: On trickle feeds, tolerating, adjust as Propofol d/c'd Pain/Anxiety/Delirium protocol (if indicated): Precedex/fentanyl, propofol being weaned off VAP protocol (if indicated): Yes, active DVT prophylaxis: Lovenox subcu GI prophylaxis: Pepcid Glucose control: Sliding scale insulin Mobility: As tolerated Disposition: ICU  Goals of Care:   Code Status: Full code  Labs   CBC: Recent Labs  Lab 01/23/21 0046 01/24/21 0435 01/25/21 0510 01/26/21 0436 01/27/21 0530  WBC 9.6 11.7* 12.9* 14.0* 13.4*  NEUTROABS 6.9 9.1* 9.8* 9.4* 8.2*  HGB 10.6* 10.7* 9.8* 9.0* 8.3*  HCT 35.5* 35.6* 31.8* 30.4* 28.5*  MCV 86.2 89.4 88.3 88.6 89.6  PLT 269 321 282 308 712    Basic Metabolic Panel: Recent Labs  Lab 01/23/21 0046 01/23/21 1208 01/23/21 1650 01/24/21 0435 01/25/21 0510 01/26/21 0436 01/27/21 0530  NA 140  --   --  141 143 143 144  K 5.6*  5.5*   < > 4.9 5.2* 4.5 3.7 4.1  CL 109  --   --  107 107 111 112*  CO2 23  --   --  _0 21*  GLUCOSE 239*  --   --  283* 249* 160* 160*  BUN 61*  --    --  74* 87* 96* 108*  CREATININE 0.95  --   --  1.33* 1.68* 1.93* 2.88*  CALCIUM 8.9  --   --  9.0 9.5 9.7 9.7  MG 2.0  --   --  2.2 2.2 2.5* 2.5*  PHOS 3.7  --   --  7.3* 6.3* 5.2* 6.4*   < > = values in this interval not displayed.   GFR: Estimated Creatinine Clearance: 39.3 mL/min (A) (by C-G formula based on SCr of 2.88 mg/dL (H)). Recent Labs  Lab 01/24/21 0435 01/25/21 0510 01/26/21 0436 01/27/21 0530  PROCALCITON  --   --   --  0.30  WBC 11.7* 12.9* 14.0* 13.4*    Liver Function Tests: Recent Labs  Lab 01/23/21 0046 01/24/21 0435 01/25/21 0510 01/26/21 0436 01/27/21 0530  ALBUMIN 2.3* 2.3* 2.3* 2.2* 2.1*   No results for input(s): LIPASE, AMYLASE in the last 168 hours. No results for input(s): AMMONIA in the last 168 hours.  ABG    Component Value Date/Time   PHART 7.33 (L) 01/18/2021 0428   PCO2ART 53 (H) 01/18/2021 0428   PO2ART 89 01/18/2021 0428   HCO3 27.9 01/18/2021 0428   ACIDBASEDEF 0.8 01/15/2021 1133   O2SAT  96.2 01/18/2021 0428     Coagulation Profile: No results for input(s): INR, PROTIME in the last 168 hours.  Cardiac Enzymes: No results for input(s): CKTOTAL, CKMB, CKMBINDEX, TROPONINI in the last 168 hours.  HbA1C: Hgb A1c MFr Bld  Date/Time Value Ref Range Status  01/22/2021 02:41 PM 6.3 (H) 4.8 - 5.6 % Final    Comment:    (NOTE) Pre diabetes:          5.7%-6.4%  Diabetes:              >6.4%  Glycemic control for   <7.0% adults with diabetes     CBG: Recent Labs  Lab 01/26/21 1151 01/26/21 1535 01/26/21 1944 01/26/21 2334 01/27/21 0318  GLUCAP 221* 227* 185* 129* 118*    Review of Systems:    Unable to complete while on MV sedated to RASS-3   Allergies Allergies  Allergen Reactions  . Lasix [Furosemide] Itching  . Penicillins Hives and Itching    Has patient had a PCN reaction causing immediate rash, facial/tongue/throat swelling, SOB or lightheadedness with hypotension: yes Has patient had a PCN reaction  causing severe rash involving mucus membranes or skin necrosis: no Has patient had a PCN reaction that required hospitalization No Has patient had a PCN reaction occurring within the last 10 years: No If all of the above answers are "NO", then may proceed with Cephalosporin use.   [place] Flavor [Flavoring Agent] Itching, Nausea And Vomiting and Rash    Breaks out in places where she touches strawberries      Current medications  Scheduled Meds: . amLODipine  10 mg Per Tube Daily  . vitamin C  1,000 mg Per Tube TID  . chlorhexidine gluconate (MEDLINE KIT)  15 mL Mouth Rinse BID  . Chlorhexidine Gluconate Cloth  6 each Topical Daily  . enoxaparin (LOVENOX) injection  1 mg/kg Subcutaneous Q12H  . feeding supplement (PROSource TF)  45 mL Per Tube QID  . free water  200 mL Per Tube Q4H  . insulin aspart  0-20 Units Subcutaneous Q4H  . insulin aspart  5 Units Subcutaneous Q4H  . insulin detemir  22 Units Subcutaneous Daily  . mouth rinse  15 mL Mouth Rinse 10 times per day  . metoprolol tartrate  100 mg Per Tube BID  . sodium chloride flush  10-40 mL Intracatheter Q12H  . sodium chloride flush  10-40 mL Intracatheter Q12H  . sodium chloride flush  3 mL Intravenous Q12H   Continuous Infusions: . sodium chloride 5 mL/hr at 01/27/21 0600  . amiodarone 30 mg/hr (01/27/21 0600)  . dexmedetomidine (PRECEDEX) IV infusion 0.5 mcg/kg/hr (01/27/21 0600)  . famotidine (PEPCID) IV 20 mg (01/26/21 2159)  . feeding supplement (NEPRO CARB STEADY) 55 mL/hr at 01/27/21 0600  . fentaNYL infusion INTRAVENOUS 200 mcg/hr (01/27/21 0600)  . midazolam Stopped (01/26/21 1625)   PRN Meds:.sodium chloride, acetaminophen (TYLENOL) oral liquid 160 mg/5 mL, acetaminophen, albuterol, guaiFENesin-dextromethorphan, labetalol, midazolam, ondansetron (ZOFRAN) IV, sodium chloride flush, sodium chloride flush, sodium chloride flush    Critical care time: 33 minutes    Critical care provider statement:     Critical care time (minutes):  33   Critical care time was exclusive of:  Separately billable procedures and  treating other patients   Critical care was necessary to treat or prevent imminent or  life-threatening deterioration of the following conditions:  acute hypoxemic respiratory failure due to COVID19, mobid obesity, aki, multiple comorbid conditinos   Critical care  was time spent personally by me on the following  activities:  Development of treatment plan with patient or surrogate,  discussions with consultants, evaluation of patient's response to  treatment, examination of patient, obtaining history from patient or  surrogate, ordering and performing treatments and interventions, ordering  and review of laboratory studies and re-evaluation of patient's condition   I assumed direction of critical care for this patient from another  provider in my specialty: no    Multidisciplinary rounds were performed with the ICU team.  Prognosis is guarded.     Ottie Glazier, M.D.  [facility] [place]    *This note was dictated using voice recognition software/Dragon.  Despite best efforts to proofread, errors can occur which can change the meaning.  Any change was purely unintentional.

## 2021-01-28 ENCOUNTER — Inpatient Hospital Stay: Payer: Managed Care, Other (non HMO)

## 2021-01-28 DIAGNOSIS — U071 COVID-19: Secondary | ICD-10-CM | POA: Diagnosis not present

## 2021-01-28 DIAGNOSIS — E1165 Type 2 diabetes mellitus with hyperglycemia: Secondary | ICD-10-CM

## 2021-01-28 DIAGNOSIS — J9601 Acute respiratory failure with hypoxia: Secondary | ICD-10-CM | POA: Diagnosis not present

## 2021-01-28 DIAGNOSIS — J1282 Pneumonia due to coronavirus disease 2019: Secondary | ICD-10-CM | POA: Diagnosis not present

## 2021-01-28 LAB — CBC WITH DIFFERENTIAL/PLATELET
Abs Immature Granulocytes: 0.47 10*3/uL — ABNORMAL HIGH (ref 0.00–0.07)
Basophils Absolute: 0 10*3/uL (ref 0.0–0.1)
Basophils Relative: 0 %
Eosinophils Absolute: 0.7 10*3/uL — ABNORMAL HIGH (ref 0.0–0.5)
Eosinophils Relative: 5 %
HCT: 27.9 % — ABNORMAL LOW (ref 36.0–46.0)
Hemoglobin: 8.6 g/dL — ABNORMAL LOW (ref 12.0–15.0)
Immature Granulocytes: 3 %
Lymphocytes Relative: 10 %
Lymphs Abs: 1.6 10*3/uL (ref 0.7–4.0)
MCH: 26.8 pg (ref 26.0–34.0)
MCHC: 30.8 g/dL (ref 30.0–36.0)
MCV: 86.9 fL (ref 80.0–100.0)
Monocytes Absolute: 1.4 10*3/uL — ABNORMAL HIGH (ref 0.1–1.0)
Monocytes Relative: 9 %
Neutro Abs: 11.4 10*3/uL — ABNORMAL HIGH (ref 1.7–7.7)
Neutrophils Relative %: 73 %
Platelets: 306 10*3/uL (ref 150–400)
RBC: 3.21 MIL/uL — ABNORMAL LOW (ref 3.87–5.11)
RDW: 14.6 % (ref 11.5–15.5)
WBC: 15.7 10*3/uL — ABNORMAL HIGH (ref 4.0–10.5)
nRBC: 0 % (ref 0.0–0.2)

## 2021-01-28 LAB — CULTURE, RESPIRATORY W GRAM STAIN

## 2021-01-28 LAB — GLUCOSE, CAPILLARY
Glucose-Capillary: 167 mg/dL — ABNORMAL HIGH (ref 70–99)
Glucose-Capillary: 171 mg/dL — ABNORMAL HIGH (ref 70–99)
Glucose-Capillary: 185 mg/dL — ABNORMAL HIGH (ref 70–99)
Glucose-Capillary: 199 mg/dL — ABNORMAL HIGH (ref 70–99)
Glucose-Capillary: 212 mg/dL — ABNORMAL HIGH (ref 70–99)
Glucose-Capillary: 215 mg/dL — ABNORMAL HIGH (ref 70–99)
Glucose-Capillary: 217 mg/dL — ABNORMAL HIGH (ref 70–99)

## 2021-01-28 LAB — RENAL FUNCTION PANEL
Albumin: 2.2 g/dL — ABNORMAL LOW (ref 3.5–5.0)
Anion gap: 11 (ref 5–15)
BUN: 130 mg/dL — ABNORMAL HIGH (ref 6–20)
CO2: 21 mmol/L — ABNORMAL LOW (ref 22–32)
Calcium: 10 mg/dL (ref 8.9–10.3)
Chloride: 109 mmol/L (ref 98–111)
Creatinine, Ser: 3.6 mg/dL — ABNORMAL HIGH (ref 0.44–1.00)
GFR, Estimated: 14 mL/min — ABNORMAL LOW (ref >60)
Glucose, Bld: 210 mg/dL — ABNORMAL HIGH (ref 70–99)
Phosphorus: 6.9 mg/dL — ABNORMAL HIGH (ref 2.5–4.6)
Potassium: 4.3 mmol/L (ref 3.5–5.1)
Sodium: 141 mmol/L (ref 135–145)

## 2021-01-28 LAB — C-REACTIVE PROTEIN: CRP: 1.8 mg/dL — ABNORMAL HIGH (ref <1.0)

## 2021-01-28 LAB — HEPARIN ANTI-XA: Heparin LMW: 1.85 IU/mL

## 2021-01-28 LAB — MAGNESIUM: Magnesium: 2.6 mg/dL — ABNORMAL HIGH (ref 1.7–2.4)

## 2021-01-28 LAB — FERRITIN: Ferritin: 117 ng/mL (ref 11–307)

## 2021-01-28 LAB — D-DIMER, QUANTITATIVE: D-Dimer, Quant: 0.66 ug/mL-FEU — ABNORMAL HIGH (ref 0.00–0.50)

## 2021-01-28 MED ORDER — ENOXAPARIN SODIUM 300 MG/3ML IJ SOLN
1.0000 mg/kg | INTRAMUSCULAR | Status: DC
  Filled 2021-01-28: qty 1.9

## 2021-01-28 MED ORDER — DILTIAZEM HCL 30 MG PO TABS
30.0000 mg | ORAL_TABLET | Freq: Four times a day (QID) | ORAL | Status: DC
  Administered 2021-01-28 – 2021-02-01 (×16): 30 mg
  Filled 2021-01-28 (×16): qty 1

## 2021-01-28 MED ORDER — GERHARDT'S BUTT CREAM
TOPICAL_CREAM | Freq: Four times a day (QID) | CUTANEOUS | Status: DC
  Administered 2021-01-30 – 2021-01-31 (×4): 1 via TOPICAL
  Filled 2021-01-28: qty 1

## 2021-01-28 NOTE — Progress Notes (Addendum)
Date of Admission:  01/28/2021     ID: Dana Bruce is a 55 y.o. female  Active Problems:   COVID-19    Subjective: Remains intubated and sedated Has pink frothy secretions from the lungs  Medications:  . chlorhexidine gluconate (MEDLINE KIT)  15 mL Mouth Rinse BID  . Chlorhexidine Gluconate Cloth  6 each Topical Daily  . diltiazem  30 mg Per Tube Q6H  . [START ON 01/13/2021] enoxaparin (LOVENOX) injection  1 mg/kg Subcutaneous Q24H  . feeding supplement (PROSource TF)  45 mL Per Tube BID  . Gerhardt's butt cream   Topical QID  . insulin aspart  0-20 Units Subcutaneous Q4H  . insulin aspart  5 Units Subcutaneous Q4H  . insulin detemir  22 Units Subcutaneous Daily  . mouth rinse  15 mL Mouth Rinse 10 times per day  . metoprolol tartrate  100 mg Per Tube BID  . sodium chloride flush  10-40 mL Intracatheter Q12H  . sodium chloride flush  10-40 mL Intracatheter Q12H  . sodium chloride flush  3 mL Intravenous Q12H    Objective: Vital signs in last 24 hours: Temp:  [98.06 F (36.7 C)-100.04 F (37.8 C)] 99.68 F (37.6 C) (02/17 0600) Pulse Rate:  [49-132] 132 (02/17 0843) Resp:  [17-33] 19 (02/17 0600) BP: (127-161)/(54-97) 127/61 (02/17 1300) SpO2:  [91 %-97 %] 94 % (02/17 0737) FiO2 (%):  [40 %-50 %] 40 % (02/17 1340) Weight:  [186.9 kg] 186.9 kg (02/17 0334)  PHYSICAL EXAM:  General: Intubated sedated, morbidly obese Pink frothy secretions from the mouth Lungs: Bilateral air entry, bilateral crepitus Heart: Irregular, increased ventricular rate abdomen: Soft, pannus Extremities: atraumatic, no cyanosis. No edema. No clubbing Skin: Macerated skin in the inguinal area and perineal area Lymph: Cervical, supraclavicular normal. Neurologic cannot be assessed  Lab Results Recent Labs    01/27/21 0530 01/28/21 0305  WBC 13.4* 15.7*  HGB 8.3* 8.6*  HCT 28.5* 27.9*  NA 144 141  K 4.1 4.3  CL 112* 109  CO2 21* 21*  BUN 108* 130*  CREATININE 2.88* 3.60*      Liver Panel Recent Labs    01/27/21 0530 01/28/21 0305  ALBUMIN 2.1* 2.2*   Sedimentation Rate No results for input(s): ESRSEDRATE in the last 72 hours. C-Reactive Protein Recent Labs    01/27/21 0530 01/28/21 0305  CRP 1.1* 1.8*    Microbiology:  Studies/Results:  DG Chest Port 1 View  Result Date: 01/28/2021 CLINICAL DATA:  Respiratory failure with hypoxia. EXAM: PORTABLE CHEST 1 VIEW COMPARISON:  01/25/2021. FINDINGS: Endotracheal tube and NG tube in stable position. Cardiomegaly. Low lung volumes. Diffuse bilateral pulmonary infiltrates/edema again noted without interim change. Small right pleural effusion cannot be excluded. No pneumothorax. IMPRESSION: 1. Lines and tubes in stable position. 2. Cardiomegaly. 3. Low lung volumes. Diffuse bilateral pulmonary infiltrates/edema again noted without interim change. Small right pleural effusion cannot be excluded. Electronically Signed   By: Marcello Moores  Register   On: 01/28/2021 05:29      Assessment/Plan: Covid pneumonia with acute hypoxic respiratory failure.  Intubated AKI: Worsening BUN is very high so could be a component of prerenal.  She will need free water  Patient is currently off antibiotics into 48 hours after being on it since admission.  Initially was on Levaquin for 4 days and then cefepime.  No fever since 01/26/2021.  Pink frothy secretion from the lungs.  Rule out ARDS, PE, CHF  cultures have been sent.  Recommend bronchoscopy  Morbid obesity  Diabetes mellitus with hyperglycemia Management as per primary team Discussed the management with the nurse.

## 2021-01-28 NOTE — Consult Note (Signed)
ANTICOAGULATION CONSULT NOTE   Pharmacy Consult for Lovenox dosing Indication: atrial fibrillation  Patient Measurements: Height: 5\' 7"  (170.2 cm) Weight: (!) 186.9 kg (412 lb) IBW/kg (Calculated) : 61.6  Labs: Recent Labs    01/26/21 0436 01/27/21 0530 01/27/21 1017 01/28/21 0305 01/28/21 1058  HGB 9.0* 8.3*  --  8.6*  --   HCT 30.4* 28.5*  --  27.9*  --   PLT 308 286  --  306  --   HEPRLOWMOCWT  --   --  0.84  --  1.85  CREATININE 1.93* 2.88*  --  3.60*  --     Estimated Creatinine Clearance: 31.1 mL/min (A) (by C-G formula based on SCr of 3.6 mg/dL (H)).   Medical History: Past Medical History:  Diagnosis Date  . Arthritis    hips  . Diabetes mellitus without complication (HCC)    type 2  . Family history of adverse reaction to anesthesia    mother has ponv  . Goiter    followed by dr gerkin q year  . Hypertension   . IBS (irritable bowel syndrome)   . PONV (postoperative nausea and vomiting)    ponv 1 day after last dental surgery    Assessment: Pt is a 55 y.o. F presenting to the hospital due to acute respiratory failure with hypoxia secondary to COVID-19 pneumonia. PMH includes HTN, T2DM, and IBS. During sedation adjustments, pt developed A.fib RVR, with no PMH of A.fib on file. She was initiated on amiodarone bolus and drip. Pharmacy has been consulted for Lovenox dosing.  Pt renal function worsening (1.93 >>2.88 >>3.60). Pt is also fluid overloaded. Given worsening renal function, low threshold to switch anticoagulation to IV heparin or apixaban. Concerns with IV heparin include worsening volume overload. Concerns with apixaban include lack of easy reversal. Discussed on rounds.   Date LMWH level (4 hr) Comment  2/16 at 1017 0.84 Therapeutic  2/17 at 1058 1.85 Supratherapeutic     Goal of Therapy:  Anti-Xa level 0.6-1 units/ml 4hrs after LMWH dose given Monitor platelets by anticoagulation protocol: Yes   Plan:  --Adjust Lovenox to 190 mg q24h  (1mg /kg, 189.6 mg rounded to 190 mg) -- CrCl 31.1 mL today. Suspect overestimates true renal function due to body habitus. Normalized CrCl= ~20 mL/min.  --Will reassess again tomorrow  3/17, PharmD Student 01/28/2021,12:10 PM

## 2021-01-28 NOTE — Progress Notes (Signed)
[facility]  ROUNDING NOTE   Subjective:   UOP 679m Creatinine 3.6 (2.88) (1.93)    Remains critically ill.   Objective:  Vital signs in last 24 hours:  Temp:  [97.52 F (36.4 C)-100.04 F (37.8 C)] 99.68 F (37.6 C) (02/17 0600) Pulse Rate:  [38-132] 132 (02/17 0843) Resp:  [17-33] 19 (02/17 0600) BP: (117-161)/(49-97) 127/61 (02/17 1300) SpO2:  [91 %-97 %] 94 % (02/17 0737) FiO2 (%):  [40 %-50 %] 40 % (02/17 0800) Weight:  [186.9 kg] 186.9 kg (02/17 0334)  Weight change:  Filed Weights   01/22/21 0445 01/25/21 0500 01/28/21 0334  Weight: (!) 183.7 kg (!) 189.6 kg (!) 186.9 kg    Intake/Output: I/O last 3 completed shifts: In: 4244.2 [I.V.:2064.2; Other:250; NG/GT:1830; IV Piggyback:100] Out: 18478[Urine:970; Stool:810]   Intake/Output this shift:  Total I/O In: 200 [NG/GT:200] Out: 300 [Urine:300]  Physical Exam: General: Critically ill  Head: ETT  Eyes: Anicteric   Neck:  trachea midline  Lungs:   PRVC FiO2 40%   Heart: Tachycardia, irregular  Abdomen:  Soft, nontender, obese  Extremities:  trace peripheral edema.  Neurologic: Intubated and sedated  Skin: No lesions  Access: none    Basic Metabolic Panel: Recent Labs  Lab 01/24/21 0435 01/25/21 0510 01/26/21 0436 01/27/21 0530 01/28/21 0305  NA 141 143 143 144 141  K 5.2* 4.5 3.7 4.1 4.3  CL 107 107 111 112* 109  CO2 25 24 23  21* 21*  GLUCOSE 283* 249* 160* 160* 210*  BUN 74* 87* 96* 108* 130*  CREATININE 1.33* 1.68* 1.93* 2.88* 3.60*  CALCIUM 9.0 9.5 9.7 9.7 10.0  MG 2.2 2.2 2.5* 2.5* 2.6*  PHOS 7.3* 6.3* 5.2* 6.4* 6.9*    Liver Function Tests: Recent Labs  Lab 01/24/21 0435 01/25/21 0510 01/26/21 0436 01/27/21 0530 01/28/21 0305  ALBUMIN 2.3* 2.3* 2.2* 2.1* 2.2*   No results for input(s): LIPASE, AMYLASE in the last 168 hours. No results for input(s): AMMONIA in the last 168 hours.  CBC: Recent Labs  Lab 01/24/21 0435 01/25/21 0510 01/26/21 0436  01/27/21 0530 01/28/21 0305  WBC 11.7* 12.9* 14.0* 13.4* 15.7*  NEUTROABS 9.1* 9.8* 9.4* 8.2* 11.4*  HGB 10.7* 9.8* 9.0* 8.3* 8.6*  HCT 35.6* 31.8* 30.4* 28.5* 27.9*  MCV 89.4 88.3 88.6 89.6 86.9  PLT 321 282 308 286 306    Cardiac Enzymes: No results for input(s): CKTOTAL, CKMB, CKMBINDEX, TROPONINI in the last 168 hours.  BNP: Invalid input(s): POCBNP  CBG: Recent Labs  Lab 01/27/21 2029 01/28/21 0009 01/28/21 0250 01/28/21 0725 01/28/21 1115  GLUCAP 179* 199* 185* 215* 217*    Microbiology: Results for orders placed or performed during the hospital encounter of 01/30/2021  Blood Culture (routine x 2)     Status: None   Collection Time: 01/28/2021  2:56 PM   Specimen: BLOOD  Result Value Ref Range Status   Specimen Description BLOOD BLOOD LEFT ARM  Final   Special Requests   Final    BOTTLES DRAWN AEROBIC AND ANAEROBIC Blood Culture results may not be optimal due to an inadequate volume of blood received in culture bottles   Culture   Final    NO GROWTH 5 DAYS [facility] [address], [place] [address]   Report Status 01/19/2021 FINAL  Final  Blood Culture (routine x 2)     Status: None   Collection Time: 02/03/2021  2:59 PM   Specimen: BLOOD  Result Value Ref Range Status   Specimen Description BLOOD BLOOD LEFT FOREARM  Final   Special Requests   Final    BOTTLES DRAWN AEROBIC AND ANAEROBIC Blood Culture adequate volume   Culture   Final    NO GROWTH 5 DAYS Performed at [facility], [address] [place], [place] [address]    Report Status 01/19/2021 FINAL  Final  Culture, respiratory     Status: None   Collection Time: 01/15/21  5:50 PM   Specimen: Tracheal Aspirate; Respiratory  Result Value Ref Range Status   Specimen Description   Final    TRACHEAL ASPIRATE Performed at [facility], [address] [place], [place] [address]    Special Requests   Final    NONE Performed at [facility] [address] [place], [place] [address]    Gram Stain   Final    FEW WBC PRESENT, PREDOMINANTLY PMN FEW GRAM NEGATIVE RODS FEW GRAM POSITIVE RODS RARE GRAM POSITIVE COCCI    Culture   Final    Normal respiratory flora-no Staph aureus or Pseudomonas seen Performed at [facility], [place] [address], [place], [place] [address]    Report Status 01/18/2021 FINAL  Final  Culture, respiratory (non-expectorated)     Status: None   Collection Time: 01/19/21  6:20 PM   Specimen: Tracheal Aspirate; Respiratory  Result Value Ref Range Status   Specimen Description   Final    TRACHEAL ASPIRATE Performed at [facility], [address], [place], [place] [address]    Special Requests   Final    NONE Performed at [facility], [address] [place], [place] [address]    Gram Stain   Final    ABUNDANT WBC PRESENT, PREDOMINANTLY PMN FEW GRAM POSITIVE COCCI RARE GRAM NEGATIVE RODS RARE GRAM POSITIVE RODS    Culture   Final    Normal respiratory flora-no Staph aureus or Pseudomonas seen Performed at [facility], [place] [address] [place], [place] [address]    Report Status 01/22/2021 FINAL  Final  CULTURE, BLOOD (ROUTINE X 2) w Reflex to ID Panel     Status: Abnormal   Collection Time: 01/19/21  6:45 PM   Specimen: BLOOD  Result Value Ref Range Status   Specimen Description   Final    BLOOD BLOOD LEFT HAND Performed at [facility], [address] [place], [place] [address]    Special Requests   Final    BOTTLES DRAWN AEROBIC AND ANAEROBIC Blood Culture adequate volume Performed at [facility], [address] [place], [place] [address]    Culture  Setup Time   Final    GRAM POSITIVE COCCI IN BOTH AEROBIC AND ANAEROBIC BOTTLES CRITICAL RESULT CALLED TO, READ BACK BY AND VERIFIED WITH: BRANDON BEERS AT 5615 ON 01/20/21 BY SS    Culture (A)  Final    STAPHYLOCOCCUS EPIDERMIDIS THE SIGNIFICANCE OF ISOLATING THIS ORGANISM  FROM A SINGLE SET OF BLOOD CULTURES WHEN MULTIPLE SETS ARE DRAWN IS UNCERTAIN. PLEASE NOTIFY THE MICROBIOLOGY DEPARTMENT WITHIN ONE WEEK IF SPECIATION AND SENSITIVITIES ARE REQUIRED. Performed at [facility], [place] [address], [place],  [address]    Report Status 01/23/2021 FINAL  Final  Blood Culture ID Panel (Reflexed)     Status: Abnormal   Collection Time: 01/19/21  6:45 PM  Result Value Ref Range Status   Enterococcus faecalis NOT DETECTED NOT DETECTED Final   Enterococcus  Faecium NOT DETECTED NOT DETECTED Final   Listeria monocytogenes NOT DETECTED NOT DETECTED Final   Staphylococcus species DETECTED (A) NOT DETECTED Final    Comment: CRITICAL RESULT CALLED TO, READ BACK BY AND VERIFIED WITH: BRANDON BEERS AT 1539 ON 01/20/21 BY SS    Staphylococcus aureus (BCID) NOT DETECTED NOT DETECTED Final   Staphylococcus epidermidis DETECTED (A) NOT DETECTED Final    Comment: Methicillin (oxacillin) resistant coagulase negative staphylococcus. Possible blood culture contaminant (unless isolated from more than one blood culture draw or clinical case suggests pathogenicity). No antibiotic treatment is indicated for blood  culture contaminants. CRITICAL RESULT CALLED TO, READ BACK BY AND VERIFIED WITH: BRANDON BEERS AT 3818 ON 01/20/21 BY SS    Staphylococcus lugdunensis NOT DETECTED NOT DETECTED Final   Streptococcus species NOT DETECTED NOT DETECTED Final   Streptococcus agalactiae NOT DETECTED NOT DETECTED Final   Streptococcus pneumoniae NOT DETECTED NOT DETECTED Final   Streptococcus pyogenes NOT DETECTED NOT DETECTED Final   A.calcoaceticus-baumannii NOT DETECTED NOT DETECTED Final   Bacteroides fragilis NOT DETECTED NOT DETECTED Final   Enterobacterales NOT DETECTED NOT DETECTED Final   Enterobacter cloacae complex NOT DETECTED NOT DETECTED Final   Escherichia coli NOT DETECTED NOT DETECTED Final   Klebsiella aerogenes NOT DETECTED NOT DETECTED Final   Klebsiella oxytoca NOT  DETECTED NOT DETECTED Final   Klebsiella pneumoniae NOT DETECTED NOT DETECTED Final   Proteus species NOT DETECTED NOT DETECTED Final   Salmonella species NOT DETECTED NOT DETECTED Final   Serratia marcescens NOT DETECTED NOT DETECTED Final   Haemophilus influenzae NOT DETECTED NOT DETECTED Final   Neisseria meningitidis NOT DETECTED NOT DETECTED Final   Pseudomonas aeruginosa NOT DETECTED NOT DETECTED Final   Stenotrophomonas maltophilia NOT DETECTED NOT DETECTED Final   Candida albicans NOT DETECTED NOT DETECTED Final   Candida auris NOT DETECTED NOT DETECTED Final   Candida glabrata NOT DETECTED NOT DETECTED Final   Candida krusei NOT DETECTED NOT DETECTED Final   Candida parapsilosis NOT DETECTED NOT DETECTED Final   Candida tropicalis NOT DETECTED NOT DETECTED Final   Cryptococcus neoformans/gattii NOT DETECTED NOT DETECTED Final   Methicillin resistance mecA/C DETECTED (A) NOT DETECTED Final    Comment: CRITICAL RESULT CALLED TO, READ BACK BY AND VERIFIED WITH: BRANDON BEERS AT 1539 ON 01/20/21 BY SS Performed at [facility], [address] [place], [place] [address]   CULTURE, BLOOD (ROUTINE X 2) w Reflex to ID Panel     Status: None   Collection Time: 01/19/21  6:47 PM   Specimen: BLOOD  Result Value Ref Range Status   Specimen Description BLOOD BLOOD RIGHT HAND  Final   Special Requests   Final    BOTTLES DRAWN AEROBIC AND ANAEROBIC Blood Culture adequate volume   Culture   Final    NO GROWTH 5 DAYS Performed at [facility], [address] [place], [place] [address]    Report Status 01/24/2021 FINAL  Final  MRSA PCR Screening     Status: None   Collection Time: 01/20/21  7:58 AM   Specimen: Nasal Mucosa; Nasopharyngeal  Result Value Ref Range Status   MRSA by PCR NEGATIVE NEGATIVE Final    Comment:        The GeneXpert MRSA Assay (FDA approved for NASAL specimens only), is one component of a comprehensive MRSA colonization surveillance  program. It is not intended to diagnose MRSA infection nor to guide or monitor treatment for MRSA infections. Performed at [facility], [place], [place] [address]   CULTURE, BLOOD (ROUTINE X 2) w Reflex to ID Panel     Status: None   Collection Time: 01/22/21  1:02 PM   Specimen: BLOOD  Result Value Ref Range Status   Specimen Description BLOOD BLOOD LEFT HAND  Final   Special Requests   Final    BOTTLES DRAWN AEROBIC AND ANAEROBIC Blood Culture adequate volume   Culture   Final    NO GROWTH 5 DAYS Performed at [facility], [address] [place], [place] [address]    Report Status 01/27/2021 FINAL  Final  CULTURE, BLOOD (ROUTINE X 2) w Reflex to ID Panel     Status: None   Collection Time: 01/22/21  1:02 PM   Specimen: BLOOD  Result Value Ref Range Status   Specimen Description BLOOD BLOOD RIGHT HAND  Final   Special Requests   Final    BOTTLES DRAWN AEROBIC AND ANAEROBIC Blood Culture adequate volume   Culture   Final    NO GROWTH 5 DAYS Performed at [facility], [address] [place], [place] [address]    Report Status 01/27/2021 FINAL  Final  Culture, respiratory (non-expectorated)     Status: None   Collection Time: 01/25/21 10:30 AM   Specimen: Tracheal Aspirate; Respiratory  Result Value Ref Range Status   Specimen Description   Final    TRACHEAL ASPIRATE Performed at [facility], [address], [place], [place] [address]    Special Requests   Final    NONE Performed at [facility], [address] [place], [place] [address]    Gram Stain   Final    MODERATE WBC PRESENT,BOTH PMN AND MONONUCLEAR NO ORGANISMS SEEN Performed at [facility], [place] [address], [place],  [address]    Culture FEW CANDIDA ALBICANS  Final   Report Status 01/28/2021 FINAL  Final    Coagulation Studies: No results for input(s): LABPROT, INR in the last 72 hours.  Urinalysis: No results for  input(s): COLORURINE, LABSPEC, PHURINE, GLUCOSEU, HGBUR, BILIRUBINUR, KETONESUR, PROTEINUR, UROBILINOGEN, NITRITE, LEUKOCYTESUR in the last 72 hours.  Invalid input(s): APPERANCEUR    Imaging: DG Chest Port 1 View  Result Date: 01/28/2021 CLINICAL DATA:  Respiratory failure with hypoxia. EXAM: PORTABLE CHEST 1 VIEW COMPARISON:  01/25/2021. FINDINGS: Endotracheal tube and NG tube in stable position. Cardiomegaly. Low lung volumes. Diffuse bilateral pulmonary infiltrates/edema again noted without interim change. Small right pleural effusion cannot be excluded. No pneumothorax. IMPRESSION: 1. Lines and tubes in stable position. 2. Cardiomegaly. 3. Low lung volumes. Diffuse bilateral pulmonary infiltrates/edema again noted without interim change. Small right pleural effusion cannot be excluded. Electronically Signed   By: Marcello Moores  Register   On: 01/28/2021 05:29     Medications:   . sodium chloride Stopped (01/28/21 0251)  . amiodarone 30 mg/hr (01/28/21 0400)  . dexmedetomidine (PRECEDEX) IV infusion 0.5 mcg/kg/hr (01/28/21 1258)  . famotidine (PEPCID) IV Stopped (01/27/21 2128)  . feeding supplement (NEPRO CARB STEADY) 1,000 mL (01/28/21 0933)  . fentaNYL infusion INTRAVENOUS 100 mcg/hr (01/28/21 1257)  . midazolam Stopped (01/26/21 1625)   . chlorhexidine gluconate (MEDLINE KIT)  15 mL Mouth Rinse BID  . Chlorhexidine Gluconate Cloth  6 each Topical Daily  . diltiazem  30 mg Per Tube Q6H  . [START ON 01/17/2021] enoxaparin (LOVENOX) injection  1 mg/kg Subcutaneous Q24H  . feeding supplement (PROSource TF)  45 mL Per Tube BID  .  Gerhardt's butt cream   Topical QID  . insulin aspart  0-20 Units Subcutaneous Q4H  . insulin aspart  5 Units Subcutaneous Q4H  . insulin detemir  22 Units Subcutaneous Daily  . mouth rinse  15 mL Mouth Rinse 10 times per day  . metoprolol tartrate  100 mg Per Tube BID  . sodium chloride flush  10-40 mL Intracatheter Q12H  . sodium chloride flush  10-40 mL  Intracatheter Q12H  . sodium chloride flush  3 mL Intravenous Q12H   sodium chloride, acetaminophen (TYLENOL) oral liquid 160 mg/5 mL, acetaminophen, albuterol, guaiFENesin-dextromethorphan, midazolam, ondansetron (ZOFRAN) IV, sodium chloride flush, sodium chloride flush, sodium chloride flush  Assessment/ Plan:  Dana Bruce is a 55 y.o. white female with hypertension, IBS, diabetes mellitus type II, who was admitted to [facility] on 01/13/2021 for Acute respiratory failure with hypoxia ([place]) [J96.01] Pneumonia due to COVID-19 virus [U07.1, J12.82] COVID-19 [U07.1]  Patient remains intubated and requiring mechanical ventilation. Nephrology consulted for persistent hyperkalemia  1. Hyperkalemia: with acute kidney injury. Potassium now at goal Status post lokelma and bumex - Evaluate daily for diuretics.  - Hold home medication of telmisartan.   2. Acute kidney injury:  Nonoliguric urine output. Responding to diuretics. No acute indication for dialysis at this time. Continue to monitor volume status, serum electrolytes, renal function and urine output.   3. Hypertension: fluid driven. With atrial fibrillation. Blood pressure now at goal. Holding amlodipine and metoprolol Started on amiodarone.   4. 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 On metformin. Hemoglobin A1c at goal at 6.3%.  - holding metformin.   5. Acute respiratory failure: requiring intubation and mechanical ventilation. COVID-19 infection, ARDS.    LOS: 53 Breindel Collier 2/17/20221:15 PM

## 2021-01-28 NOTE — Progress Notes (Signed)
Pt remains on amiodarone gtt, HR runs between 110s-120s and continues to have runs of a-fib. Per assessment pt had unequal pupils, left is a size 5 mm and right is a size 4 mm, both are round and reactive (brisk), MD made aware. PT remains on 50% FiO2, with O2 stats >92%, fentaynl and precedex for sedation. MASD with slight bleeding was noted on skin assessment and wound consult was placed. Pt remained afebrile throughout the entire night and is stable at this time.

## 2021-01-28 NOTE — Progress Notes (Signed)
NAME:  Dana Bruce, MRN:  [id number], DOB:  04/06/1966, LOS: 46 ADMISSION DATE:  02/06/2021, INITIAL CONSULTATION DATE:  02/06/2021 REFERRING MD:  Dr. Jari Pigg,  CHIEF COMPLAINT: Shortness of breath  Brief History:  55 year old unvaccinated female admitted with acute hypoxic respiratory failure in the setting of COVID-19 pneumonia   Subjective Findings:  01/15/2021- patient with worsening resp distress s/p ETT.  Has been started on lasix. Due to BMI >62 will hold proning. Patient with resp failure s/p ETT - I have updated husband Dana Bruce via phone today. 01/16/2021- patient was awake and had attempted to self extubate, sedation was increased. Midline was placed due to inadequate access no need for central line at this time. AM labs with worsening GFR, hyperglycemia.  Vitals stable. UOP adequate appx 1L overnight, CXR with bilateral multifocal infiltrates. Ventilator management performed.  01/17/2021- patient has been weaned on MV from FIO2-100% to 70%, repeat ABG this afternoon.  Called husband today (POA Dana Bruce) reviewed care plan and answered questions.  01/19/2021-appears volume overloaded, still with increased work of breathing when sedation is lightened 2/9FAILURE TO WEAN FROM VENT, SEVERE ALI 2/10 severe resp failure, failure to wean from vent 2/11 failed weaning trials +hyperkalemia 2/12 severe resp failure, failed weaning trials, PICC line placed+hyperkalemia 2/13 severe resp failure 01/27/2021- Patient is worsening, renal function declining nephrology consultation today, she is febrile remains critically ill on 50% fiO2, on OG Feeds with nepro, has PICC line,  Has been on numerous antibiotics and ID is following.  Goals of care discussion today with family.  UOP is only 300 overnight. Family updated today via phone.  01/28/2021- heavy frothy pink sputum per ETT. Sending tracheal aspirate. Called POA husband was unable to reach him today. Reviewed and updated medications with  phramD   Past Medical History:  Hypertension Diabetes mellitus Arthritis Goiter IBS    Consults:  PCCM  Procedures:  N/A  Significant Diagnostic Tests:  2/3: Chest x-ray>>IMPRESSION: Diffuse bilateral ground-glass airspace opacities consistent with the patient's history of viral pneumonia. 2/8: Chest x-ray>> improved aeration from prior  Micro Data:  1/30: SARS-CoV-2 PCR>> positive 2/3: Blood culture x2>> 2/3: Sputum>> 2/3: Strep pneumo urinary antigen>> 2/3: Legionella urinary antigen>>  Antimicrobials:  Remdesivir 2/3>> 2/8 Levaquin 2/3>>   Objective   Blood pressure (!) 157/70, pulse (!) 132, temperature 99.68 F (37.6 C), resp. rate 19, height 5' 7"  (1.702 m), weight (!) 186.9 kg, SpO2 94 %.    Vent Mode: PRVC FiO2 (%):  [40 %-50 %] 40 % Set Rate:  [15 bmp] 15 bmp Vt Set:  [500 mL] 500 mL PEEP:  [5 cmH20] 5 cmH20   Intake/Output Summary (Last 24 hours) at 01/28/2021 0926 Last data filed at 01/28/2021 2878 Gross per 24 hour  Intake 2005.08 ml  Output 1720 ml  Net 285.08 ml   Filed Weights   01/22/21 0445 01/25/21 0500 01/28/21 0334  Weight: (!) 183.7 kg (!) 189.6 kg (!) 186.9 kg    Examination: Physical examination is limited due to need for PPE/CAPR General: Morbidly obese Age appropreiate.  On MV sedated, a synchronous with vent when sedation lightened HENT: Atraumatic, normocephalic, sclera edema, neck supple, trachea midline, no crepitus Lungs: Coarse breath sounds with occasional rhonchi Cardiovascular: Monitor sinus rhythm Abdomen: severely obese, protuberant, nondistended, soft  Extremities: No deformities, +2 anasarca Neuro: Sedated, opens eyes but does not track does not follow commands. GU: Foley in place. Skin: Warm and dry.  No obvious rashes, lesions, ulcerations  IMAGING  Assessment & Plan:   Acute Hypoxic Respiratory Failure in the setting of COVID-19 Pneumonia -Supplemental O2 as needed to maintain O2 saturations  greater than 88% -On MV - PRVC 40% with weaning per RT -Full ventilator support -follow  ARDS net protocol high PEEP ladder -PEEP minimum 8 due to body habitus -Remdesivir, plan for 5 days>> completed -Steroids (Solu-Medrol 60 mg twice daily)>>>completed  -Follow inflammatory markers: Ferritin, D-dimer, CRP -Vitamin C, zinc -Respiratory cultures negative -Bronchodilators via MDI vent adapter -Maintain euvolemia to net negative fluid balance as able -Unable to prone due to body habitus (BMI 62) -Maintain airborne and contact precautions      Diabetes mellitus with severe hyperglycemia -CBGs -Sliding scale insulin -Follow ICU hypo/hyperglycemia protocol   Acute kidney Injury Stage 5  - likely due to critical illness with probable ATN  Morbid obesity BMI 62 This issue adds complexity to her management Unable to prone due to body habitus Will make weaning challenging PEEP minimum of 8 due to body habitus to avoid derecruitment         Best practice (evaluated daily)  Diet: On trickle feeds, tolerating, adjust as Propofol d/c'd Pain/Anxiety/Delirium protocol (if indicated): Precedex/fentanyl, propofol being weaned off VAP protocol (if indicated): Yes, active DVT prophylaxis: Lovenox subcu GI prophylaxis: Pepcid Glucose control: Sliding scale insulin Mobility: As tolerated Disposition: ICU  Goals of Care:   Code Status: Full code  Labs   CBC: Recent Labs  Lab 01/24/21 0435 01/25/21 0510 01/26/21 0436 01/27/21 0530 01/28/21 0305  WBC 11.7* 12.9* 14.0* 13.4* 15.7*  NEUTROABS 9.1* 9.8* 9.4* 8.2* 11.4*  HGB 10.7* 9.8* 9.0* 8.3* 8.6*  HCT 35.6* 31.8* 30.4* 28.5* 27.9*  MCV 89.4 88.3 88.6 89.6 86.9  PLT 321 282 308 286 375    Basic Metabolic Panel: Recent Labs  Lab 01/24/21 0435 01/25/21 0510 01/26/21 0436 01/27/21 0530 01/28/21 0305  NA 141 143 143 144 141  K 5.2* 4.5 3.7 4.1 4.3  CL 107 107 111 112* 109  CO2 25 24 23  21* 21*  GLUCOSE 283* 249* 160* 160*  210*  BUN 74* 87* 96* 108* 130*  CREATININE 1.33* 1.68* 1.93* 2.88* 3.60*  CALCIUM 9.0 9.5 9.7 9.7 10.0  MG 2.2 2.2 2.5* 2.5* 2.6*  PHOS 7.3* 6.3* 5.2* 6.4* 6.9*   GFR: Estimated Creatinine Clearance: 31.1 mL/min (A) (by C-G formula based on SCr of 3.6 mg/dL (H)). Recent Labs  Lab 01/25/21 0510 01/26/21 0436 01/27/21 0530 01/28/21 0305  PROCALCITON  --   --  0.30  --   WBC 12.9* 14.0* 13.4* 15.7*    Liver Function Tests: Recent Labs  Lab 01/24/21 0435 01/25/21 0510 01/26/21 0436 01/27/21 0530 01/28/21 0305  ALBUMIN 2.3* 2.3* 2.2* 2.1* 2.2*   No results for input(s): LIPASE, AMYLASE in the last 168 hours. No results for input(s): AMMONIA in the last 168 hours.  ABG    Component Value Date/Time   PHART 7.33 (L) 01/18/2021 0428   PCO2ART 53 (H) 01/18/2021 0428   PO2ART 89 01/18/2021 0428   HCO3 27.9 01/18/2021 0428   ACIDBASEDEF 0.8 01/15/2021 1133   O2SAT 96.2 01/18/2021 0428     Coagulation Profile: No results for input(s): INR, PROTIME in the last 168 hours.  Cardiac Enzymes: No results for input(s): CKTOTAL, CKMB, CKMBINDEX, TROPONINI in the last 168 hours.  HbA1C: Hgb A1c MFr Bld  Date/Time Value Ref Range Status  01/20/2021 02:41 PM 6.3 (H) 4.8 - 5.6 % Final    Comment:    (  NOTE) Pre diabetes:          5.7%-6.4%  Diabetes:              >6.4%  Glycemic control for   <7.0% adults with diabetes     CBG: Recent Labs  Lab 01/27/21 1516 01/27/21 2029 01/28/21 0009 01/28/21 0250 01/28/21 0725  GLUCAP 213* 179* 199* 185* 215*    Review of Systems:    Unable to complete while on MV sedated to RASS-3   Allergies Allergies  Allergen Reactions  . Lasix [Furosemide] Itching  . Penicillins Hives and Itching    Has patient had a PCN reaction causing immediate rash, facial/tongue/throat swelling, SOB or lightheadedness with hypotension: yes Has patient had a PCN reaction causing severe rash involving mucus membranes or skin necrosis: no Has  patient had a PCN reaction that required hospitalization No Has patient had a PCN reaction occurring within the last 10 years: No If all of the above answers are "NO", then may proceed with Cephalosporin use.   [place] Flavor [Flavoring Agent] Itching, Nausea And Vomiting and Rash    Breaks out in places where she touches strawberries      Current medications  Scheduled Meds: . amLODipine  10 mg Per Tube Daily  . chlorhexidine gluconate (MEDLINE KIT)  15 mL Mouth Rinse BID  . Chlorhexidine Gluconate Cloth  6 each Topical Daily  . enoxaparin (LOVENOX) injection  1 mg/kg Subcutaneous Q12H  . feeding supplement (PROSource TF)  45 mL Per Tube BID  . Gerhardt's butt cream   Topical QID  . insulin aspart  0-20 Units Subcutaneous Q4H  . insulin aspart  5 Units Subcutaneous Q4H  . insulin detemir  22 Units Subcutaneous Daily  . mouth rinse  15 mL Mouth Rinse 10 times per day  . metoprolol tartrate  100 mg Per Tube BID  . sodium chloride flush  10-40 mL Intracatheter Q12H  . sodium chloride flush  10-40 mL Intracatheter Q12H  . sodium chloride flush  3 mL Intravenous Q12H   Continuous Infusions: . sodium chloride Stopped (01/28/21 0251)  . amiodarone 30 mg/hr (01/28/21 0400)  . dexmedetomidine (PRECEDEX) IV infusion 0.5 mcg/kg/hr (01/28/21 0836)  . famotidine (PEPCID) IV Stopped (01/27/21 2128)  . feeding supplement (NEPRO CARB STEADY) 1,000 mL (01/27/21 1301)  . fentaNYL infusion INTRAVENOUS 75 mcg/hr (01/28/21 0400)  . midazolam Stopped (01/26/21 1625)   PRN Meds:.sodium chloride, acetaminophen (TYLENOL) oral liquid 160 mg/5 mL, acetaminophen, albuterol, guaiFENesin-dextromethorphan, labetalol, midazolam, ondansetron (ZOFRAN) IV, sodium chloride flush, sodium chloride flush, sodium chloride flush    Critical care time: 33 minutes    Critical care provider statement:    Critical care time (minutes):  33   Critical care time was exclusive of:  Separately billable procedures and   treating other patients   Critical care was necessary to treat or prevent imminent or  life-threatening deterioration of the following conditions:  acute hypoxemic respiratory failure due to COVID19, mobid obesity, aki, multiple comorbid conditinos   Critical care was time spent personally by me on the following  activities:  Development of treatment plan with patient or surrogate,  discussions with consultants, evaluation of patient's response to  treatment, examination of patient, obtaining history from patient or  surrogate, ordering and performing treatments and interventions, ordering  and review of laboratory studies and re-evaluation of patient's condition   I assumed direction of critical care for this patient from another  provider in my specialty: no  Multidisciplinary rounds were performed with the ICU team.  Prognosis is guarded.     Ottie Glazier, M.D.  [facility]    *This note was dictated using voice recognition software/Dragon.  Despite best efforts to proofread, errors can occur which can change the meaning.  Any change was purely unintentional.

## 2021-01-28 NOTE — Consult Note (Signed)
PHARMACY CONSULT NOTE  Pharmacy Consult for Electrolyte Monitoring and Replacement   Recent Labs: Potassium (mmol/L)  Date Value  01/28/2021 4.3   Magnesium (mg/dL)  Date Value  09/81/1914 2.6 (H)   Calcium (mg/dL)  Date Value  78/29/5621 10.0   Albumin (g/dL)  Date Value  30/86/5784 2.2 (L)   Phosphorus (mg/dL)  Date Value  69/62/9528 6.9 (H)   Sodium (mmol/L)  Date Value  01/28/2021 141   Assessment: 55 yo F with COVID-19 presenting with acute hypoxic respiratory failure in the setting of COVID-19 pneumonia. PMH includes HTN, diabetes, and IBS. Pt intubated 2/4. Pharmacy consulted for electrolyte monitoring and replacement.  Nutrition: Tube feeds + free water flushes 200 mL q4h (1.2 L/day)  Goal of Therapy:  - K >4 - Mag >2 - all other electrolytes WNL  Plan:  2/17 AM labs:  K 4.3  Mag 2.6  Scr 3.6 (worsening) - No electrolyte replacement needed today - Will follow-up electrolytes with tomorrow's AM labs  Gloriann Loan, PharmD Student 01/28/2021 8:57 AM

## 2021-01-28 NOTE — Consult Note (Signed)
WOC Nurse Consult Note: Reason for Consult: Moisture associated skin damage (MASD), specifically irritant contact dermatitis (ICD) related to incontinence, fecal and urinary effluent and Intertriginous dermatitis (ITD) in the bilateral nguinal areas Wound type:Moisture Pressure Injury POA: N/A Measurement: N/A. Linear erythematous presentation to bilateral groin areas with split skin in the base.  Buttocks with scattered partial thickness areas of skin loss. Wound JIR:CVEL, moist Drainage (amount, consistency, odor) scant, serous Periwound: macerated Dressing procedure/placement/frequency: POC provided for areas noted above to include Gerhart's Butt cream, a 1:1:1 compounded prescriptive consisting of hydrocortisone cream:lotrimin cream:zinc oxide to the buttocks and perineal area as well as the medial and posterior thighs.  The inguinal areas will be treated with our house antimicrobial wicking textile, InterDry.  The patient is on a mattress replacement with low air loss feature and she is being turned and repositioned per protocol. I have also provided bilateral pressure redistribution heel boots to prevent pressure injury in those areas.  WOC nursing team will not follow, but will remain available to this patient, the nursing and medical teams.  Please re-consult if needed. Thanks, Ladona Mow, MSN, RN, GNP, [facility]  Pager# [phone]

## 2021-01-28 NOTE — Progress Notes (Signed)
Shift note: Pt afebrile this shift, had several runs of Afib, pulses 90's -140's. Pt noted with increased respirations, and heart rate with any movement, change of position and ADL care. Moderate amount of pink tinged sputum draining from mouth and nose at times. Flexiseal removed this shift. It was noted in the bed, popped out immediately after being replaced X2. She had 1 large loose and 1 small bm this shift. Pt awaked from sedation X2 separate instances during care.  Spontaneously opened her eyes, did not follow commands. Began belly brreathing with increated respirations, lowered 02 sats and elevated heart rate. Sedation increased to calm pt and maintain RASS.at -2, -3. Marland Kitchen

## 2021-01-29 ENCOUNTER — Encounter: Admission: EM | Disposition: E | Payer: Self-pay | Source: Home / Self Care | Attending: Internal Medicine

## 2021-01-29 DIAGNOSIS — J1282 Pneumonia due to coronavirus disease 2019: Secondary | ICD-10-CM | POA: Diagnosis not present

## 2021-01-29 DIAGNOSIS — Z7189 Other specified counseling: Secondary | ICD-10-CM

## 2021-01-29 DIAGNOSIS — U071 COVID-19: Secondary | ICD-10-CM

## 2021-01-29 DIAGNOSIS — E669 Obesity, unspecified: Secondary | ICD-10-CM

## 2021-01-29 DIAGNOSIS — N179 Acute kidney failure, unspecified: Secondary | ICD-10-CM

## 2021-01-29 DIAGNOSIS — J9601 Acute respiratory failure with hypoxia: Secondary | ICD-10-CM | POA: Diagnosis not present

## 2021-01-29 DIAGNOSIS — E119 Type 2 diabetes mellitus without complications: Secondary | ICD-10-CM

## 2021-01-29 HISTORY — PX: TEMPORARY DIALYSIS CATHETER: CATH118312

## 2021-01-29 LAB — GLUCOSE, CAPILLARY
Glucose-Capillary: 166 mg/dL — ABNORMAL HIGH (ref 70–99)
Glucose-Capillary: 195 mg/dL — ABNORMAL HIGH (ref 70–99)
Glucose-Capillary: 180 mg/dL — ABNORMAL HIGH (ref 70–99)
Glucose-Capillary: 154 mg/dL — ABNORMAL HIGH (ref 70–99)
Glucose-Capillary: 185 mg/dL — ABNORMAL HIGH (ref 70–99)
Glucose-Capillary: 143 mg/dL — ABNORMAL HIGH (ref 70–99)

## 2021-01-29 LAB — CBC WITH DIFFERENTIAL/PLATELET
Abs Immature Granulocytes: 0.69 10*3/uL — ABNORMAL HIGH (ref 0.00–0.07)
Basophils Absolute: 0.1 10*3/uL (ref 0.0–0.1)
Basophils Relative: 0 %
Eosinophils Absolute: 0.8 10*3/uL — ABNORMAL HIGH (ref 0.0–0.5)
Eosinophils Relative: 5 %
HCT: 27.8 % — ABNORMAL LOW (ref 36.0–46.0)
Hemoglobin: 8.4 g/dL — ABNORMAL LOW (ref 12.0–15.0)
Immature Granulocytes: 4 %
Lymphocytes Relative: 12 %
Lymphs Abs: 2 10*3/uL (ref 0.7–4.0)
MCH: 26.3 pg (ref 26.0–34.0)
MCHC: 30.2 g/dL (ref 30.0–36.0)
MCV: 87.1 fL (ref 80.0–100.0)
Monocytes Absolute: 1.3 10*3/uL — ABNORMAL HIGH (ref 0.1–1.0)
Monocytes Relative: 8 %
Neutro Abs: 11.8 10*3/uL — ABNORMAL HIGH (ref 1.7–7.7)
Neutrophils Relative %: 71 %
Platelets: 323 10*3/uL (ref 150–400)
RBC: 3.19 MIL/uL — ABNORMAL LOW (ref 3.87–5.11)
RDW: 14.9 % (ref 11.5–15.5)
WBC: 16.7 10*3/uL — ABNORMAL HIGH (ref 4.0–10.5)
nRBC: 0.2 % (ref 0.0–0.2)

## 2021-01-29 LAB — HEPARIN LEVEL (UNFRACTIONATED): Heparin Unfractionated: 0.92 IU/mL — ABNORMAL HIGH (ref 0.30–0.70)

## 2021-01-29 LAB — RENAL FUNCTION PANEL
Albumin: 2.4 g/dL — ABNORMAL LOW (ref 3.5–5.0)
Anion gap: 9 (ref 5–15)
BUN: 136 mg/dL — ABNORMAL HIGH (ref 6–20)
CO2: 20 mmol/L — ABNORMAL LOW (ref 22–32)
Calcium: 9.9 mg/dL (ref 8.9–10.3)
Chloride: 109 mmol/L (ref 98–111)
Creatinine, Ser: 4.64 mg/dL — ABNORMAL HIGH (ref 0.44–1.00)
GFR, Estimated: 11 mL/min — ABNORMAL LOW (ref >60)
Glucose, Bld: 196 mg/dL — ABNORMAL HIGH (ref 70–99)
Phosphorus: 8.2 mg/dL — ABNORMAL HIGH (ref 2.5–4.6)
Potassium: 4.5 mmol/L (ref 3.5–5.1)
Sodium: 138 mmol/L (ref 135–145)

## 2021-01-29 LAB — PROTIME-INR
INR: 1.4 — ABNORMAL HIGH (ref 0.8–1.2)
Prothrombin Time: 16.9 seconds — ABNORMAL HIGH (ref 11.4–15.2)

## 2021-01-29 LAB — C-REACTIVE PROTEIN: CRP: 1.9 mg/dL — ABNORMAL HIGH (ref <1.0)

## 2021-01-29 LAB — HEPATITIS B SURFACE ANTIBODY,QUALITATIVE: Hep B S Ab: NONREACTIVE

## 2021-01-29 LAB — HEPATITIS C ANTIBODY: HCV Ab: NONREACTIVE

## 2021-01-29 LAB — MAGNESIUM: Magnesium: 2.7 mg/dL — ABNORMAL HIGH (ref 1.7–2.4)

## 2021-01-29 LAB — HEPATITIS B CORE ANTIBODY, IGM: Hep B C IgM: NONREACTIVE

## 2021-01-29 LAB — APTT: aPTT: 48 seconds — ABNORMAL HIGH (ref 24–36)

## 2021-01-29 LAB — FERRITIN: Ferritin: 105 ng/mL (ref 11–307)

## 2021-01-29 LAB — D-DIMER, QUANTITATIVE: D-Dimer, Quant: 0.67 ug/mL-FEU — ABNORMAL HIGH (ref 0.00–0.50)

## 2021-01-29 LAB — HEPATITIS B SURFACE ANTIGEN: Hepatitis B Surface Ag: NONREACTIVE

## 2021-01-29 SURGERY — TEMPORARY DIALYSIS CATHETER
Anesthesia: Moderate Sedation

## 2021-01-29 MED ORDER — AMIODARONE HCL 200 MG PO TABS: 200.0000 mg | ORAL_TABLET | Freq: Every day | ORAL | Status: DC

## 2021-01-29 MED ORDER — AMIODARONE HCL 200 MG PO TABS
400.0000 mg | ORAL_TABLET | Freq: Two times a day (BID) | ORAL | Status: DC
  Administered 2021-01-29 – 2021-02-01 (×6): 400 mg
  Filled 2021-01-29 (×6): qty 2

## 2021-01-29 MED ORDER — LIDOCAINE HCL (PF) 1 % IJ SOLN
INTRAMUSCULAR | Status: DC | PRN
  Administered 2021-01-29: 5 mL via INTRADERMAL

## 2021-01-29 MED ORDER — CHLORHEXIDINE GLUCONATE CLOTH 2 % EX PADS
6.0000 | MEDICATED_PAD | Freq: Every day | CUTANEOUS | Status: DC
  Administered 2021-01-30 – 2021-02-01 (×3): 6 via TOPICAL

## 2021-01-29 SURGICAL SUPPLY — 1 items: KIT CATH DIALYSIS TRI 20X13 (CATHETERS) ×1 IMPLANT

## 2021-01-29 NOTE — Consult Note (Signed)
PHARMACY CONSULT NOTE  Pharmacy Consult for Electrolyte Monitoring and Replacement   Recent Labs: Potassium (mmol/L)  Date Value  01/16/2021 4.5   Magnesium (mg/dL)  Date Value  88/10/314 2.7 (H)   Calcium (mg/dL)  Date Value  94/58/5929 9.9   Albumin (g/dL)  Date Value  24/46/2863 2.4 (L)   Phosphorus (mg/dL)  Date Value  81/77/1165 8.2 (H)   Sodium (mmol/L)  Date Value  02/08/2021 138   Assessment: 55 yo F with COVID-19 presenting with acute hypoxic respiratory failure in the setting of COVID-19 pneumonia. PMH includes HTN, diabetes, and IBS. Pt intubated 2/4. Pharmacy consulted for electrolyte monitoring and replacement.  Nutrition: Tube feeds  Goal of Therapy:  - K >4 - Mag >2 - all other electrolytes WNL  Plan:  2/17 AM labs:  K 4.5  Mag 2.7  Scr 4.64 (worsening) - No electrolyte replacement needed today, nephrology considering dialysis - Will follow-up electrolytes with tomorrow's AM labs  Gloriann Loan, PharmD Student 01/31/2021 10:22 AM

## 2021-01-29 NOTE — Op Note (Signed)
  OPERATIVE NOTE   PROCEDURE: 1. Insertion of temporary dialysis catheter catheter right IJ approach.  PRE-OPERATIVE DIAGNOSIS: Acute on chronic renal insufficiency secondary to COVID-19 infection  POST-OPERATIVE DIAGNOSIS: Same  SURGEON: Renford Dills M.D.  ANESTHESIA: 1% lidocaine local infiltration  ESTIMATED BLOOD LOSS: Minimal cc  INDICATIONS:   [name] is a 55 y.o. female who presents with worsening renal function she is now intubated in the intensive care unit critically ill secondary to COVID-19.  Family wishes for a trial of dialysis for supportive care.  Risks and benefits were reviewed family is in agreement with proceeding.  DESCRIPTION: After obtaining full informed written consent, the patient was positioned supine. The right neck was prepped and draped in a sterile fashion. Ultrasound was placed in a sterile sleeve. Ultrasound was utilized to identify the right internal jugular vein which is noted to be echolucent and compressible indicating patency. Images recorded for the permanent record. Under real-time visualization a Seldinger needle is inserted into the vein and the guidewires advanced without difficulty. Small counterincision was made at the wire insertion site. Dilator is passed over the wire and the temporary dialysis catheter catheter is fed over the wire without difficulty.  All lumens aspirate and flush easily and are packed with heparin saline. Catheter secured to the skin of the right neck with 2-0 silk. A sterile dressing is applied with Biopatch.  COMPLICATIONS: None  CONDITION: Unchanged  Levora Dredge Office:  [phone] 01/24/2021, 7:17 PM

## 2021-01-29 NOTE — Consult Note (Addendum)
ANTICOAGULATION CONSULT NOTE   Pharmacy Consult for IV heparin Indication: atrial fibrillation  Patient Measurements: Height: 5\' 7"  (170.2 cm) Weight: (!) 186.9 kg (412 lb) IBW/kg (Calculated) : 61.6  Labs: Recent Labs    01/27/21 0530 01/27/21 1017 01/28/21 0305 01/28/21 1058 01/17/2021 0409 01/30/2021 1327  HGB 8.3*  --  8.6*  --  8.4*  --   HCT 28.5*  --  27.9*  --  27.8*  --   PLT 286  --  306  --  323  --   APTT  --   --   --   --   --  48*  LABPROT  --   --   --   --   --  16.9*  INR  --   --   --   --   --  1.4*  HEPARINUNFRC  --   --   --   --   --  0.92*  HEPRLOWMOCWT  --  0.84  --  1.85  --   --   CREATININE 2.88*  --  3.60*  --  4.64*  --     Estimated Creatinine Clearance: 24.2 mL/min (A) (by C-G formula based on SCr of 4.64 mg/dL (H)).   Medical History: Past Medical History:  Diagnosis Date  . Arthritis    hips  . Diabetes mellitus without complication (HCC)    type 2  . Family history of adverse reaction to anesthesia    mother has ponv  . Goiter    followed by dr gerkin q year  . Hypertension   . IBS (irritable bowel syndrome)   . PONV (postoperative nausea and vomiting)    ponv 1 day after last dental surgery    Assessment: Pt is a 55 y.o. F presenting to the hospital due to acute respiratory failure with hypoxia secondary to COVID-19 pneumonia. PMH includes HTN, T2DM, and IBS. During sedation adjustments, pt developed A.fib RVR, with no PMH of A.fib on file. She was initiated on amiodarone bolus and drip. Pharmacy has been consulted for Lovenox dosing.  Pt renal function worsening (1.93 >>2.88 >>3.60). Pt is also fluid overloaded. Given worsening renal function, low threshold to switch anticoagulation to IV heparin or apixaban. Concerns with IV heparin include worsening volume overload. Concerns with apixaban include lack of easy reversal.   Discussed on rounds. Will switch LMWH to IV heparin. Nephrology planning to proceed with RRT.  Date LMWH  level (4 hr) Comment  2/16 at 1017 0.84 Therapeutic  2/17 at 1058 1.85 Supratherapeutic  2/18 at 1327 0.92 Last dose 2/17 0600     Goal of Therapy:  Heparin level 0.3-0.7 units/ml Monitor platelets by anticoagulation protocol: Yes   Plan:  --Discontinue Lovenox --Anti-Xa level = 0.92 today at 1327 (~30 hours after last dose of Lovenox). Reflects poor clearance.  --Will re-check Anti-Xa level tomorrow AM. Will start IV heparin as appropriate based on level.   3/17 01/13/2021,7:10 PM

## 2021-01-29 NOTE — Progress Notes (Signed)
ID Patient remains intubated and sedated Underwent right IJ dialysis catheter placement today  BP 140/65   Pulse 86   Temp 99.32 F (37.4 C)   Resp 18   Ht 5\' 7"  (1.702 m)   Wt (!) 186.9 kg   SpO2 92%   BMI 64.53 kg/m    Intubated Frothy pink secretion Chest bilateral air entry with crepitations bilateral Tachycardia Abdomen pannus Maceration over the inguinal area  Labs CBC Latest Ref Rng & Units 2021-02-15 01/28/2021 01/27/2021  WBC 4.0 - 10.5 K/uL 16.7(H) 15.7(H) 13.4(H)  Hemoglobin 12.0 - 15.0 g/dL 01/20/2021) 3.7(S) 8.3(L)  Hematocrit 36.0 - 46.0 % 27.8(L) 27.9(L) 28.5(L)  Platelets 150 - 400 K/uL 323 306 286    CMP Latest Ref Rng & Units 02-15-2021 01/28/2021 01/27/2021  Glucose 70 - 99 mg/dL 02/05/2021) 151(V) 616(W)  BUN 6 - 20 mg/dL 737(T) 062(I) 948(N)  Creatinine 0.44 - 1.00 mg/dL 462(V) 0.35(K) 0.93(G)  Sodium 135 - 145 mmol/L 138 141 144  Potassium 3.5 - 5.1 mmol/L 4.5 4.3 4.1  Chloride 98 - 111 mmol/L 109 109 112(H)  CO2 22 - 32 mmol/L 20(L) 21(L) 21(L)  Calcium 8.9 - 10.3 mg/dL 9.9 1.82(X 9.7  Total Protein 6.5 - 8.1 g/dL - - -  Total Bilirubin 0.3 - 1.2 mg/dL - - -  Alkaline Phos 38 - 126 U/L - - -  AST 15 - 41 U/L - - -  ALT 0 - 44 U/L - - -   Micro 01/25/2021 tracheal aspirate culture 20 Candida. 01/22/2021 blood culture no growth 01/19/2021 1 out of 3 set has staph epi which is likely a contaminant blood culture no growth 01/19/2021 UA 0-5 WBC  Impression/recommendation  Acute hypoxic respiratory failure secondary to Covid pneumonia. Pink frothy secretions from the lungs ARDS/rule out PE   Severe obesity  Acute kidney injury worsening Patient is going to be started on dialysis.  Leukocytosis Patient completed nearly 10 days of IV antibiotics.  Is being observed off antibiotic.  Cultures have been negative so far.  Low threshold to restart antibiotics.  Diabetes mellitus  ID will sign off call if needed.

## 2021-01-29 NOTE — Progress Notes (Signed)
[facility]  ROUNDING NOTE   Subjective:   UOP 664m Creatinine 4.64 (3.6) (2.88) (1.93)    Remains critically ill.   Objective:  Vital signs in last 24 hours:  Temp:  [97.34 F (36.3 C)-100.3 F (37.9 C)] 99.32 F (37.4 C) (02/18 0600) Pulse Rate:  [78-132] 112 (02/18 0600) Resp:  [13-33] 18 (02/18 0400) BP: (110-157)/(51-91) 110/51 (02/18 0600) SpO2:  [86 %-98 %] 98 % (02/18 0600) FiO2 (%):  [40 %-70 %] 60 % (02/18 0400) Weight:  [186.9 kg] 186.9 kg (02/18 0406)  Weight change: 0 kg Filed Weights   01/25/21 0500 01/28/21 0334 01/19/2021 0406  Weight: (!) 189.6 kg (!) 186.9 kg (!) 186.9 kg    Intake/Output: I/O last 3 completed shifts: In: 3324.3 [I.V.:1939.3; NG/GT:1285; IV Piggyback:100] Out: 14818[[HUDJS:9702]  Intake/Output this shift:  No intake/output data recorded.  Physical Exam: General: Critically ill  Head: ETT  Eyes: Anicteric   Neck:  trachea midline  Lungs:   PRVC FiO2 60%   Heart: Tachycardia, irregular  Abdomen:  Soft, nontender, obese  Extremities:  trace peripheral edema.  Neurologic: Intubated and sedated  Skin: No lesions  Access: none    Basic Metabolic Panel: Recent Labs  Lab 01/25/21 0510 01/26/21 0436 01/27/21 0530 01/28/21 0305 01/17/2021 0409  NA 143 143 144 141 138  K 4.5 3.7 4.1 4.3 4.5  CL 107 111 112* 109 109  CO2 24 23 21* 21* 20*  GLUCOSE 249* 160* 160* 210* 196*  BUN 87* 96* 108* 130* 136*  CREATININE 1.68* 1.93* 2.88* 3.60* 4.64*  CALCIUM 9.5 9.7 9.7 10.0 9.9  MG 2.2 2.5* 2.5* 2.6* 2.7*  PHOS 6.3* 5.2* 6.4* 6.9* 8.2*    Liver Function Tests: Recent Labs  Lab 01/25/21 0510 01/26/21 0436 01/27/21 0530 01/28/21 0305 01/23/2021 0409  ALBUMIN 2.3* 2.2* 2.1* 2.2* 2.4*   No results for input(s): LIPASE, AMYLASE in the last 168 hours. No results for input(s): AMMONIA in the last 168 hours.  CBC: Recent Labs  Lab 01/25/21 0510 01/26/21 0436 01/27/21 0530 01/28/21 0305 02/08/2021 0409  WBC 12.9*  14.0* 13.4* 15.7* 16.7*  NEUTROABS 9.8* 9.4* 8.2* 11.4* 11.8*  HGB 9.8* 9.0* 8.3* 8.6* 8.4*  HCT 31.8* 30.4* 28.5* 27.9* 27.8*  MCV 88.3 88.6 89.6 86.9 87.1  PLT 282 308 286 306 323    Cardiac Enzymes: No results for input(s): CKTOTAL, CKMB, CKMBINDEX, TROPONINI in the last 168 hours.  BNP: Invalid input(s): POCBNP  CBG: Recent Labs  Lab 01/28/21 1115 01/28/21 1625 01/28/21 1935 01/28/21 2308 02/02/2021 0331  GLUCAP 217* 212* 171* 167* 166*    Microbiology: Results for orders placed or performed during the hospital encounter of 01/24/2021  Blood Culture (routine x 2)     Status: None   Collection Time: 02/03/2021  2:56 PM   Specimen: BLOOD  Result Value Ref Range Status   Specimen Description BLOOD BLOOD LEFT ARM  Final   Special Requests   Final    BOTTLES DRAWN AEROBIC AND ANAEROBIC Blood Culture results may not be optimal due to an inadequate volume of blood received in culture bottles   Culture   Final    NO GROWTH 5 DAYS [facility] [address], [place] [address]   Report Status 01/19/2021 FINAL  Final  Blood Culture (routine x 2)     Status: None   Collection Time: 01/30/2021  2:59 PM   Specimen: BLOOD  Result Value Ref Range  Status   Specimen Description BLOOD BLOOD LEFT FOREARM  Final   Special Requests   Final    BOTTLES DRAWN AEROBIC AND ANAEROBIC Blood Culture adequate volume   Culture   Final    NO GROWTH 5 DAYS Performed at [facility], [address], [place], [place] [address]    Report Status 01/19/2021 FINAL  Final  Culture, respiratory     Status: None   Collection Time: 01/15/21  5:50 PM   Specimen: Tracheal Aspirate; Respiratory  Result Value Ref Range Status   Specimen Description   Final    TRACHEAL ASPIRATE Performed at [facility], [address], [place], [place] [address]    Special Requests   Final    NONE Performed at [facility], [address] [place], [place]  [address]    Gram Stain   Final    FEW WBC PRESENT, PREDOMINANTLY PMN FEW GRAM NEGATIVE RODS FEW GRAM POSITIVE RODS RARE GRAM POSITIVE COCCI    Culture   Final    Normal respiratory flora-no Staph aureus or Pseudomonas seen Performed at [facility], [place] [address] [place], [place] [address]    Report Status 01/18/2021 FINAL  Final  Culture, respiratory (non-expectorated)     Status: None   Collection Time: 01/19/21  6:20 PM   Specimen: Tracheal Aspirate; Respiratory  Result Value Ref Range Status   Specimen Description   Final    TRACHEAL ASPIRATE Performed at [facility], [address], [place], [place] [address]    Special Requests   Final    NONE Performed at [facility], [address] [place], [place] [address]    Gram Stain   Final    ABUNDANT WBC PRESENT, PREDOMINANTLY PMN FEW GRAM POSITIVE COCCI RARE GRAM NEGATIVE RODS RARE GRAM POSITIVE RODS    Culture   Final    Normal respiratory flora-no Staph aureus or Pseudomonas seen Performed at [facility], [place] [address], [place], [place] [address]    Report Status 01/22/2021 FINAL  Final  CULTURE, BLOOD (ROUTINE X 2) w Reflex to ID Panel     Status: Abnormal   Collection Time: 01/19/21  6:45 PM   Specimen: BLOOD  Result Value Ref Range Status   Specimen Description   Final    BLOOD BLOOD LEFT HAND Performed at [facility], [address], [place], [place] [address]    Special Requests   Final    BOTTLES DRAWN AEROBIC AND ANAEROBIC Blood Culture adequate volume Performed at [facility], [address] [place], [place] [address]    Culture  Setup Time   Final    GRAM POSITIVE COCCI IN BOTH AEROBIC AND ANAEROBIC BOTTLES CRITICAL RESULT CALLED TO, READ BACK BY AND VERIFIED WITH: BRANDON BEERS AT 9030 ON 01/20/21 BY SS    Culture (A)  Final    STAPHYLOCOCCUS EPIDERMIDIS THE SIGNIFICANCE OF ISOLATING THIS ORGANISM FROM A SINGLE SET OF BLOOD CULTURES WHEN  MULTIPLE SETS ARE DRAWN IS UNCERTAIN. PLEASE NOTIFY THE MICROBIOLOGY DEPARTMENT WITHIN ONE WEEK IF SPECIATION AND SENSITIVITIES ARE REQUIRED. Performed at [facility], [place] [address] [place],  [address]    Report Status 01/23/2021 FINAL  Final  Blood Culture ID Panel (Reflexed)     Status: Abnormal   Collection Time: 01/19/21  6:45 PM  Result Value Ref Range Status   Enterococcus faecalis NOT DETECTED NOT DETECTED Final   Enterococcus Faecium NOT DETECTED NOT  DETECTED Final   Listeria monocytogenes NOT DETECTED NOT DETECTED Final   Staphylococcus species DETECTED (A) NOT DETECTED Final    Comment: CRITICAL RESULT CALLED TO, READ BACK BY AND VERIFIED WITH: BRANDON BEERS AT 1539 ON 01/20/21 BY SS    Staphylococcus aureus (BCID) NOT DETECTED NOT DETECTED Final   Staphylococcus epidermidis DETECTED (A) NOT DETECTED Final    Comment: Methicillin (oxacillin) resistant coagulase negative staphylococcus. Possible blood culture contaminant (unless isolated from more than one blood culture draw or clinical case suggests pathogenicity). No antibiotic treatment is indicated for blood  culture contaminants. CRITICAL RESULT CALLED TO, READ BACK BY AND VERIFIED WITH: BRANDON BEERS AT 4970 ON 01/20/21 BY SS    Staphylococcus lugdunensis NOT DETECTED NOT DETECTED Final   Streptococcus species NOT DETECTED NOT DETECTED Final   Streptococcus agalactiae NOT DETECTED NOT DETECTED Final   Streptococcus pneumoniae NOT DETECTED NOT DETECTED Final   Streptococcus pyogenes NOT DETECTED NOT DETECTED Final   A.calcoaceticus-baumannii NOT DETECTED NOT DETECTED Final   Bacteroides fragilis NOT DETECTED NOT DETECTED Final   Enterobacterales NOT DETECTED NOT DETECTED Final   Enterobacter cloacae complex NOT DETECTED NOT DETECTED Final   Escherichia coli NOT DETECTED NOT DETECTED Final   Klebsiella aerogenes NOT DETECTED NOT DETECTED Final   Klebsiella oxytoca NOT DETECTED NOT DETECTED Final   Klebsiella  pneumoniae NOT DETECTED NOT DETECTED Final   Proteus species NOT DETECTED NOT DETECTED Final   Salmonella species NOT DETECTED NOT DETECTED Final   Serratia marcescens NOT DETECTED NOT DETECTED Final   Haemophilus influenzae NOT DETECTED NOT DETECTED Final   Neisseria meningitidis NOT DETECTED NOT DETECTED Final   Pseudomonas aeruginosa NOT DETECTED NOT DETECTED Final   Stenotrophomonas maltophilia NOT DETECTED NOT DETECTED Final   Candida albicans NOT DETECTED NOT DETECTED Final   Candida auris NOT DETECTED NOT DETECTED Final   Candida glabrata NOT DETECTED NOT DETECTED Final   Candida krusei NOT DETECTED NOT DETECTED Final   Candida parapsilosis NOT DETECTED NOT DETECTED Final   Candida tropicalis NOT DETECTED NOT DETECTED Final   Cryptococcus neoformans/gattii NOT DETECTED NOT DETECTED Final   Methicillin resistance mecA/C DETECTED (A) NOT DETECTED Final    Comment: CRITICAL RESULT CALLED TO, READ BACK BY AND VERIFIED WITH: BRANDON BEERS AT 1539 ON 01/20/21 BY SS Performed at [facility], [address] [place], [place] [address]   CULTURE, BLOOD (ROUTINE X 2) w Reflex to ID Panel     Status: None   Collection Time: 01/19/21  6:47 PM   Specimen: BLOOD  Result Value Ref Range Status   Specimen Description BLOOD BLOOD RIGHT HAND  Final   Special Requests   Final    BOTTLES DRAWN AEROBIC AND ANAEROBIC Blood Culture adequate volume   Culture   Final    NO GROWTH 5 DAYS Performed at [facility], [address] [place], [place] [address]    Report Status 01/24/2021 FINAL  Final  MRSA PCR Screening     Status: None   Collection Time: 01/20/21  7:58 AM   Specimen: Nasal Mucosa; Nasopharyngeal  Result Value Ref Range Status   MRSA by PCR NEGATIVE NEGATIVE Final    Comment:        The GeneXpert MRSA Assay (FDA approved for NASAL specimens only), is one component of a comprehensive MRSA colonization surveillance program. It is not intended to diagnose  MRSA infection nor to guide or monitor treatment for MRSA infections. Performed at [facility], [address] [place], [place] [address]   CULTURE, BLOOD (ROUTINE X 2) w Reflex to ID Panel     Status: None   Collection Time: 01/22/21  1:02 PM   Specimen: BLOOD  Result Value Ref Range Status   Specimen Description BLOOD BLOOD LEFT HAND  Final   Special Requests   Final    BOTTLES DRAWN AEROBIC AND ANAEROBIC Blood Culture adequate volume   Culture   Final    NO GROWTH 5 DAYS Performed at [facility], [address] [place], [place] [address]    Report Status 01/27/2021 FINAL  Final  CULTURE, BLOOD (ROUTINE X 2) w Reflex to ID Panel     Status: None   Collection Time: 01/22/21  1:02 PM   Specimen: BLOOD  Result Value Ref Range Status   Specimen Description BLOOD BLOOD RIGHT HAND  Final   Special Requests   Final    BOTTLES DRAWN AEROBIC AND ANAEROBIC Blood Culture adequate volume   Culture   Final    NO GROWTH 5 DAYS Performed at [facility], [address] [place], [place] [address]    Report Status 01/27/2021 FINAL  Final  Culture, respiratory (non-expectorated)     Status: None   Collection Time: 01/25/21 10:30 AM   Specimen: Tracheal Aspirate; Respiratory  Result Value Ref Range Status   Specimen Description   Final    TRACHEAL ASPIRATE Performed at [facility], [address], [place], [place] [address]    Special Requests   Final    NONE Performed at [facility], [address] [place], [place] [address]    Gram Stain   Final    MODERATE WBC PRESENT,BOTH PMN AND MONONUCLEAR NO ORGANISMS SEEN Performed at [facility], [place] [address] [place], [place] [address]    Culture FEW CANDIDA ALBICANS  Final   Report Status 01/28/2021 FINAL  Final    Coagulation Studies: No results for input(s): LABPROT, INR in the last 72 hours.  Urinalysis: No results for input(s): COLORURINE, LABSPEC, PHURINE,  GLUCOSEU, HGBUR, BILIRUBINUR, KETONESUR, PROTEINUR, UROBILINOGEN, NITRITE, LEUKOCYTESUR in the last 72 hours.  Invalid input(s): APPERANCEUR    Imaging: DG Chest Port 1 View  Result Date: 01/28/2021 CLINICAL DATA:  Respiratory failure with hypoxia. EXAM: PORTABLE CHEST 1 VIEW COMPARISON:  01/25/2021. FINDINGS: Endotracheal tube and NG tube in stable position. Cardiomegaly. Low lung volumes. Diffuse bilateral pulmonary infiltrates/edema again noted without interim change. Small right pleural effusion cannot be excluded. No pneumothorax. IMPRESSION: 1. Lines and tubes in stable position. 2. Cardiomegaly. 3. Low lung volumes. Diffuse bilateral pulmonary infiltrates/edema again noted without interim change. Small right pleural effusion cannot be excluded. Electronically Signed   By: Marcello Moores  Register   On: 01/28/2021 05:29     Medications:   . sodium chloride Stopped (01/16/2021 0528)  . amiodarone 30 mg/hr (02/05/2021 0651)  . dexmedetomidine (PRECEDEX) IV infusion 0.6 mcg/kg/hr (01/17/2021 0651)  . famotidine (PEPCID) IV Stopped (01/28/21 2059)  . feeding supplement (NEPRO CARB STEADY) 1,000 mL (01/24/2021 0523)  . fentaNYL infusion INTRAVENOUS 150 mcg/hr (01/22/2021 0651)  . midazolam Stopped (01/26/21 1625)   . chlorhexidine gluconate (MEDLINE KIT)  15 mL Mouth Rinse BID  . Chlorhexidine Gluconate Cloth  6 each Topical Daily  . diltiazem  30 mg Per Tube Q6H  . enoxaparin (LOVENOX) injection  1 mg/kg Subcutaneous Q24H  . feeding supplement (PROSource TF)  45 mL Per Tube BID  . Gerhardt's butt cream   Topical QID  .  insulin aspart  0-20 Units Subcutaneous Q4H  . insulin aspart  5 Units Subcutaneous Q4H  . insulin detemir  22 Units Subcutaneous Daily  . mouth rinse  15 mL Mouth Rinse 10 times per day  . metoprolol tartrate  100 mg Per Tube BID  . sodium chloride flush  10-40 mL Intracatheter Q12H  . sodium chloride flush  10-40 mL Intracatheter Q12H  . sodium chloride flush  3 mL Intravenous  Q12H   sodium chloride, acetaminophen (TYLENOL) oral liquid 160 mg/5 mL, acetaminophen, albuterol, guaiFENesin-dextromethorphan, midazolam, ondansetron (ZOFRAN) IV, sodium chloride flush, sodium chloride flush, sodium chloride flush  Assessment/ Plan:  Ms. Dana Bruce is a 55 y.o. white female with hypertension, IBS, diabetes mellitus type II, who was admitted to [facility] on 02/02/2021 for Acute respiratory failure with hypoxia ([place]) [J96.01] Pneumonia due to COVID-19 virus [U07.1, J12.82] COVID-19 [U07.1]  1. Acute kidney injury:  Nonoliguric urine output. Responded to diuretics but she is now with azotemia and worsening creatinine Declining urine output.  Hyperkalemia has improved.  - Hold home medication of telmisartan. - Patient may benefit from a short course of intermittent dialysis. Have discussed case with patient's Husband, Dana Bruce, who wants a trial of hemodialysis.   3. Hypertension: fluid driven. With atrial fibrillation. Blood pressure now at goal. Holding amlodipine and metoprolol Started on amiodarone and diltiazem.   4. 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 On metformin. Hemoglobin A1c at goal at 6.3%.  - holding metformin.   5. Acute respiratory failure: requiring intubation and mechanical ventilation. COVID-19 infection, ARDS.    LOS: 35   2/18/20227:18 AM

## 2021-01-29 NOTE — Consult Note (Signed)
Consultation Note Date: 01/24/2021   Patient Name: Dana Bruce  DOB: 15-Dec-1965  MRN: [id number]  Age / Sex: 55 y.o., female  PCP: Maury Dus, MD Referring Physician: Tyler Pita, MD  Reason for Consultation: Establishing goals of care  HPI/Patient Profile: Echo Propp is a 55 year old female with a past medical history as listed below who presents to [facility] ED on 01/24/2021 with complaints of shortness of breath.  She reports she began having Covid-like symptoms (malaise, body aches shortness of breath,cough) last Friday 01/08/21.  She tested positive on 01/10/2021.  She reports that her shortness of breath has worsened over the past several days.  She denied chest pain, abdominal pain, vomiting, dysuria, palpitations, dizziness, edema. She is unvaccinated for COVID-19.  Clinical Assessment and Goals of Care: Spoke with husband. He tells me he is getting mixed messages and he is confused. He discusses her deconditioning and mostly bed bound status at baseline. She did crafts while in bed. He states food is a major part of her QOL.   We discussed her diagnosis, prognosis, GOC, EOL wishes disposition and options.  Created space and opportunity for patient  to explore thoughts and feelings regarding current medical information.   A detailed discussion was had today regarding advanced directives.  Concepts specific to code status, artifical feeding and hydration, IV antibiotics and rehospitalization were discussed.  The difference between an aggressive medical intervention path and a comfort care path was discussed.  Values and goals of care important to patient and family were attempted to be elicited.  Discussed limitations of medical interventions to prolong quality of life in some situations and discussed the concept of human mortality.  He states she would not want to go to a nursing facility. He  states she would not want a tracheostomy. He is concerned for her QOL as she was depressed with her QOL prior to this hospitalization. He discusses there are things worse than death, and she is a person of faith. We will speak again.    SUMMARY OF RECOMMENDATIONS   No tracheostomy.  Will remeet Monday. Leaning toward comfort measures.   Prognosis:   Very poor       Primary Diagnoses: Present on Admission: . COVID-19   I have reviewed the medical record, interviewed the patient and family, and examined the patient. The following aspects are pertinent.  Past Medical History:  Diagnosis Date  . Arthritis    hips  . Diabetes mellitus without complication ([place])    type 2  . Family history of adverse reaction to anesthesia    mother has ponv  . Goiter    followed by dr gerkin q year  . Hypertension   . IBS (irritable bowel syndrome)   . PONV (postoperative nausea and vomiting)    ponv 1 day after last dental surgery   Social History   Socioeconomic History  . Marital status: Married    Spouse name: Not on file  . Number of children: Not on file  . Years of  education: Not on file  . Highest education level: Not on file  Occupational History  . Not on file  Tobacco Use  . Smoking status: Never Smoker  . Smokeless tobacco: Never Used  Vaping Use  . Vaping Use: Never used  Substance and Sexual Activity  . Alcohol use: No  . Drug use: No  . Sexual activity: Not on file  Other Topics Concern  . Not on file  Social History Narrative  . Not on file   Social Determinants of Health   Financial Resource Strain: Not on file  Food Insecurity: Not on file  Transportation Needs: Not on file  Physical Activity: Not on file  Stress: Not on file  Social Connections: Not on file   No family history on file. Scheduled Meds: . [MAR Hold] amiodarone  400 mg Per Tube BID   Followed by  . [MAR Hold] amiodarone  200 mg Per Tube Daily  . [MAR Hold] chlorhexidine gluconate  (MEDLINE KIT)  15 mL Mouth Rinse BID  . [MAR Hold] Chlorhexidine Gluconate Cloth  6 each Topical Daily  . [MAR Hold] Chlorhexidine Gluconate Cloth  6 each Topical Q0600  . [MAR Hold] diltiazem  30 mg Per Tube Q6H  . [MAR Hold] feeding supplement (PROSource TF)  45 mL Per Tube BID  . [MAR Hold] Gerhardt's butt cream   Topical QID  . [MAR Hold] insulin aspart  0-20 Units Subcutaneous Q4H  . [MAR Hold] insulin aspart  5 Units Subcutaneous Q4H  . [MAR Hold] insulin detemir  22 Units Subcutaneous Daily  . [MAR Hold] mouth rinse  15 mL Mouth Rinse 10 times per day  . [MAR Hold] metoprolol tartrate  100 mg Per Tube BID  . [MAR Hold] sodium chloride flush  10-40 mL Intracatheter Q12H  . [MAR Hold] sodium chloride flush  10-40 mL Intracatheter Q12H  . [MAR Hold] sodium chloride flush  3 mL Intravenous Q12H   Continuous Infusions: . [MAR Hold] sodium chloride Stopped (01/17/2021 0528)  . [MAR Hold] dexmedetomidine (PRECEDEX) IV infusion 0.6 mcg/kg/hr (02/07/2021 1257)  . [MAR Hold] famotidine (PEPCID) IV Stopped (01/28/21 2059)  . feeding supplement (NEPRO CARB STEADY) 1,000 mL (01/16/2021 0523)  . fentaNYL infusion INTRAVENOUS 150 mcg/hr (01/31/2021 1018)   PRN Meds:.[MAR Hold] sodium chloride, [MAR Hold] acetaminophen (TYLENOL) oral liquid 160 mg/5 mL, [MAR Hold] acetaminophen, [MAR Hold] albuterol, [MAR Hold] guaiFENesin-dextromethorphan, [MAR Hold] midazolam, [MAR Hold] ondansetron (ZOFRAN) IV, [MAR Hold] sodium chloride flush, [MAR Hold] sodium chloride flush, [MAR Hold] sodium chloride flush Medications Prior to Admission:  Prior to Admission medications   Medication Sig Start Date End Date Taking? Authorizing Provider  acetaminophen (TYLENOL) 650 MG CR tablet Take 650 mg by mouth every 8 (eight) hours as needed for pain.   Yes [provider]  amLODipine (NORVASC) 10 MG tablet Take 10 mg by mouth daily.  01/29/14  Yes [provider]  ascorbic acid (VITAMIN C) 500 MG tablet Take 500  mg by mouth daily.   Yes [provider]  aspirin 81 MG tablet Take 81 mg by mouth daily.   Yes [provider]  azelastine (OPTIVAR) 0.05 % ophthalmic solution Apply 1 drop to eye 2 (two) times daily. 12/23/20  Yes [provider]  cloNIDine (CATAPRES) 0.1 MG tablet Take 0.1 mg by mouth 2 (two) times daily. 11/05/20  Yes [provider]  fenofibrate (TRICOR) 145 MG tablet Take 145 mg by mouth daily.   Yes [provider]  fluticasone (  FLONASE) 50 MCG/ACT nasal spray Place 2 sprays into both nostrils daily.   Yes [provider]  Glucosamine 500 MG CAPS Take 1,000 mg by mouth daily.   Yes [provider]  hyoscyamine (LEVSIN SL) 0.125 MG SL tablet Take 0.125-0.25 mg by mouth every 4 (four) hours as needed for cramping.   Yes [provider]  metFORMIN (GLUCOPHAGE) 1000 MG tablet Take 1,000 mg by mouth 2 (two) times daily.  02/23/14  Yes [provider]  metoprolol (LOPRESSOR) 100 MG tablet Take 100 mg by mouth 2 (two) times daily. 02/11/17  Yes [provider]  oxybutynin (DITROPAN) 5 MG tablet Take 5 mg by mouth every 8 (eight) hours as needed for incontinence. 12/01/20  Yes [provider]  pioglitazone (ACTOS) 30 MG tablet Take 30 mg by mouth daily.  02/23/14  Yes [provider]  promethazine (PHENERGAN) 25 MG tablet Take 25 mg by mouth every 6 (six) hours as needed for nausea or vomiting.   Yes [provider]  simvastatin (ZOCOR) 20 MG tablet Take 20 mg by mouth at bedtime.  02/23/14  Yes [provider]  telmisartan-hydrochlorothiazide (MICARDIS HCT) 80-25 MG tablet Take 1 tablet by mouth daily. 11/16/20  Yes [provider]  traMADol (ULTRAM) 50 MG tablet Take 100 mg by mouth every 12 (twelve) hours as needed for pain. 01/06/21  Yes [provider]  TRULICITY 1.5 HM/0.9OB SOPN Inject 1.5 mg into the skin once a week.   Yes [provider]  Vitamin  D, Cholecalciferol, 25 MCG (1000 UT) TABS Take 3,000 Units by mouth daily.   Yes [provider]   Allergies  Allergen Reactions  . Lasix [Furosemide] Itching  . Penicillins Hives and Itching    Has patient had a PCN reaction causing immediate rash, facial/tongue/throat swelling, SOB or lightheadedness with hypotension: yes Has patient had a PCN reaction causing severe rash involving mucus membranes or skin necrosis: no Has patient had a PCN reaction that required hospitalization No Has patient had a PCN reaction occurring within the last 10 years: No If all of the above answers are "NO", then may proceed with Cephalosporin use.   [place] Flavor [Flavoring Agent] Itching, Nausea And Vomiting and Rash    Breaks out in places where she touches strawberries    Review of Systems  Unable to perform ROS   Physical Exam Constitutional:      Comments: On ventilator     Vital Signs: BP 140/65   Pulse 86   Temp 99.32 F (37.4 C)   Resp 18   Ht $R'5\' 7"'BV$  (1.702 m)   Wt (!) 186.9 kg   SpO2 92%   BMI 64.53 kg/m  Pain Scale: CPOT   Pain Score: 0-No pain   SpO2: SpO2: 92 % O2 Device:SpO2: 92 % O2 Flow Rate: .O2 Flow Rate (L/min): 45 L/min  IO: Intake/output summary:   Intake/Output Summary (Last 24 hours) at 01/20/2021 1549 Last data filed at 01/15/2021 1018 Gross per 24 hour  Intake 2350.27 ml  Output 350 ml  Net 2000.27 ml    LBM: Last BM Date: 01/28/21 Baseline Weight: Weight: (!) 204.1 kg Most recent weight: Weight: (!) 186.9 kg      Time In: 3:45 Time Out: 4:30 Time Total: 45 min Greater than 50%  of this time was spent counseling and coordinating care related to the above assessment and plan.  Signed by: Asencion Gowda, NP   Please contact Palliative Medicine Team phone  at [phone] for questions and concerns.  For individual provider: See Shea Evans

## 2021-01-29 NOTE — Consult Note (Signed)
[facility] Consult Note  MRN : [id number]  Dana Bruce is a 55 y.o. (05-24-1966) female who presents with chief complaint of  Chief Complaint  Patient presents with  . Shortness of Breath   History of Present Illness:  Patient is admitted to the hospital with COVID-19 pneumonia and has developed acute renal failure.  Nephrology would like to initiate dialysis at this time however the patient does not have an adequate dialysis access.  Vascular surgery was consulted by Dr. Juleen China for the placement of a temporary dialysis catheter.  Of note: The patient is critically ill sedated and vented.    Current Facility-Administered Medications  Medication Dose Route Frequency Provider Last Rate Last Admin  . 0.9 %  sodium chloride infusion  250 mL Intravenous PRN Flora Lipps, MD   Stopped at 01/31/2021 8832  . acetaminophen (TYLENOL) 160 MG/5ML solution 650 mg  650 mg Per Tube Q4H PRN Flora Lipps, MD   650 mg at 01/26/21 1539  . acetaminophen (TYLENOL) tablet 325 mg  325 mg Oral Q6H PRN Rust-Chester, Britton L, NP   325 mg at 01/26/21 1739  . albuterol (VENTOLIN HFA) 108 (90 Base) MCG/ACT inhaler 2 puff  2 puff Inhalation Q2H PRN Vanessa Archbold, MD   2 puff at 01/13/2021 1554  . amiodarone (PACERONE) tablet 400 mg  400 mg Per Tube BID Tyler Pita, MD       Followed by  . [START ON 02/08/2021] amiodarone (PACERONE) tablet 200 mg  200 mg Per Tube Daily Tyler Pita, MD      . chlorhexidine gluconate (MEDLINE KIT) (PERIDEX) 0.12 % solution 15 mL  15 mL Mouth Rinse BID Ottie Glazier, MD   15 mL at 01/26/2021 0831  . Chlorhexidine Gluconate Cloth 2 % PADS 6 each  6 each Topical Daily Flora Lipps, MD   6 each at 01/27/21 0920  . [START ON 01/30/2021] Chlorhexidine Gluconate Cloth 2 % PADS 6 each  6 each Topical Q0600 Kolluru, Sarath, MD      . dexmedetomidine (PRECEDEX) 400 MCG/100ML (4 mcg/mL) infusion  0.4-1.2 mcg/kg/hr Intravenous Titrated Flora Lipps, MD  28.4 mL/hr at 01/22/2021 1257 0.6 mcg/kg/hr at 01/28/2021 1257  . diltiazem (CARDIZEM) tablet 30 mg  30 mg Per Tube Q6H Aleskerov, Fuad, MD   30 mg at 01/31/2021 1144  . famotidine (PEPCID) IVPB 20 mg premix  20 mg Intravenous Q24H Benita Gutter, [facility]   Stopped at 01/28/21 2059  . feeding supplement (NEPRO CARB STEADY) liquid 1,000 mL  1,000 mL Per Tube Continuous Ottie Glazier, MD 65 mL/hr at 02/03/2021 0523 1,000 mL at 01/28/2021 0523  . feeding supplement (PROSource TF) liquid 45 mL  45 mL Per Tube BID Ottie Glazier, MD   45 mL at 01/15/2021 0840  . fentaNYL 2565mg in NS 252m(1028mml) infusion-PREMIX  0-400 mcg/hr Intravenous Continuous GruDallie PilesPH 15 mL/hr at 01/22/2021 1018 150 mcg/hr at 02/02/2021 1018  . Gerhardt's butt cream   Topical QID AleOttie GlazierD   Given at 02/07/2021 [phone] guaiFENesin-dextromethorphan (ROBITUSSIN DM) 100-10 MG/5ML syrup 5 mL  5 mL Oral Q4H PRN FunVanessa DurhamD   5 mL at 01/25/2021 1607  . insulin aspart (novoLOG) injection 0-20 Units  0-20 Units Subcutaneous Q4H KeeDarel Hong NP   4 Units at 02/02/2021 1142  . insulin aspart (novoLOG) injection 5 Units  5 Units Subcutaneous Q4H KasFlora LippsD   5 Units at 01/16/2021  1143  . insulin detemir (LEVEMIR) injection 22 Units  22 Units Subcutaneous Daily Rust-Chester, Britton L, NP   22 Units at 01/16/2021 0840  . MEDLINE mouth rinse  15 mL Mouth Rinse 10 times per day Ottie Glazier, MD   15 mL at 01/24/2021 1148  . metoprolol tartrate (LOPRESSOR) tablet 100 mg  100 mg Per Tube BID Rust-Chester, Britton L, NP   100 mg at 01/16/2021 0841  . midazolam (VERSED) injection 2 mg  2 mg Intravenous Q2H PRN Rust-Chester, Britton L, NP   2 mg at 01/27/21 1738  . ondansetron (ZOFRAN) injection 4 mg  4 mg Intravenous Q6H PRN Flora Lipps, MD      . sodium chloride flush (NS) 0.9 % injection 10-40 mL  10-40 mL Intracatheter Q12H Ottie Glazier, MD   10 mL at 01/28/21 2029  . sodium chloride flush (NS) 0.9 % injection 10-40 mL   10-40 mL Intracatheter PRN Ottie Glazier, MD      . sodium chloride flush (NS) 0.9 % injection 10-40 mL  10-40 mL Intracatheter Q12H Flora Lipps, MD   10 mL at 01/28/21 2028  . sodium chloride flush (NS) 0.9 % injection 10-40 mL  10-40 mL Intracatheter PRN Flora Lipps, MD      . sodium chloride flush (NS) 0.9 % injection 3 mL  3 mL Intravenous Q12H Flora Lipps, MD   3 mL at 01/28/21 2029  . sodium chloride flush (NS) 0.9 % injection 3 mL  3 mL Intravenous PRN Flora Lipps, MD   3 mL at 01/16/21 2159   Past Medical History:  Diagnosis Date  . Arthritis    hips  . Diabetes mellitus without complication ([place])    type 2  . Family history of adverse reaction to anesthesia    mother has ponv  . Goiter    followed by dr gerkin q year  . Hypertension   . IBS (irritable bowel syndrome)   . PONV (postoperative nausea and vomiting)    ponv 1 day after last dental surgery   Past Surgical History:  Procedure Laterality Date  . ABDOMINAL HYSTERECTOMY     partial  . CHOLECYSTECTOMY    . COLONOSCOPY WITH PROPOFOL N/A 05/17/2017   Procedure: COLONOSCOPY WITH PROPOFOL;  Surgeon: Arta Silence, MD;  Location: [facility];  Service: Endoscopy;  Laterality: N/A;  . colonscopy  1995 or 1996   with barium enema  . DENTAL SURGERY    . DILATION AND CURETTAGE OF UTERUS     x 2  . surgery for ecotic pregnancy     Social History Social History   Tobacco Use  . Smoking status: Never Smoker  . Smokeless tobacco: Never Used  Vaping Use  . Vaping Use: Never used  Substance Use Topics  . Alcohol use: No  . Drug use: No   Family History No family history on file.  Patient is critically ill unable to obtain.  Allergies  Allergen Reactions  . Lasix [Furosemide] Itching  . Penicillins Hives and Itching    Has patient had a PCN reaction causing immediate rash, facial/tongue/throat swelling, SOB or lightheadedness with hypotension: yes Has patient had a PCN reaction causing severe rash  involving mucus membranes or skin necrosis: no Has patient had a PCN reaction that required hospitalization No Has patient had a PCN reaction occurring within the last 10 years: No If all of the above answers are "NO", then may proceed with Cephalosporin use.   [place] Flavor [Flavoring Agent]  Itching, Nausea And Vomiting and Rash    Breaks out in places where she touches strawberries    REVIEW OF SYSTEMS (Negative unless checked)  Constitutional: []Weight loss  []Fever  []Chills Cardiac: []Chest pain   []Chest pressure   []Palpitations   []Shortness of breath when laying flat   []Shortness of breath at rest   []Shortness of breath with exertion. Vascular:  []Pain in legs with walking   []Pain in legs at rest   []Pain in legs when laying flat   []Claudication   []Pain in feet when walking  []Pain in feet at rest  []Pain in feet when laying flat   []History of DVT   []Phlebitis   []Swelling in legs   []Varicose veins   []Non-healing ulcers Pulmonary:   []Uses home oxygen   []Productive cough   []Hemoptysis   []Wheeze  []COPD   []Asthma Neurologic:  []Dizziness  []Blackouts   []Seizures   []History of stroke   []History of TIA  []Aphasia   []Temporary blindness   []Dysphagia   []Weakness or numbness in arms   []Weakness or numbness in legs Musculoskeletal:  []Arthritis   []Joint swelling   []Joint pain   []Low back pain Hematologic:  []Easy bruising  []Easy bleeding   []Hypercoagulable state   []Anemic  []Hepatitis Gastrointestinal:  []Blood in stool   []Vomiting blood  []Gastroesophageal reflux/heartburn   []Difficulty swallowing. Genitourinary:  []Chronic kidney disease   []Difficult urination  []Frequent urination  []Burning with urination   []Blood in urine Skin:  []Rashes   []Ulcers   []Wounds Psychological:  []History of anxiety   [] History of major depression.  Unable to obtain the review of systems due to the patient's severe systemic illness and altered mental status.    Physical  Examination  Vitals:   01/13/2021 0841 01/24/2021 0900 01/27/2021 1000 01/21/2021 1144  BP: 108/68 (!) 111/56 (!) 116/55 119/61  Pulse: (!) 104 (!) 120 (!) 117   Resp:      Temp:  99.86 F (37.7 C) 100.04 F (37.8 C)   TempSrc:      SpO2:  95% 95%   Weight:      Height:       Body mass index is 64.53 kg/m. Gen: Critically ill Head: [place]/AT, No temporalis wasting Ear/Nose/Throat: Hearing grossly intact, nares w/o erythema or drainage Eyes: Sclera non-icteric, conjunctiva clear Neck: Supple, no nuchal rigidity.  No JVD.  Pulmonary: On vent  Cardiac: RRR, normal S1, S2, no Murmurs, rubs or gallops. Vascular:  Gastrointestinal: soft, non-tender/non-distended. No guarding/reflex.  Musculoskeletal: M/S 5/5 throughout.  Extremities without ischemic changes.  No deformity or atrophy.  Moderate edema in the lower extremities bilaterally Neurologic:  Psychiatric: Difficult to assess due to the severity of patient's illness. Dermatologic: No rashes or ulcers noted.   Lymph : No Cervical, Axillary, or Inguinal lymphadenopathy.  CBC Lab Results  Component Value Date   WBC 16.7 (H) 02/07/2021   HGB 8.4 (L) 01/28/2021   HCT 27.8 (L) 02/08/2021   MCV 87.1 02/05/2021   PLT 323 01/16/2021   BMET    Component Value Date/Time   NA 138 01/23/2021 0409   K 4.5 01/23/2021 0409   CL 109 01/16/2021 0409   CO2 20 (L) 01/25/2021 0409   GLUCOSE 196 (H) 01/16/2021 0409   BUN 136 (H) 01/17/2021 0409   CREATININE 4.64 (H) 01/16/2021 0409   CALCIUM 9.9 01/13/2021 0409   GFRNONAA 11 (L) 01/23/2021 0409   GFRAA >60  11/18/2016 2006   Estimated Creatinine Clearance: 24.2 mL/min (A) (by C-G formula based on SCr of 4.64 mg/dL (H)).  COAG Lab Results  Component Value Date   INR 1.4 (H) 01/30/2021   Radiology DG Chest 1 View  Result Date: 01/17/2021 CLINICAL DATA:  Shortness of breath. EXAM: CHEST  1 VIEW COMPARISON:  November 18, 2016 FINDINGS: There are diffuse bilateral ground-glass airspace  opacities. No pneumothorax. No definite large pleural effusion. The heart size is stable. Aortic calcifications are suspected. There is no acute osseous abnormality. IMPRESSION: Diffuse bilateral ground-glass airspace opacities consistent with the patient's history of viral pneumonia. Electronically Signed   By: Constance Holster M.D.   On: 02/04/2021 15:15   DG Abd 1 View  Result Date: 01/15/2021 CLINICAL DATA:  NG placement EXAM: ABDOMEN - 1 VIEW COMPARISON:  None. FINDINGS: NG tube enters the stomach with the tip in the body the stomach. Stomach decompressed. No dilated bowel loops. IMPRESSION: NG tube in the body the stomach.  No dilated bowel loops identified. Electronically Signed   By: Franchot Gallo M.D.   On: 01/15/2021 09:56   DG Chest Port 1 View  Result Date: 01/28/2021 CLINICAL DATA:  Respiratory failure with hypoxia. EXAM: PORTABLE CHEST 1 VIEW COMPARISON:  01/25/2021. FINDINGS: Endotracheal tube and NG tube in stable position. Cardiomegaly. Low lung volumes. Diffuse bilateral pulmonary infiltrates/edema again noted without interim change. Small right pleural effusion cannot be excluded. No pneumothorax. IMPRESSION: 1. Lines and tubes in stable position. 2. Cardiomegaly. 3. Low lung volumes. Diffuse bilateral pulmonary infiltrates/edema again noted without interim change. Small right pleural effusion cannot be excluded. Electronically Signed   By: Marcello Moores  Register   On: 01/28/2021 05:29   DG Chest Port 1 View  Result Date: 01/25/2021 CLINICAL DATA:  01/19/2021 EXAM: PORTABLE CHEST 1 VIEW COMPARISON:  Six days ago FINDINGS: Endotracheal tube with tip at the clavicular heads. The enteric tube at least reaches the stomach. Right PICC with tip at the upper right atrium. Stable cardiomegaly, low lung volumes, and pulmonary opacification. IMPRESSION: Stable hardware positioning and pulmonary opacification. Electronically Signed   By: Monte Fantasia M.D.   On: 01/25/2021 06:31   DG Chest Port 1  View  Result Date: 01/19/2021 CLINICAL DATA:  Acute respiratory failure with hypoxia. EXAM: PORTABLE CHEST 1 VIEW COMPARISON:  01/17/2021 FINDINGS: The endotracheal tube terminates above the carina. The enteric tube extends below the left hemidiaphragm. There are bilateral airspace opacities which are slightly improved from prior study. There is no pneumothorax or large pleural effusion. The heart size is stable. Aortic calcifications are noted. IMPRESSION: 1. Lines and tubes as above. 2. Improving bilateral airspace opacities. Electronically Signed   By: Constance Holster M.D.   On: 01/19/2021 01:37   DG Chest Port 1 View  Result Date: 01/17/2021 CLINICAL DATA:  55 year old female COVID-19.  Intubated. EXAM: PORTABLE CHEST 1 VIEW COMPARISON:  Portable chest 01/15/2021 and earlier. FINDINGS: Portable AP upright view at 0412 hours. Endotracheal tube tip stable at the level the clavicles. Enteric tube courses to the abdomen, tip not included. Larger lung volumes. Mediastinal contours remain normal. No pneumothorax. No pleural effusion is evident. Multifocal Patchy and confluent perihilar and lower lung opacity is greater on the right. Left lung base ventilation has improved. Lung apices remain relatively spared. No areas of worsening ventilation. IMPRESSION: 1. Stable lines and tubes. 2. Larger lung volumes. Bilateral pneumonia with improved left lung base ventilation. Electronically Signed   By: Herminio Heads.D.  On: 01/17/2021 05:36   DG Chest Port 1 View  Result Date: 01/15/2021 CLINICAL DATA:  Feeding tube placement. Endotracheal tube placement. COVID positive EXAM: PORTABLE CHEST 1 VIEW COMPARISON:  01/15/2021 FINDINGS: Endotracheal tube has been placed in good position. NG tube enters the stomach with the tip not visualized. Diffuse bilateral airspace disease unchanged from earlier today. No pleural effusion. No pneumothorax. IMPRESSION: Endotracheal tube in good position.  NG tube enters the stomach.  Diffuse bilateral airspace disease unchanged. Electronically Signed   By: Franchot Gallo M.D.   On: 01/15/2021 09:42   DG Chest Port 1 View  Result Date: 01/15/2021 CLINICAL DATA:  History of COVID-19 pneumonia, evaluate for interval change EXAM: PORTABLE CHEST 1 VIEW COMPARISON:  02/04/2021 FINDINGS: Cardiac shadow is stable. Patchy airspace disease is noted within both lungs consistent with the given clinical history. The overall appearance is slightly improved when compared with the prior exam however. No bony abnormality is noted. IMPRESSION: Slight improved aeration when compared with the prior exam. Persistent changes of COVID-19 pneumonia are seen. Electronically Signed   By: Inez Catalina M.D.   On: 01/15/2021 02:55   [place] EKG SITE RITE  Result Date: 01/23/2021 If [facility] image not attached, placement could not be confirmed due to current cardiac rhythm.  [place] EKG SITE RITE  Result Date: 01/15/2021 If Site Rite image not attached, placement could not be confirmed due to current cardiac rhythm.  Assessment/Plan Patient is admitted to the hospital with COVID-19 pneumonia and has developed acute renal failure  1.  Acute Renal Failure: We will proceed with temporary dialysis catheter placement at this time.  Risks and benefits discussed with patient and/or family, and the catheter will be placed to allow immediate initiation of dialysis.  If the patient's renal function does not improve throughout the hospital course, we will be happy to place a tunneled dialysis catheter for long term use prior to discharge.    2.  COVID-19 pneumonia: Intubated Being managed by the critical care team  3.  Diabetes: On appropriate medications Encouraged good control as its slows the progression of atherosclerotic disease  Discussed with Dr. Francene Castle, PA-C  01/24/2021 1:59 PM

## 2021-01-29 NOTE — Progress Notes (Signed)
NAME:  Dana Bruce, MRN:  [id number], DOB:  1966-08-04, LOS: 43 ADMISSION DATE:  01/23/2021, INITIAL CONSULTATION DATE:  02/04/2021 REFERRING MD:  Dr. Jari Pigg,  CHIEF COMPLAINT: Shortness of breath  Brief History:  55 year old unvaccinated female admitted with acute hypoxic respiratory failure in the setting of COVID-19 pneumonia   Subjective Findings:  01/15/2021- patient with worsening resp distress s/p ETT.  Has been started on lasix. Due to BMI >62 will hold proning. Patient with resp failure s/p ETT - I have updated husband [name] via phone today. 01/16/2021- patient was awake and had attempted to self extubate, sedation was increased. Midline was placed due to inadequate access no need for central line at this time. AM labs with worsening GFR, hyperglycemia.  Vitals stable. UOP adequate appx 1L overnight, CXR with bilateral multifocal infiltrates. Ventilator management performed.  01/17/2021- patient has been weaned on MV from FIO2-100% to 70%, repeat ABG this afternoon.  Called husband today (POA [name]) reviewed care plan and answered questions.  01/19/2021-appears volume overloaded, still with increased work of breathing when sedation is lightened 2/9FAILURE TO WEAN FROM VENT, SEVERE ALI 2/10 severe resp failure, failure to wean from vent 2/11 failed weaning trials +hyperkalemia 2/12 severe resp failure, failed weaning trials, PICC line placed+hyperkalemia 2/13 severe resp failure 01/27/2021- Patient is worsening, renal function declining nephrology consultation today, she is febrile remains critically ill on 50% fiO2, on OG Feeds with nepro, has PICC line,  Has been on numerous antibiotics and ID is following.  Goals of care discussion today with family.  UOP is only 300 overnight. Family updated today via phone.  01/28/2021- heavy frothy pink sputum per ETT. Sending tracheal aspirate. Called POA husband was unable to reach him today. Reviewed and updated medications with  phramD   Interval History:  Pt with worsening AKI (Creatine 4.64); plan is for short term HD as per Husband request Vascular Surgery consulted for placement of Dialysis catheter No events reported overnight Hemodynamically stable, not requiring pressors; Afebrile Palliative Care consulted for assistance with goals of care  Past Medical History:  Hypertension Diabetes mellitus Arthritis Goiter IBS    Consults:  PCCM Infectious Disease Nephrology Palliative Care  Procedures:  2/4: Endotracheal intubation 2/18: Temporary dialysis catheter placed by Vascular Surgery  Significant Diagnostic Tests:  2/3: Chest x-ray>>IMPRESSION: Diffuse bilateral ground-glass airspace opacities consistent with the patient's history of viral pneumonia. 2/8: Chest x-ray>> improved aeration from prior  Micro Data:  1/30: SARS-CoV-2 PCR>> positive 2/3: Blood culture x2>>no growth 2/3: Sputum>>normal respiratory flora 2/3: Strep pneumo urinary antigen>> 2/3: Legionella urinary antigen>> 2/8: Tracheal aspirate>>normal respiratory flora 2/8: Blood culture>>STAPHYLOCOCCUS EPIDERMIDIS (? Contaminant) 2/9: MRSA PCR>>negative 2/11: Blood cutlure>> no growth 2/14: Tracheal aspirate>>FEW CANDIDA ALBICANS   Antimicrobials:  Remdesivir 2/3>> 2/8 Levaquin 2/3>>2/8 Cefepime 2/6>>2/15  Objective   Blood pressure (!) 110/51, pulse (!) 112, temperature 99.32 F (37.4 C), resp. rate 18, height 5' 7"  (1.702 m), weight (!) 186.9 kg, SpO2 97 %.    Vent Mode: PRVC FiO2 (%):  [40 %-70 %] 50 % Set Rate:  [15 bmp] 15 bmp Vt Set:  [500 mL] 500 mL PEEP:  [5 cmH20] 5 cmH20 Plateau Pressure:  [17 cmH20] 17 cmH20   Intake/Output Summary (Last 24 hours) at 01/12/2021 0821 Last data filed at 01/21/2021 0651 Gross per 24 hour  Intake 2143.15 ml  Output 500 ml  Net 1643.15 ml   Filed Weights   01/25/21 0500 01/28/21 0334 01/25/2021 0406  Weight: (!) 189.6 kg Marland Kitchen)  186.9 kg (!) 186.9 kg     Examination: General: Critically ill-appearing morbidly obese female, laying in bed, intubated and sedated, in no acute distress HENT: Atraumatic, normocephalic, neck supple, no JVD, scleral edema, ET tube in place Lungs: Coarse breath sounds bilaterally, synchronous with vent, even Cardiovascular: Tachycardia, irregular irregular rhythm, no murmurs, rubs, gallops Abdomen: Obese, soft, nontender, nondistended, no guarding or rebound tenderness Extremities: No deformities, anasarca Neuro: Currently sedated, withdraws from pain, pupils PERRLA GU: Foley catheter in place Skin: Warm and dry.  No obvious rashes, lesions, ulcerations  IMAGING      Assessment & Plan:   Acute Hypoxic Respiratory Failure in the setting of COVID-19 Pneumonia -Mechanical ventilation via ARDS protocol, target PRVC 6 cc/kg -Wean PEEP and FiO2 as able to maintain O2 sats >88% -Goal plateau pressure less than 30, driving pressure less than 15 -Paralytics if necessary for vent synchrony, gas exchange -Cycle prone positioning if necessary for oxygenation -Deep sedation per PAD protocol, goal RASS -4, currently fentanyl & Precedex -Diuresis as blood pressure and renal function can tolerate, goal CVP 5-8.   -VAP prevention order set -Completed course of Remdesivir -Completed course of IV Steroids -Follow inflammatory markers: Ferritin, D-dimer, CRP -Vitamin C, zinc -Plan to repeat and check resp cultures>> Repeat tracheal aspirate on 01/25/21 with few candida albicans (ID following) -Unable to prone due to body habitus (BMI 62) -Maintain airborne and contact precautions -Pt will likely need Trach for survival    Atrial Fibrillation w/ RVR -Continuous cardiac monitoring -Maintain MAP >65 -On amiodarone gtt, will transition to PO amiodarone to reduce amount of volume pt is receiving -Continue Metoprolol and Cardizem PO  -On Treatment dose Lovenox for anticoagulation, considering transition to Heparin gtt  given worsening AKI   AKI -Monitor I&O's / urinary output -Follow BMP -Ensure adequate renal perfusion -Avoid nephrotoxic agents as able -Replace electrolytes as indicated -Nephrology following, appreciate input ~ Pt's husband wishes to pursue short term Dialysis; -Vascular Surgery consulted for placement of temporary HD catheter due to body habitus   Anemia without s/sx of Bleeding -Monitor for S/Sx of bleeding -Trend CBC -Lovenox for VTE Prophylaxis  -Transfuse for Hgb <7   Diabetes mellitus with severe hyperglycemia -CBG's -SSI & Lantus -Follow ICU Hypo/Hyperglycemia protocol   Morbid obesity BMI 62 -This issue adds complexity to her management -Unable to prone due to body habitus -Will make weaning challenging -PEEP minimum of 8 due to body habitus to avoid derecruitment         Best practice (evaluated daily)  Diet: Tube feeds Pain/Anxiety/Delirium protocol (if indicated): Precedex/fentanyl VAP protocol (if indicated): Yes, active DVT prophylaxis: Lovenox subcu GI prophylaxis: Pepcid Glucose control: Sliding scale insulin Mobility: Bed rest Disposition: ICU  Goals of Care:   Code Status: DNR Family update:  Updated pt's husband via telephone 2/18.  We discussed her poor prognosis, with worsening AKI necessitating HD.  Also discussed likelihood she will need tracheostomy for survival.  He is unsure whether she would want to have trach and to have to reside in a facility. Palliative Care consulted for further assistance with goals of treatment.  Labs   CBC: Recent Labs  Lab 01/25/21 0510 01/26/21 0436 01/27/21 0530 01/28/21 0305 01/25/2021 0409  WBC 12.9* 14.0* 13.4* 15.7* 16.7*  NEUTROABS 9.8* 9.4* 8.2* 11.4* 11.8*  HGB 9.8* 9.0* 8.3* 8.6* 8.4*  HCT 31.8* 30.4* 28.5* 27.9* 27.8*  MCV 88.3 88.6 89.6 86.9 87.1  PLT 282 308 286 306 655    Basic Metabolic Panel:  Recent Labs  Lab 01/25/21 0510 01/26/21 0436 01/27/21 0530 01/28/21 0305 01/21/2021 0409   NA 143 143 144 141 138  K 4.5 3.7 4.1 4.3 4.5  CL 107 111 112* 109 109  CO2 24 23 21* 21* 20*  GLUCOSE 249* 160* 160* 210* 196*  BUN 87* 96* 108* 130* 136*  CREATININE 1.68* 1.93* 2.88* 3.60* 4.64*  CALCIUM 9.5 9.7 9.7 10.0 9.9  MG 2.2 2.5* 2.5* 2.6* 2.7*  PHOS 6.3* 5.2* 6.4* 6.9* 8.2*   GFR: Estimated Creatinine Clearance: 24.2 mL/min (A) (by C-G formula based on SCr of 4.64 mg/dL (H)). Recent Labs  Lab 01/26/21 0436 01/27/21 0530 01/28/21 0305 02/06/2021 0409  PROCALCITON  --  0.30  --   --   WBC 14.0* 13.4* 15.7* 16.7*    Liver Function Tests: Recent Labs  Lab 01/25/21 0510 01/26/21 0436 01/27/21 0530 01/28/21 0305 01/19/2021 0409  ALBUMIN 2.3* 2.2* 2.1* 2.2* 2.4*   No results for input(s): LIPASE, AMYLASE in the last 168 hours. No results for input(s): AMMONIA in the last 168 hours.  ABG    Component Value Date/Time   PHART 7.33 (L) 01/18/2021 0428   PCO2ART 53 (H) 01/18/2021 0428   PO2ART 89 01/18/2021 0428   HCO3 27.9 01/18/2021 0428   ACIDBASEDEF 0.8 01/15/2021 1133   O2SAT 96.2 01/18/2021 0428     Coagulation Profile: No results for input(s): INR, PROTIME in the last 168 hours.  Cardiac Enzymes: No results for input(s): CKTOTAL, CKMB, CKMBINDEX, TROPONINI in the last 168 hours.  HbA1C: Hgb A1c MFr Bld  Date/Time Value Ref Range Status  02/05/2021 02:41 PM 6.3 (H) 4.8 - 5.6 % Final    Comment:    (NOTE) Pre diabetes:          5.7%-6.4%  Diabetes:              >6.4%  Glycemic control for   <7.0% adults with diabetes     CBG: Recent Labs  Lab 01/28/21 1625 01/28/21 1935 01/28/21 2308 02/06/2021 0331 01/20/2021 0732  GLUCAP 212* 171* 167* 166* 195*    Review of Systems:   Unable to assess due intubation, sedation, and critical illness   Allergies Allergies  Allergen Reactions  . Lasix [Furosemide] Itching  . Penicillins Hives and Itching    Has patient had a PCN reaction causing immediate rash, facial/tongue/throat swelling, SOB or  lightheadedness with hypotension: yes Has patient had a PCN reaction causing severe rash involving mucus membranes or skin necrosis: no Has patient had a PCN reaction that required hospitalization No Has patient had a PCN reaction occurring within the last 10 years: No If all of the above answers are "NO", then may proceed with Cephalosporin use.   [place] Flavor [Flavoring Agent] Itching, Nausea And Vomiting and Rash    Breaks out in places where she touches strawberries      Current medications  Scheduled Meds: . chlorhexidine gluconate (MEDLINE KIT)  15 mL Mouth Rinse BID  . Chlorhexidine Gluconate Cloth  6 each Topical Daily  . diltiazem  30 mg Per Tube Q6H  . enoxaparin (LOVENOX) injection  1 mg/kg Subcutaneous Q24H  . feeding supplement (PROSource TF)  45 mL Per Tube BID  . Gerhardt's butt cream   Topical QID  . insulin aspart  0-20 Units Subcutaneous Q4H  . insulin aspart  5 Units Subcutaneous Q4H  . insulin detemir  22 Units Subcutaneous Daily  . mouth rinse  15 mL Mouth Rinse 10 times  per day  . metoprolol tartrate  100 mg Per Tube BID  . sodium chloride flush  10-40 mL Intracatheter Q12H  . sodium chloride flush  10-40 mL Intracatheter Q12H  . sodium chloride flush  3 mL Intravenous Q12H   Continuous Infusions: . sodium chloride Stopped (01/24/2021 0528)  . amiodarone 30 mg/hr (01/21/2021 0651)  . dexmedetomidine (PRECEDEX) IV infusion 0.6 mcg/kg/hr (01/24/2021 0651)  . famotidine (PEPCID) IV Stopped (01/28/21 2059)  . feeding supplement (NEPRO CARB STEADY) 1,000 mL (01/19/2021 0523)  . fentaNYL infusion INTRAVENOUS 150 mcg/hr (01/25/2021 0651)  . midazolam Stopped (01/26/21 1625)   PRN Meds:.sodium chloride, acetaminophen (TYLENOL) oral liquid 160 mg/5 mL, acetaminophen, albuterol, guaiFENesin-dextromethorphan, midazolam, ondansetron (ZOFRAN) IV, sodium chloride flush, sodium chloride flush, sodium chloride flush    Critical care time: 35 minutes     Darel Hong,  AGACNP-BC [facility] Pager: [phone]

## 2021-01-30 ENCOUNTER — Inpatient Hospital Stay: Payer: Managed Care, Other (non HMO)

## 2021-01-30 DIAGNOSIS — U071 COVID-19: Secondary | ICD-10-CM | POA: Diagnosis not present

## 2021-01-30 LAB — CBC WITH DIFFERENTIAL/PLATELET
Abs Immature Granulocytes: 0.6 10*3/uL — ABNORMAL HIGH (ref 0.00–0.07)
Basophils Absolute: 0 10*3/uL (ref 0.0–0.1)
Basophils Relative: 0 %
Eosinophils Absolute: 0.7 10*3/uL — ABNORMAL HIGH (ref 0.0–0.5)
Eosinophils Relative: 5 %
HCT: 24.3 % — ABNORMAL LOW (ref 36.0–46.0)
Hemoglobin: 7.3 g/dL — ABNORMAL LOW (ref 12.0–15.0)
Immature Granulocytes: 4 %
Lymphocytes Relative: 12 %
Lymphs Abs: 1.7 10*3/uL (ref 0.7–4.0)
MCH: 26.4 pg (ref 26.0–34.0)
MCHC: 30 g/dL (ref 30.0–36.0)
MCV: 87.7 fL (ref 80.0–100.0)
Monocytes Absolute: 1.2 10*3/uL — ABNORMAL HIGH (ref 0.1–1.0)
Monocytes Relative: 9 %
Neutro Abs: 9.5 10*3/uL — ABNORMAL HIGH (ref 1.7–7.7)
Neutrophils Relative %: 70 %
Platelets: 268 10*3/uL (ref 150–400)
RBC: 2.77 MIL/uL — ABNORMAL LOW (ref 3.87–5.11)
RDW: 15.2 % (ref 11.5–15.5)
WBC: 13.6 10*3/uL — ABNORMAL HIGH (ref 4.0–10.5)
nRBC: 0.3 % — ABNORMAL HIGH (ref 0.0–0.2)

## 2021-01-30 LAB — RENAL FUNCTION PANEL
Albumin: 2.2 g/dL — ABNORMAL LOW (ref 3.5–5.0)
Anion gap: 11 (ref 5–15)
BUN: 153 mg/dL — ABNORMAL HIGH (ref 6–20)
CO2: 20 mmol/L — ABNORMAL LOW (ref 22–32)
Calcium: 9.4 mg/dL (ref 8.9–10.3)
Chloride: 105 mmol/L (ref 98–111)
Creatinine, Ser: 6.04 mg/dL — ABNORMAL HIGH (ref 0.44–1.00)
GFR, Estimated: 8 mL/min — ABNORMAL LOW (ref >60)
Glucose, Bld: 194 mg/dL — ABNORMAL HIGH (ref 70–99)
Phosphorus: 10.2 mg/dL — ABNORMAL HIGH (ref 2.5–4.6)
Potassium: 5.1 mmol/L (ref 3.5–5.1)
Sodium: 136 mmol/L (ref 135–145)

## 2021-01-30 LAB — FUNGITELL, SERUM: Fungitell Result: 33 pg/mL (ref <80)

## 2021-01-30 LAB — D-DIMER, QUANTITATIVE: D-Dimer, Quant: 0.59 ug/mL-FEU — ABNORMAL HIGH (ref 0.00–0.50)

## 2021-01-30 LAB — C-REACTIVE PROTEIN: CRP: 6 mg/dL — ABNORMAL HIGH (ref <1.0)

## 2021-01-30 LAB — PHOSPHORUS: Phosphorus: 10.1 mg/dL — ABNORMAL HIGH (ref 2.5–4.6)

## 2021-01-30 LAB — GLUCOSE, CAPILLARY
Glucose-Capillary: 174 mg/dL — ABNORMAL HIGH (ref 70–99)
Glucose-Capillary: 176 mg/dL — ABNORMAL HIGH (ref 70–99)
Glucose-Capillary: 173 mg/dL — ABNORMAL HIGH (ref 70–99)
Glucose-Capillary: 213 mg/dL — ABNORMAL HIGH (ref 70–99)
Glucose-Capillary: 219 mg/dL — ABNORMAL HIGH (ref 70–99)

## 2021-01-30 LAB — HEPARIN LEVEL (UNFRACTIONATED)
Heparin Unfractionated: 0.55 IU/mL (ref 0.30–0.70)
Heparin Unfractionated: 0.41 IU/mL (ref 0.30–0.70)

## 2021-01-30 LAB — MAGNESIUM: Magnesium: 2.7 mg/dL — ABNORMAL HIGH (ref 1.7–2.4)

## 2021-01-30 LAB — FERRITIN: Ferritin: 98 ng/mL (ref 11–307)

## 2021-01-30 MED ORDER — HEPARIN (PORCINE) 25000 UT/250ML-% IV SOLN
2150.0000 [IU]/h | INTRAVENOUS | Status: DC
  Administered 2021-01-30 – 2021-01-31 (×3): 1500 [IU]/h via INTRAVENOUS
  Administered 2021-02-01: 1900 [IU]/h via INTRAVENOUS
  Filled 2021-01-30 (×4): qty 250

## 2021-01-30 MED ORDER — NOREPINEPHRINE 16 MG/250ML-% IV SOLN
0.0000 ug/min | INTRAVENOUS | Status: DC
  Administered 2021-01-30: 2 ug/min via INTRAVENOUS
  Administered 2021-02-01: 6 ug/min via INTRAVENOUS
  Filled 2021-01-30: qty 250

## 2021-01-30 NOTE — Progress Notes (Signed)
NAME:  Dana Bruce, MRN:  [id number], DOB:  07/02/1966, LOS: 42 ADMISSION DATE:  02/05/2021, INITIAL CONSULTATION DATE:  01/27/2021 REFERRING MD:  Dr. Jari Pigg,  CHIEF COMPLAINT: Shortness of breath  Brief History:  55 year old unvaccinated female admitted with acute hypoxic respiratory failure in the setting of COVID-19 pneumonia   Subjective Findings:  01/15/2021- patient with worsening resp distress s/p ETT.  Has been started on lasix. Due to BMI >62 will hold proning. Patient with resp failure s/p ETT - I have updated husband [name] via phone today. 01/16/2021- patient was awake and had attempted to self extubate, sedation was increased. Midline was placed due to inadequate access no need for central line at this time. AM labs with worsening GFR, hyperglycemia.  Vitals stable. UOP adequate appx 1L overnight, CXR with bilateral multifocal infiltrates. Ventilator management performed.  01/17/2021- patient has been weaned on MV from FIO2-100% to 70%, repeat ABG this afternoon.  Called husband today (POA Daja Shuping) reviewed care plan and answered questions.  01/19/2021-appears volume overloaded, still with increased work of breathing when sedation is lightened 2/9FAILURE TO WEAN FROM VENT, SEVERE ALI 2/10 severe resp failure, failure to wean from vent 2/11 failed weaning trials +hyperkalemia 2/12 severe resp failure, failed weaning trials, PICC line placed+hyperkalemia 2/13 severe resp failure 01/27/2021- Patient is worsening, renal function declining nephrology consultation today, she is febrile remains critically ill on 50% fiO2, on OG Feeds with nepro, has PICC line,  Has been on numerous antibiotics and ID is following.  Goals of care discussion today with family.  UOP is only 300 overnight. Family updated today via phone.  01/28/2021- heavy frothy pink sputum per ETT. Sending tracheal aspirate. Called POA husband was unable to reach him today. Reviewed and updated medications with  phramD 01/30/2021-dialysis started today   Interval History:  Pt with worsening AKI (Creatine 6.04); plan is for short term HD started today  Dialysis catheter placed by vascular surgery No events reported overnight Hemodynamically stable, not requiring pressors; Afebrile Palliative Care consulted for assistance with goals of care  Past Medical History:  Hypertension Diabetes mellitus Arthritis Goiter IBS    Consults:  PCCM Infectious Disease Nephrology Palliative Care  Procedures:  2/4: Endotracheal intubation 2/18: Temporary dialysis catheter placed by Vascular Surgery  Significant Diagnostic Tests:  2/3: Chest x-ray>>IMPRESSION: Diffuse bilateral ground-glass airspace opacities consistent with the patient's history of viral pneumonia. 2/8: Chest x-ray>> improved aeration from prior  Micro Data:  1/30: SARS-CoV-2 PCR>> positive 2/3: Blood culture x2>>no growth 2/3: Sputum>>normal respiratory flora 2/3: Strep pneumo urinary antigen>> 2/3: Legionella urinary antigen>> 2/8: Tracheal aspirate>>normal respiratory flora 2/8: Blood culture>>STAPHYLOCOCCUS EPIDERMIDIS (? Contaminant) 2/9: MRSA PCR>>negative 2/11: Blood cutlure>> no growth 2/14: Tracheal aspirate>>FEW CANDIDA ALBICANS   Antimicrobials:  Remdesivir 2/3>> 2/8 Levaquin 2/3>>2/8 Cefepime 2/6>>2/15  Objective   Blood pressure (!) 99/54, pulse (!) 111, temperature 98.78 F (37.1 C), resp. rate 16, height 5' 7"  (1.702 m), weight (!) 189.1 kg, SpO2 93 %.    Vent Mode: PRVC FiO2 (%):  [50 %] 50 % Set Rate:  [15 bmp] 15 bmp Vt Set:  [500 mL] 500 mL PEEP:  [5 cmH20] 5 cmH20 Plateau Pressure:  [15 cmH20-18 cmH20] 15 cmH20   Intake/Output Summary (Last 24 hours) at 01/30/2021 1042 Last data filed at 01/30/2021 0517 Gross per 24 hour  Intake 1412.24 ml  Output 20 ml  Net 1392.24 ml   Filed Weights   01/28/21 0334 02/08/2021 0406 01/30/21 0343  Weight: (!) 186.9 kg Marland Kitchen)  186.9 kg (!) 189.1 kg     Examination: General: Critically ill-appearing morbidly obese female, laying in bed, intubated and sedated, in no acute distress HENT: Atraumatic, normocephalic, neck supple, no JVD, scleral edema, ET tube in place Lungs: Coarse breath sounds bilaterally, synchronous with vent, even Cardiovascular: Tachycardia, irregular irregular rhythm, no murmurs, rubs, gallops Abdomen: Obese, soft, nontender, nondistended, no guarding or rebound tenderness Extremities: No deformities, anasarca Neuro: Currently sedated, withdraws from pain, pupils PERRLA GU: Foley catheter in place Skin: Warm and dry.  No obvious rashes, lesions, ulcerations    IMAGING      Assessment & Plan:   Acute Hypoxic Respiratory Failure in the setting of COVID-19 Pneumonia -Continue mechanical ventilation -Wean PEEP and FiO2 as able to maintain O2 sats >88% -Goal plateau pressure less than 30, driving pressure less than 15 -Paralytics if necessary for vent synchrony, gas exchange -Sedation per PAD protocol, goal RASS -2, currently fentanyl & Precedex  -VAP prevention order set -Completed course of Remdesivir -Completed course of IV Steroids -Follow inflammatory markers: Ferritin, D-dimer, CRP -Vitamin C, zinc -Plan to repeat and check resp cultures>> Repeat tracheal aspirate on 01/25/21 with few candida albicans (ID following) -Unable to prone due to body habitus (BMI 62) -Maintain airborne and contact precautions -Pt will likely need Trach for survival    Atrial Fibrillation w/ RVR -Continuous cardiac monitoring -Maintain MAP >65 -On amiodarone gtt, will transition to PO amiodarone to reduce amount of volume pt is receiving -Continue Metoprolol and Cardizem PO  -On Treatment dose Lovenox for anticoagulation, considering transition to Heparin gtt given worsening AKI   AKI -Monitor I&O's / urinary output -Follow BMP -Ensure adequate renal perfusion -Avoid nephrotoxic agents as able -Replace  electrolytes as indicated -Nephrology following, appreciate input ~ Pt's husband wishes to pursue short term Dialysis; -Vascular Surgery placed temporary HD catheter   Anemia without s/sx of Bleeding -Monitor for S/Sx of bleeding -Trend CBC -Lovenox for VTE Prophylaxis  -Transfuse for Hgb <7   Diabetes mellitus with severe hyperglycemia -CBG's -SSI & Lantus -Follow ICU Hypo/Hyperglycemia protocol   Morbid obesity BMI 62 -This issue adds complexity to her management -Unable to prone due to body habitus -Will make weaning challenging -PEEP minimum of 8 due to body habitus to avoid derecruitment         Best practice (evaluated daily)  Diet: Tube feeds Pain/Anxiety/Delirium protocol (if indicated): Precedex/fentanyl VAP protocol (if indicated): Yes, active DVT prophylaxis: Lovenox subcu GI prophylaxis: Pepcid Glucose control: Sliding scale insulin Mobility: Bed rest Disposition: ICU  Goals of Care:   Code Status: DNR Family update:  Updated pt's husband via telephone 2/18.  We discussed her poor prognosis, with worsening AKI necessitating HD.  Also discussed likelihood she will need tracheostomy for survival.  He is unsure whether she would want to have trach and to have to reside in a facility. Palliative Care consulted for further assistance with goals of treatment.  Labs   CBC: Recent Labs  Lab 01/26/21 0436 01/27/21 0530 01/28/21 0305 01/15/2021 0409 01/30/21 0351  WBC 14.0* 13.4* 15.7* 16.7* 13.6*  NEUTROABS 9.4* 8.2* 11.4* 11.8* 9.5*  HGB 9.0* 8.3* 8.6* 8.4* 7.3*  HCT 30.4* 28.5* 27.9* 27.8* 24.3*  MCV 88.6 89.6 86.9 87.1 87.7  PLT 308 286 306 323 130    Basic Metabolic Panel: Recent Labs  Lab 01/26/21 0436 01/27/21 0530 01/28/21 0305 01/31/2021 0409 01/30/21 0351  NA 143 144 141 138 136  K 3.7 4.1 4.3 4.5 5.1  CL 111 112*  109 109 105  CO2 23 21* 21* 20* 20*  GLUCOSE 160* 160* 210* 196* 194*  BUN 96* 108* 130* 136* 153*  CREATININE 1.93* 2.88*  3.60* 4.64* 6.04*  CALCIUM 9.7 9.7 10.0 9.9 9.4  MG 2.5* 2.5* 2.6* 2.7* 2.7*  PHOS 5.2* 6.4* 6.9* 8.2* 10.2*   GFR: Estimated Creatinine Clearance: 18.7 mL/min (A) (by C-G formula based on SCr of 6.04 mg/dL (H)). Recent Labs  Lab 01/27/21 0530 01/28/21 0305 01/30/2021 0409 01/30/21 0351  PROCALCITON 0.30  --   --   --   WBC 13.4* 15.7* 16.7* 13.6*    Liver Function Tests: Recent Labs  Lab 01/26/21 0436 01/27/21 0530 01/28/21 0305 01/15/2021 0409 01/30/21 0351  ALBUMIN 2.2* 2.1* 2.2* 2.4* 2.2*   No results for input(s): LIPASE, AMYLASE in the last 168 hours. No results for input(s): AMMONIA in the last 168 hours.  ABG    Component Value Date/Time   PHART 7.33 (L) 01/18/2021 0428   PCO2ART 53 (H) 01/18/2021 0428   PO2ART 89 01/18/2021 0428   HCO3 27.9 01/18/2021 0428   ACIDBASEDEF 0.8 01/15/2021 1133   O2SAT 96.2 01/18/2021 0428     Coagulation Profile: Recent Labs  Lab 01/17/2021 1327  INR 1.4*    Cardiac Enzymes: No results for input(s): CKTOTAL, CKMB, CKMBINDEX, TROPONINI in the last 168 hours.  HbA1C: Hgb A1c MFr Bld  Date/Time Value Ref Range Status  02/06/2021 02:41 PM 6.3 (H) 4.8 - 5.6 % Final    Comment:    (NOTE) Pre diabetes:          5.7%-6.4%  Diabetes:              >6.4%  Glycemic control for   <7.0% adults with diabetes     CBG: Recent Labs  Lab 01/19/2021 1540 01/13/2021 1938 01/16/2021 2246 01/30/21 0336 01/30/21 0740  GLUCAP 154* 185* 143* 174* 176*    Review of Systems:   Unable to assess due intubation, sedation, and critical illness   Allergies Allergies  Allergen Reactions  . Lasix [Furosemide] Itching  . Penicillins Hives and Itching    Has patient had a PCN reaction causing immediate rash, facial/tongue/throat swelling, SOB or lightheadedness with hypotension: yes Has patient had a PCN reaction causing severe rash involving mucus membranes or skin necrosis: no Has patient had a PCN reaction that required hospitalization  No Has patient had a PCN reaction occurring within the last 10 years: No If all of the above answers are "NO", then may proceed with Cephalosporin use.   [place] Flavor [Flavoring Agent] Itching, Nausea And Vomiting and Rash    Breaks out in places where she touches strawberries      Current medications  Scheduled Meds: . amiodarone  400 mg Per Tube BID   Followed by  . [START ON 02/08/2021] amiodarone  200 mg Per Tube Daily  . chlorhexidine gluconate (MEDLINE KIT)  15 mL Mouth Rinse BID  . Chlorhexidine Gluconate Cloth  6 each Topical Q0600  . diltiazem  30 mg Per Tube Q6H  . feeding supplement (PROSource TF)  45 mL Per Tube BID  . Gerhardt's butt cream   Topical QID  . insulin aspart  0-20 Units Subcutaneous Q4H  . insulin aspart  5 Units Subcutaneous Q4H  . insulin detemir  22 Units Subcutaneous Daily  . mouth rinse  15 mL Mouth Rinse 10 times per day  . metoprolol tartrate  100 mg Per Tube BID  . sodium chloride flush  10-40 mL Intracatheter Q12H  . sodium chloride flush  10-40 mL Intracatheter Q12H  . sodium chloride flush  3 mL Intravenous Q12H   Continuous Infusions: . sodium chloride Stopped (01/18/2021 0528)  . dexmedetomidine (PRECEDEX) IV infusion 0.7 mcg/kg/hr (01/30/21 0845)  . famotidine (PEPCID) IV Stopped (01/13/2021 2244)  . feeding supplement (NEPRO CARB STEADY) 1,000 mL (02/06/2021 0523)  . fentaNYL infusion INTRAVENOUS 175 mcg/hr (01/30/21 0517)  . heparin    . norepinephrine (LEVOPHED) Adult infusion 2 mcg/min (01/30/21 1038)   PRN Meds:.sodium chloride, acetaminophen (TYLENOL) oral liquid 160 mg/5 mL, acetaminophen, albuterol, guaiFENesin-dextromethorphan, midazolam, ondansetron (ZOFRAN) IV, sodium chloride flush, sodium chloride flush, sodium chloride flush    Critical care time: 35 minutes    The patient is critically ill with multiple organ systems failure and requires high complexity decision making for assessment and support, frequent evaluation and  titration of therapies, application of advanced monitoring technologies and extensive interpretation of multiple databases. Critical Care Time devoted to patient care services described in this note is 35 minutes.    Renold Don, MD [facility]   *This note was dictated using voice recognition software/Dragon.  Despite best efforts to proofread, errors can occur which can change the meaning.  Any change was purely unintentional.

## 2021-01-30 NOTE — Consult Note (Signed)
PHARMACY CONSULT NOTE  Pharmacy Consult for Electrolyte Monitoring and Replacement   Recent Labs: Potassium (mmol/L)  Date Value  01/30/2021 5.1   Magnesium (mg/dL)  Date Value  01/65/5374 2.7 (H)   Calcium (mg/dL)  Date Value  82/70/7867 9.4   Albumin (g/dL)  Date Value  54/49/2010 2.2 (L)   Phosphorus (mg/dL)  Date Value  06/22/1974 10.2 (H)   Sodium (mmol/L)  Date Value  01/30/2021 136   Assessment: 55 yo F with COVID-19 presenting with acute hypoxic respiratory failure in the setting of COVID-19 pneumonia. PMH includes HTN, diabetes, and IBS. Pt intubated 2/4.   Pharmacy consulted for electrolyte monitoring and replacement.  Nutrition: Tube feeds   Goal of Therapy:  - K >4 - Mag >2 - all other electrolytes WNL  Plan:  2/19 AM labs:  K 5.1  Mag 2.7  Scr 4.64>6.04 (worsening) - No electrolyte replacement needed today, nephrology considering dialysis - Will follow-up electrolytes with tomorrow's AM labs  Albina Billet, PharmD, BCPS Clinical Pharmacist 01/30/2021 9:47 AM

## 2021-01-30 NOTE — Progress Notes (Signed)
[facility]  ROUNDING NOTE   Subjective:   gen- remains critically ill Neuro- sedated with precedex, fentanyl Cv: no pressors Gi- TF @ 65 cc/hr Renal - poor UOP Pulm: vent assisted Fio2 50%  Objective:  Vital signs in last 24 hours:  Temp:  [97.16 F (36.2 C)-100.58 F (38.1 C)] 100.04 F (37.8 C) (02/19 0700) Pulse Rate:  [86-133] 133 (02/19 0700) Resp:  [7-18] 15 (02/19 0700) BP: (95-140)/(49-72) 105/72 (02/19 0700) SpO2:  [90 %-95 %] 92 % (02/19 0836) FiO2 (%):  [50 %] 50 % (02/19 0836) Weight:  [189.1 kg] 189.1 kg (02/19 0343)  Weight change: 2.268 kg Filed Weights   01/28/21 0334 01/20/2021 0406 01/30/21 0343  Weight: (!) 186.9 kg (!) 186.9 kg (!) 189.1 kg    Intake/Output: I/O last 3 completed shifts: In: 3762.5 [I.V.:2357.5; NG/GT:1305; IV Piggyback:100] Out: 220 [Urine:220]   Intake/Output this shift:  No intake/output data recorded.  Physical Exam: General: Critically ill, morbidly obese  Head: ETT  Eyes: Conjunctival hemorrhage  Lungs:   PRVC FiO2 50 %   Heart: Tachycardia, irregular  Abdomen:  Soft, nontender, obese  Extremities:  trace peripheral edema.  Neurologic: Intubated and sedated  Skin: No lesions  Access: Rt IJ temp cath - 2/18 - Dr Delana Meyer    Basic Metabolic Panel: Recent Labs  Lab 01/26/21 0436 01/27/21 0530 01/28/21 0305 01/12/2021 0409 01/30/21 0351  NA 143 144 141 138 136  K 3.7 4.1 4.3 4.5 5.1  CL 111 112* 109 109 105  CO2 23 21* 21* 20* 20*  GLUCOSE 160* 160* 210* 196* 194*  BUN 96* 108* 130* 136* 153*  CREATININE 1.93* 2.88* 3.60* 4.64* 6.04*  CALCIUM 9.7 9.7 10.0 9.9 9.4  MG 2.5* 2.5* 2.6* 2.7* 2.7*  PHOS 5.2* 6.4* 6.9* 8.2* 10.2*    Liver Function Tests: Recent Labs  Lab 01/26/21 0436 01/27/21 0530 01/28/21 0305 01/21/2021 0409 01/30/21 0351  ALBUMIN 2.2* 2.1* 2.2* 2.4* 2.2*   No results for input(s): LIPASE, AMYLASE in the last 168 hours. No results for input(s): AMMONIA in the last 168  hours.  CBC: Recent Labs  Lab 01/26/21 0436 01/27/21 0530 01/28/21 0305 01/19/2021 0409 01/30/21 0351  WBC 14.0* 13.4* 15.7* 16.7* 13.6*  NEUTROABS 9.4* 8.2* 11.4* 11.8* 9.5*  HGB 9.0* 8.3* 8.6* 8.4* 7.3*  HCT 30.4* 28.5* 27.9* 27.8* 24.3*  MCV 88.6 89.6 86.9 87.1 87.7  PLT 308 286 306 323 268    Cardiac Enzymes: No results for input(s): CKTOTAL, CKMB, CKMBINDEX, TROPONINI in the last 168 hours.  BNP: Invalid input(s): POCBNP  CBG: Recent Labs  Lab 01/22/2021 1540 01/17/2021 1938 01/28/2021 2246 01/30/21 0336 01/30/21 0740  GLUCAP 154* 185* 143* 174* 176*    Microbiology: Results for orders placed or performed during the hospital encounter of 01/13/2021  Blood Culture (routine x 2)     Status: None   Collection Time: 02/06/2021  2:56 PM   Specimen: BLOOD  Result Value Ref Range Status   Specimen Description BLOOD BLOOD LEFT ARM  Final   Special Requests   Final    BOTTLES DRAWN AEROBIC AND ANAEROBIC Blood Culture results may not be optimal due to an inadequate volume of blood received in culture bottles   Culture   Final    NO GROWTH 5 DAYS Performed at [facility], [address] [place], [place] [address]    Report Status 01/19/2021 FINAL  Final  Blood Culture (routine x 2)     Status: None  Collection Time: 02/02/2021  2:59 PM   Specimen: BLOOD  Result Value Ref Range Status   Specimen Description BLOOD BLOOD LEFT FOREARM  Final   Special Requests   Final    BOTTLES DRAWN AEROBIC AND ANAEROBIC Blood Culture adequate volume   Culture   Final    NO GROWTH 5 DAYS Performed at [facility], [address], [place], [place] [address]    Report Status 01/19/2021 FINAL  Final  Culture, respiratory     Status: None   Collection Time: 01/15/21  5:50 PM   Specimen: Tracheal Aspirate; Respiratory  Result Value Ref Range Status   Specimen Description   Final    TRACHEAL ASPIRATE Performed at [facility], [address], [place],  [place] [address]    Special Requests   Final    NONE Performed at [facility], [address] [place], [place] [address]    Gram Stain   Final    FEW WBC PRESENT, PREDOMINANTLY PMN FEW GRAM NEGATIVE RODS FEW GRAM POSITIVE RODS RARE GRAM POSITIVE COCCI    Culture   Final    Normal respiratory flora-no Staph aureus or Pseudomonas seen Performed at [facility], [place] [address] [place], [place] [address]    Report Status 01/18/2021 FINAL  Final  Culture, respiratory (non-expectorated)     Status: None   Collection Time: 01/19/21  6:20 PM   Specimen: Tracheal Aspirate; Respiratory  Result Value Ref Range Status   Specimen Description   Final    TRACHEAL ASPIRATE Performed at [facility], [address], [place], [place] [address]    Special Requests   Final    NONE Performed at [facility], [address] [place], [place] [address]    Gram Stain   Final    ABUNDANT WBC PRESENT, PREDOMINANTLY PMN FEW GRAM POSITIVE COCCI RARE GRAM NEGATIVE RODS RARE GRAM POSITIVE RODS    Culture   Final    Normal respiratory flora-no Staph aureus or Pseudomonas seen Performed at [facility], [place] [address], [place], [place] [address]    Report Status 01/22/2021 FINAL  Final  CULTURE, BLOOD (ROUTINE X 2) w Reflex to ID Panel     Status: Abnormal   Collection Time: 01/19/21  6:45 PM   Specimen: BLOOD  Result Value Ref Range Status   Specimen Description   Final    BLOOD BLOOD LEFT HAND Performed at [facility], [address], [place], [place] [address]    Special Requests   Final    BOTTLES DRAWN AEROBIC AND ANAEROBIC Blood Culture adequate volume Performed at [facility], [address] [place], [place] [address]    Culture  Setup Time   Final    GRAM POSITIVE COCCI IN BOTH AEROBIC AND ANAEROBIC BOTTLES CRITICAL RESULT CALLED TO, READ BACK BY AND VERIFIED WITH: BRANDON BEERS AT 3734 ON 01/20/21 BY SS    Culture (A)  Final     STAPHYLOCOCCUS EPIDERMIDIS THE SIGNIFICANCE OF ISOLATING THIS ORGANISM FROM A SINGLE SET OF BLOOD CULTURES WHEN MULTIPLE SETS ARE DRAWN IS UNCERTAIN. PLEASE NOTIFY THE MICROBIOLOGY DEPARTMENT WITHIN ONE WEEK IF SPECIATION AND SENSITIVITIES ARE REQUIRED. Performed at [facility], [place] [address] [place],  [address]    Report Status 01/23/2021 FINAL  Final  Blood Culture ID Panel (Reflexed)     Status: Abnormal   Collection Time: 01/19/21  6:45 PM  Result Value Ref Range Status  Enterococcus faecalis NOT DETECTED NOT DETECTED Final   Enterococcus Faecium NOT DETECTED NOT DETECTED Final   Listeria monocytogenes NOT DETECTED NOT DETECTED Final   Staphylococcus species DETECTED (A) NOT DETECTED Final    Comment: CRITICAL RESULT CALLED TO, READ BACK BY AND VERIFIED WITH: BRANDON BEERS AT 1539 ON 01/20/21 BY SS    Staphylococcus aureus (BCID) NOT DETECTED NOT DETECTED Final   Staphylococcus epidermidis DETECTED (A) NOT DETECTED Final    Comment: Methicillin (oxacillin) resistant coagulase negative staphylococcus. Possible blood culture contaminant (unless isolated from more than one blood culture draw or clinical case suggests pathogenicity). No antibiotic treatment is indicated for blood  culture contaminants. CRITICAL RESULT CALLED TO, READ BACK BY AND VERIFIED WITH: BRANDON BEERS AT 6283 ON 01/20/21 BY SS    Staphylococcus lugdunensis NOT DETECTED NOT DETECTED Final   Streptococcus species NOT DETECTED NOT DETECTED Final   Streptococcus agalactiae NOT DETECTED NOT DETECTED Final   Streptococcus pneumoniae NOT DETECTED NOT DETECTED Final   Streptococcus pyogenes NOT DETECTED NOT DETECTED Final   A.calcoaceticus-baumannii NOT DETECTED NOT DETECTED Final   Bacteroides fragilis NOT DETECTED NOT DETECTED Final   Enterobacterales NOT DETECTED NOT DETECTED Final   Enterobacter cloacae complex NOT DETECTED NOT DETECTED Final   Escherichia coli NOT DETECTED NOT DETECTED Final    Klebsiella aerogenes NOT DETECTED NOT DETECTED Final   Klebsiella oxytoca NOT DETECTED NOT DETECTED Final   Klebsiella pneumoniae NOT DETECTED NOT DETECTED Final   Proteus species NOT DETECTED NOT DETECTED Final   Salmonella species NOT DETECTED NOT DETECTED Final   Serratia marcescens NOT DETECTED NOT DETECTED Final   Haemophilus influenzae NOT DETECTED NOT DETECTED Final   Neisseria meningitidis NOT DETECTED NOT DETECTED Final   Pseudomonas aeruginosa NOT DETECTED NOT DETECTED Final   Stenotrophomonas maltophilia NOT DETECTED NOT DETECTED Final   Candida albicans NOT DETECTED NOT DETECTED Final   Candida auris NOT DETECTED NOT DETECTED Final   Candida glabrata NOT DETECTED NOT DETECTED Final   Candida krusei NOT DETECTED NOT DETECTED Final   Candida parapsilosis NOT DETECTED NOT DETECTED Final   Candida tropicalis NOT DETECTED NOT DETECTED Final   Cryptococcus neoformans/gattii NOT DETECTED NOT DETECTED Final   Methicillin resistance mecA/C DETECTED (A) NOT DETECTED Final    Comment: CRITICAL RESULT CALLED TO, READ BACK BY AND VERIFIED WITH: BRANDON BEERS AT 1539 ON 01/20/21 BY SS Performed at [facility], [address] [place], [place] [address]   CULTURE, BLOOD (ROUTINE X 2) w Reflex to ID Panel     Status: None   Collection Time: 01/19/21  6:47 PM   Specimen: BLOOD  Result Value Ref Range Status   Specimen Description BLOOD BLOOD RIGHT HAND  Final   Special Requests   Final    BOTTLES DRAWN AEROBIC AND ANAEROBIC Blood Culture adequate volume   Culture   Final    NO GROWTH 5 DAYS Performed at [facility], [address] [place], [place] [address]    Report Status 01/24/2021 FINAL  Final  MRSA PCR Screening     Status: None   Collection Time: 01/20/21  7:58 AM   Specimen: Nasal Mucosa; Nasopharyngeal  Result Value Ref Range Status   MRSA by PCR NEGATIVE NEGATIVE Final    Comment:        The GeneXpert MRSA Assay (FDA approved for NASAL  specimens only), is one component of a comprehensive MRSA colonization surveillance program. It is not intended to diagnose MRSA infection nor to guide  or monitor treatment for MRSA infections. Performed at [facility], [address] [place], [place] [address]   CULTURE, BLOOD (ROUTINE X 2) w Reflex to ID Panel     Status: None   Collection Time: 01/22/21  1:02 PM   Specimen: BLOOD  Result Value Ref Range Status   Specimen Description BLOOD BLOOD LEFT HAND  Final   Special Requests   Final    BOTTLES DRAWN AEROBIC AND ANAEROBIC Blood Culture adequate volume   Culture   Final    NO GROWTH 5 DAYS Performed at [facility], [address] [place], [place] [address]    Report Status 01/27/2021 FINAL  Final  CULTURE, BLOOD (ROUTINE X 2) w Reflex to ID Panel     Status: None   Collection Time: 01/22/21  1:02 PM   Specimen: BLOOD  Result Value Ref Range Status   Specimen Description BLOOD BLOOD RIGHT HAND  Final   Special Requests   Final    BOTTLES DRAWN AEROBIC AND ANAEROBIC Blood Culture adequate volume   Culture   Final    NO GROWTH 5 DAYS Performed at [facility] [address] [place], [place] [address]    Report Status 01/27/2021 FINAL  Final  Culture, respiratory (non-expectorated)     Status: None   Collection Time: 01/25/21 10:30 AM   Specimen: Tracheal Aspirate; Respiratory  Result Value Ref Range Status   Specimen Description   Final    TRACHEAL ASPIRATE Performed at [facility], [address] [place], [place] [address]    Special Requests   Final    NONE Performed at [facility], [address] [place], [place] [address]    Gram Stain   Final    MODERATE WBC PRESENT,BOTH PMN AND MONONUCLEAR NO ORGANISMS SEEN Performed at [facility], [place] [address], [place], [place] [address]    Culture FEW CANDIDA ALBICANS  Final   Report Status 01/28/2021 FINAL  Final    Coagulation Studies: Recent  Labs    01/15/2021 1327  LABPROT 16.9*  INR 1.4*    Urinalysis: No results for input(s): COLORURINE, LABSPEC, PHURINE, GLUCOSEU, HGBUR, BILIRUBINUR, KETONESUR, PROTEINUR, UROBILINOGEN, NITRITE, LEUKOCYTESUR in the last 72 hours.  Invalid input(s): APPERANCEUR    Imaging: PERIPHERAL VASCULAR CATHETERIZATION  Result Date: 01/17/2021 See op note  DG Chest Port 1 View  Result Date: 01/30/2021 CLINICAL DATA:  Acute respiratory failure with hypoxia EXAM: PORTABLE CHEST 1 VIEW COMPARISON:  Radiograph 01/28/2021 FINDINGS: *Endotracheal tube tip terminates in the mid trachea, 3.2 cm from the expected location of the carina. *A right IJ catheter tip terminates in the lower SVC. *Right upper extremity PICC tip terminates at the level of the right atrium. *Transesophageal tube tip and side port terminate below the margins of imaging, beyond the GE junction. * Telemetry leads and external support devices overlie the chest. Persistent heterogeneous bilateral airspace opacities, left greater than right. Some increasing coalescence is noted in the right mid lung and left basilar periphery. Stable cardiomediastinal contours with mild cardiomegaly and a calcified aorta. No visible pneumothorax or effusion the portions of the left costophrenic sulcus are collimated. No acute osseous or soft tissue abnormality. IMPRESSION: 1. Persistent heterogeneous bilateral airspace opacities, left greater than right. Some increasing coalescence in the right mid lung and left basilar periphery. 2. Stable cardiomegaly. 3. Support devices, as above. Electronically Signed   By: Lovena Le M.D.   On: 01/30/2021 02:03  Medications:   . sodium chloride Stopped (01/23/2021 0528)  . dexmedetomidine (PRECEDEX) IV infusion 0.7 mcg/kg/hr (01/30/21 0845)  . famotidine (PEPCID) IV Stopped (01/24/2021 2244)  . feeding supplement (NEPRO CARB STEADY) 1,000 mL (01/28/2021 0523)  . fentaNYL infusion INTRAVENOUS 175 mcg/hr (01/30/21 0517)    . amiodarone  400 mg Per Tube BID   Followed by  . [START ON 02/08/2021] amiodarone  200 mg Per Tube Daily  . chlorhexidine gluconate (MEDLINE KIT)  15 mL Mouth Rinse BID  . Chlorhexidine Gluconate Cloth  6 each Topical Daily  . Chlorhexidine Gluconate Cloth  6 each Topical Q0600  . diltiazem  30 mg Per Tube Q6H  . feeding supplement (PROSource TF)  45 mL Per Tube BID  . Gerhardt's butt cream   Topical QID  . insulin aspart  0-20 Units Subcutaneous Q4H  . insulin aspart  5 Units Subcutaneous Q4H  . insulin detemir  22 Units Subcutaneous Daily  . mouth rinse  15 mL Mouth Rinse 10 times per day  . metoprolol tartrate  100 mg Per Tube BID  . sodium chloride flush  10-40 mL Intracatheter Q12H  . sodium chloride flush  10-40 mL Intracatheter Q12H  . sodium chloride flush  3 mL Intravenous Q12H   sodium chloride, acetaminophen (TYLENOL) oral liquid 160 mg/5 mL, acetaminophen, albuterol, guaiFENesin-dextromethorphan, midazolam, ondansetron (ZOFRAN) IV, sodium chloride flush, sodium chloride flush, sodium chloride flush  Assessment/ Plan:  Ms. Dana Bruce is a 55 y.o. white female with hypertension, IBS, diabetes mellitus type II, who was admitted to [facility] on 01/31/2021 for Acute respiratory failure with hypoxia ([place]) [J96.01] Pneumonia due to COVID-19 virus [U07.1, J12.82] COVID-19 [U07.1]  1. Acute kidney injury:  Nonoliguric urine output. Responded to diuretics but she is now with azotemia and worsening creatinine  02/18 0701 - 02/19 0700 In: 1819.4 [I.V.:919.4; NG/GT:850; IV Piggyback:50] Out: 20 [Urine:20]  Lab Results  Component Value Date   CREATININE 6.04 (H) 01/30/2021   CREATININE 4.64 (H) 02/05/2021   CREATININE 3.60 (H) 01/28/2021     Declining urine output.  Hyperkalemia has improved.  - Hold home medication of telmisartan. - Patient may benefit from a short course of intermittent dialysis.  HD # 1 today. Orders prepared  #. 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 On metformin. Hemoglobin A1c at goal at 6.3%.  - holding metformin.   #. Acute respiratory failure: requiring intubation and mechanical ventilation. COVID-19 infection, ARDS.    LOS: [place] 2/19/20229:33 AM

## 2021-01-30 NOTE — Consult Note (Addendum)
ANTICOAGULATION CONSULT NOTE   Pharmacy Consult for IV heparin Indication: atrial fibrillation  Patient Measurements: Height: 5\' 7"  (170.2 cm) Weight: (!) 189.1 kg (417 lb) IBW/kg (Calculated) : 61.6  Labs: Recent Labs    01/27/21 1017 01/28/21 0305 01/28/21 0305 01/28/21 1058 01/22/2021 0409 01/22/2021 1327 01/30/21 0351  HGB  --  8.6*   < >  --  8.4*  --  7.3*  HCT  --  27.9*  --   --  27.8*  --  24.3*  PLT  --  306  --   --  323  --  268  APTT  --   --   --   --   --  48*  --   LABPROT  --   --   --   --   --  16.9*  --   INR  --   --   --   --   --  1.4*  --   HEPARINUNFRC  --   --   --   --   --  0.92* 0.55  HEPRLOWMOCWT 0.84  --   --  1.85  --   --   --   CREATININE  --  3.60*  --   --  4.64*  --  6.04*   < > = values in this interval not displayed.    Estimated Creatinine Clearance: 18.7 mL/min (A) (by C-G formula based on SCr of 6.04 mg/dL (H)).   Medical History: Past Medical History:  Diagnosis Date  . Arthritis    hips  . Diabetes mellitus without complication (HCC)    type 2  . Family history of adverse reaction to anesthesia    mother has ponv  . Goiter    followed by dr gerkin q year  . Hypertension   . IBS (irritable bowel syndrome)   . PONV (postoperative nausea and vomiting)    ponv 1 day after last dental surgery    Assessment: Pt is a 55 y.o. F presenting to the hospital due to acute respiratory failure with hypoxia secondary to COVID-19 pneumonia. PMH includes HTN, T2DM, and IBS. During sedation adjustments, pt developed A.fib RVR, with no PMH of A.fib on file. She was initiated on amiodarone bolus and drip.  Pt renal function worsening (1.93 >>2.88 >>3.60). Pt is also fluid overloaded. Given worsening renal function, low threshold to switch anticoagulation to IV heparin or apixaban. Concerns with IV heparin include worsening volume overload. Concerns with apixaban include lack of easy reversal.   Discussed on rounds. Will switch LMWH to IV  heparin. Nephrology planning to proceed with RRT.  Date LMWH level (4 hr) Comment Heparin  2/16 at 1017 0.84 Therapeutic   2/17 at 1058 1.85 Supratherapeutic   2/18 at 1327 0.92 Last dose 2/17 0600   2/19 at 0351 HL 0.55 Therapeutic    Heparin dosing weight 110.6kg (base don IBW 61.6kg and actual weight 189.1kg)  HGB 8.4>7.3  Goal of Therapy:  Heparin level 0.3-0.7 units/ml Monitor platelets by anticoagulation protocol: Yes   Plan:   --Anti-Xa level = 0.55 today at 0351 therapeutic - Will start Heparin drip @ 1500 units/hr -- Will recheck HL in 6 hours per protocol - CBC's daily (monitoring HGB closely)   3/19, PharmD, BCPS Clinical Pharmacist 01/30/2021 9:45 AM

## 2021-01-30 NOTE — Consult Note (Signed)
ANTICOAGULATION CONSULT NOTE   Pharmacy Consult for IV heparin Indication: atrial fibrillation  Patient Measurements: Height: 5\' 7"  (170.2 cm) Weight: (!) 189.1 kg (417 lb) IBW/kg (Calculated) : 61.6  Labs: Recent Labs    01/28/21 0305 01/28/21 1058 02-11-2021 0409 February 11, 2021 1327 01/30/21 0351 01/30/21 1700  HGB 8.6*  --  8.4*  --  7.3*  --   HCT 27.9*  --  27.8*  --  24.3*  --   PLT 306  --  323  --  268  --   APTT  --   --   --  48*  --   --   LABPROT  --   --   --  16.9*  --   --   INR  --   --   --  1.4*  --   --   HEPARINUNFRC  --   --   --  0.92* 0.55 0.41  HEPRLOWMOCWT  --  1.85  --   --   --   --   CREATININE 3.60*  --  4.64*  --  6.04*  --     Estimated Creatinine Clearance: 18.7 mL/min (A) (by C-G formula based on SCr of 6.04 mg/dL (H)).   Medical History: Past Medical History:  Diagnosis Date  . Arthritis    hips  . Diabetes mellitus without complication (HCC)    type 2  . Family history of adverse reaction to anesthesia    mother has ponv  . Goiter    followed by dr gerkin q year  . Hypertension   . IBS (irritable bowel syndrome)   . PONV (postoperative nausea and vomiting)    ponv 1 day after last dental surgery    Assessment: Pt is a 55 y.o. F presenting to the hospital due to acute respiratory failure with hypoxia secondary to COVID-19 pneumonia. PMH includes HTN, T2DM, and IBS. During sedation adjustments, pt developed A.fib RVR, with no PMH of A.fib on file. She was initiated on amiodarone bolus and drip.  Pt renal function worsening (1.93 >>2.88 >>3.60). Pt is also fluid overloaded. Given worsening renal function, low threshold to switch anticoagulation to IV heparin or apixaban. Concerns with IV heparin include worsening volume overload. Concerns with apixaban include lack of easy reversal.   Discussed on rounds. Will switch LMWH to IV heparin. Nephrology planning to proceed with RRT.  Date LMWH level (4 hr) Comment  2/16 at 1017 0.84  Therapeutic  2/17 at 1058 1.85 Supratherapeutic  2/18 at 1327 0.92 Last dose 2/17 0600  2/19 at 0351 0.55 therapeutic  Heparin dosing weight 110.6kg (base don IBW 61.6kg and actual weight 189.1kg) HGB 8.4>7.3   Levels since starting on IV heparin 2/19 at 1700 HL 0.41, therapeutic  Goal of Therapy:  Heparin level 0.3-0.7 units/ml Monitor platelets by anticoagulation protocol: Yes   Plan:  -HL @ 1700 0.41, therapeutic; continue heparin at current rate of 1500 units/hr -Will recheck HL in 8 hours to confirm therapeutic on heparin -CBC's daily (monitoring HGB closely)   3/19, PharmD Pharmacy Resident  01/30/2021 5:38 PM

## 2021-01-31 DIAGNOSIS — U071 COVID-19: Secondary | ICD-10-CM | POA: Diagnosis not present

## 2021-01-31 LAB — RENAL FUNCTION PANEL
Albumin: 2 g/dL — ABNORMAL LOW (ref 3.5–5.0)
Anion gap: 11 (ref 5–15)
BUN: 130 mg/dL — ABNORMAL HIGH (ref 6–20)
CO2: 20 mmol/L — ABNORMAL LOW (ref 22–32)
Calcium: 8.8 mg/dL — ABNORMAL LOW (ref 8.9–10.3)
Chloride: 105 mmol/L (ref 98–111)
Creatinine, Ser: 5.9 mg/dL — ABNORMAL HIGH (ref 0.44–1.00)
GFR, Estimated: 8 mL/min — ABNORMAL LOW (ref >60)
Glucose, Bld: 225 mg/dL — ABNORMAL HIGH (ref 70–99)
Phosphorus: 9.5 mg/dL — ABNORMAL HIGH (ref 2.5–4.6)
Potassium: 5.2 mmol/L — ABNORMAL HIGH (ref 3.5–5.1)
Sodium: 136 mmol/L (ref 135–145)

## 2021-01-31 LAB — CBC WITH DIFFERENTIAL/PLATELET
Abs Immature Granulocytes: 0.81 10*3/uL — ABNORMAL HIGH (ref 0.00–0.07)
Basophils Absolute: 0.1 10*3/uL (ref 0.0–0.1)
Basophils Relative: 1 %
Eosinophils Absolute: 0.8 10*3/uL — ABNORMAL HIGH (ref 0.0–0.5)
Eosinophils Relative: 5 %
HCT: 25.2 % — ABNORMAL LOW (ref 36.0–46.0)
Hemoglobin: 7.9 g/dL — ABNORMAL LOW (ref 12.0–15.0)
Immature Granulocytes: 5 %
Lymphocytes Relative: 7 %
Lymphs Abs: 1.3 10*3/uL (ref 0.7–4.0)
MCH: 27.2 pg (ref 26.0–34.0)
MCHC: 31.3 g/dL (ref 30.0–36.0)
MCV: 86.9 fL (ref 80.0–100.0)
Monocytes Absolute: 1.6 10*3/uL — ABNORMAL HIGH (ref 0.1–1.0)
Monocytes Relative: 9 %
Neutro Abs: 13.2 10*3/uL — ABNORMAL HIGH (ref 1.7–7.7)
Neutrophils Relative %: 73 %
Platelets: 274 10*3/uL (ref 150–400)
RBC: 2.9 MIL/uL — ABNORMAL LOW (ref 3.87–5.11)
RDW: 15.3 % (ref 11.5–15.5)
WBC: 17.9 10*3/uL — ABNORMAL HIGH (ref 4.0–10.5)
nRBC: 1 % — ABNORMAL HIGH (ref 0.0–0.2)

## 2021-01-31 LAB — D-DIMER, QUANTITATIVE: D-Dimer, Quant: 0.95 ug/mL-FEU — ABNORMAL HIGH (ref 0.00–0.50)

## 2021-01-31 LAB — C-REACTIVE PROTEIN: CRP: 17.1 mg/dL — ABNORMAL HIGH (ref <1.0)

## 2021-01-31 LAB — GLUCOSE, CAPILLARY
Glucose-Capillary: 173 mg/dL — ABNORMAL HIGH (ref 70–99)
Glucose-Capillary: 196 mg/dL — ABNORMAL HIGH (ref 70–99)
Glucose-Capillary: 197 mg/dL — ABNORMAL HIGH (ref 70–99)
Glucose-Capillary: 200 mg/dL — ABNORMAL HIGH (ref 70–99)
Glucose-Capillary: 209 mg/dL — ABNORMAL HIGH (ref 70–99)
Glucose-Capillary: 215 mg/dL — ABNORMAL HIGH (ref 70–99)
Glucose-Capillary: 222 mg/dL — ABNORMAL HIGH (ref 70–99)

## 2021-01-31 LAB — MAGNESIUM: Magnesium: 2.7 mg/dL — ABNORMAL HIGH (ref 1.7–2.4)

## 2021-01-31 LAB — PROCALCITONIN: Procalcitonin: 1.43 ng/mL

## 2021-01-31 LAB — FERRITIN: Ferritin: 110 ng/mL (ref 11–307)

## 2021-01-31 LAB — HEPARIN LEVEL (UNFRACTIONATED): Heparin Unfractionated: 0.38 IU/mL (ref 0.30–0.70)

## 2021-01-31 MED ORDER — GLYCOPYRROLATE 0.2 MG/ML IJ SOLN
0.2000 mg | INTRAMUSCULAR | Status: DC | PRN
  Administered 2021-01-31: 0.2 mg via INTRAVENOUS
  Filled 2021-01-31: qty 1

## 2021-01-31 MED ORDER — POLYVINYL ALCOHOL 1.4 % OP SOLN
1.0000 [drp] | OPHTHALMIC | Status: DC | PRN
  Administered 2021-01-31: 1 [drp] via OPHTHALMIC
  Filled 2021-01-31: qty 15

## 2021-01-31 MED ORDER — METOPROLOL TARTRATE 50 MG PO TABS
50.0000 mg | ORAL_TABLET | Freq: Two times a day (BID) | ORAL | Status: DC
  Administered 2021-01-31: 50 mg
  Filled 2021-01-31: qty 1

## 2021-01-31 NOTE — Progress Notes (Signed)
Patient PO2 saturation down to 84%, patient has copious oral secretions and via ETT. Patient PO2 up to 89-93%. Patient needing to be suctioned very frequently. Patients sclera edematous, red, with yellow, and bloody drainage. Patient HR up to 140's, RR up to 30's. Increased Fentanyl DTT and gave PRN Versed. Cheryll Cockayne NP made aware.

## 2021-01-31 NOTE — Progress Notes (Signed)
[facility]  ROUNDING NOTE   Subjective:   gen- remains critically ill Neuro- sedated with precedex, fentanyl Cv: now on norepinephrine Gi- TF @ 65 cc/hr Renal - poor UOP Pulm: vent assisted Fio2 60 %  Objective:  Vital signs in last 24 hours:  Temp:  [97.3 F (36.3 C)-101.12 F (38.4 C)] 101.12 F (38.4 C) (02/20 0600) Pulse Rate:  [46-166] 108 (02/20 0600) Resp:  [6-25] 15 (02/20 0600) BP: (87-132)/(43-82) 94/58 (02/20 0645) SpO2:  [91 %-96 %] 94 % (02/20 0600) FiO2 (%):  [50 %-60 %] 60 % (02/20 0405)  Weight change:  Filed Weights   01/28/21 0334 02/02/2021 0406 01/30/21 0343  Weight: (!) 186.9 kg (!) 186.9 kg (!) 189.1 kg    Intake/Output: I/O last 3 completed shifts: In: 2683 [I.V.:2457; NG/GT:2030; IV Piggyback:100] Out: 60 [Urine:60]   Intake/Output this shift:  No intake/output data recorded.  Physical Exam: General: Critically ill, morbidly obese  Head: ETT, OGT  Eyes: Conjunctival hemorrhage  Lungs:   PRVC FiO2 60 %   Heart: Tachycardia, irregular  Abdomen:  Soft, nontender, obese  Extremities:  trace peripheral edema.  Neurologic:   sedated  Skin: No lesions  Access: Rt IJ temp cath - 2/18 - Dr Delana Meyer    Basic Metabolic Panel: Recent Labs  Lab 01/27/21 0530 01/28/21 0305 01/28/2021 0409 01/30/21 0351 01/30/21 1108 01/31/21 0348 01/31/21 0350  NA 144 141 138 136  --   --  136  K 4.1 4.3 4.5 5.1  --   --  5.2*  CL 112* 109 109 105  --   --  105  CO2 21* 21* 20* 20*  --   --  20*  GLUCOSE 160* 210* 196* 194*  --   --  225*  BUN 108* 130* 136* 153*  --   --  130*  CREATININE 2.88* 3.60* 4.64* 6.04*  --   --  5.90*  CALCIUM 9.7 10.0 9.9 9.4  --   --  8.8*  MG 2.5* 2.6* 2.7* 2.7*  --  2.7*  --   PHOS 6.4* 6.9* 8.2* 10.2* 10.1*  --  9.5*    Liver Function Tests: Recent Labs  Lab 01/27/21 0530 01/28/21 0305 01/27/2021 0409 01/30/21 0351 01/31/21 0350  ALBUMIN 2.1* 2.2* 2.4* 2.2* 2.0*   No results for input(s): LIPASE,  AMYLASE in the last 168 hours. No results for input(s): AMMONIA in the last 168 hours.  CBC: Recent Labs  Lab 01/27/21 0530 01/28/21 0305 02/04/2021 0409 01/30/21 0351 01/31/21 0348  WBC 13.4* 15.7* 16.7* 13.6* 17.9*  NEUTROABS 8.2* 11.4* 11.8* 9.5* 13.2*  HGB 8.3* 8.6* 8.4* 7.3* 7.9*  HCT 28.5* 27.9* 27.8* 24.3* 25.2*  MCV 89.6 86.9 87.1 87.7 86.9  PLT 286 306 323 268 274    Cardiac Enzymes: No results for input(s): CKTOTAL, CKMB, CKMBINDEX, TROPONINI in the last 168 hours.  BNP: Invalid input(s): POCBNP  CBG: Recent Labs  Lab 01/30/21 1607 01/30/21 1939 01/30/21 2349 01/31/21 0325 01/31/21 0729  GLUCAP 213* 219* 196* 67* 200*    Microbiology: Results for orders placed or performed during the hospital encounter of 02/05/2021  Blood Culture (routine x 2)     Status: None   Collection Time: 01/23/2021  2:56 PM   Specimen: BLOOD  Result Value Ref Range Status   Specimen Description BLOOD BLOOD LEFT ARM  Final   Special Requests   Final    BOTTLES DRAWN AEROBIC AND ANAEROBIC Blood Culture results may not be optimal  due to an inadequate volume of blood received in culture bottles   Culture   Final    NO GROWTH 5 DAYS Performed at [facility], [address] [place], [place] [address]    Report Status 01/19/2021 FINAL  Final  Blood Culture (routine x 2)     Status: None   Collection Time: 01/24/2021  2:59 PM   Specimen: BLOOD  Result Value Ref Range Status   Specimen Description BLOOD BLOOD LEFT FOREARM  Final   Special Requests   Final    BOTTLES DRAWN AEROBIC AND ANAEROBIC Blood Culture adequate volume   Culture   Final    NO GROWTH 5 DAYS Performed at [facility], [address], [place], [place] [address]    Report Status 01/19/2021 FINAL  Final  Culture, respiratory     Status: None   Collection Time: 01/15/21  5:50 PM   Specimen: Tracheal Aspirate; Respiratory  Result Value Ref Range Status   Specimen Description   Final    TRACHEAL  ASPIRATE Performed at [facility], [address], [place], [place] [address]    Special Requests   Final    NONE Performed at [facility], [address] [place], [place] [address]    Gram Stain   Final    FEW WBC PRESENT, PREDOMINANTLY PMN FEW GRAM NEGATIVE RODS FEW GRAM POSITIVE RODS RARE GRAM POSITIVE COCCI    Culture   Final    Normal respiratory flora-no Staph aureus or Pseudomonas seen Performed at [facility], [place] [address] [place], [place] [address]    Report Status 01/18/2021 FINAL  Final  Culture, respiratory (non-expectorated)     Status: None   Collection Time: 01/19/21  6:20 PM   Specimen: Tracheal Aspirate; Respiratory  Result Value Ref Range Status   Specimen Description   Final    TRACHEAL ASPIRATE Performed at [facility], [address], [place], [place] [address]    Special Requests   Final    NONE Performed at [facility], [address] [place], [place] [address]    Gram Stain   Final    ABUNDANT WBC PRESENT, PREDOMINANTLY PMN FEW GRAM POSITIVE COCCI RARE GRAM NEGATIVE RODS RARE GRAM POSITIVE RODS    Culture   Final    Normal respiratory flora-no Staph aureus or Pseudomonas seen Performed at [facility], [place] [address], [place], [place] [address]    Report Status 01/22/2021 FINAL  Final  CULTURE, BLOOD (ROUTINE X 2) w Reflex to ID Panel     Status: Abnormal   Collection Time: 01/19/21  6:45 PM   Specimen: BLOOD  Result Value Ref Range Status   Specimen Description   Final    BLOOD BLOOD LEFT HAND Performed at [facility], [address] [place], [place] [address]    Special Requests   Final    BOTTLES DRAWN AEROBIC AND ANAEROBIC Blood Culture adequate volume Performed at [facility], [address] [place], [place] [address]    Culture  Setup Time   Final    GRAM POSITIVE COCCI IN BOTH AEROBIC AND ANAEROBIC BOTTLES CRITICAL RESULT CALLED TO, READ BACK BY  AND VERIFIED WITH: BRANDON BEERS AT 0272 ON 01/20/21 BY SS    Culture (A)  Final    STAPHYLOCOCCUS EPIDERMIDIS THE SIGNIFICANCE OF ISOLATING THIS ORGANISM FROM A SINGLE SET OF BLOOD CULTURES WHEN MULTIPLE SETS ARE DRAWN IS UNCERTAIN. PLEASE NOTIFY THE MICROBIOLOGY  DEPARTMENT WITHIN ONE WEEK IF SPECIATION AND SENSITIVITIES ARE REQUIRED. Performed at [facility], [place] [address], [place], [place] [address]    Report Status 01/23/2021 FINAL  Final  Blood Culture ID Panel (Reflexed)     Status: Abnormal   Collection Time: 01/19/21  6:45 PM  Result Value Ref Range Status   Enterococcus faecalis NOT DETECTED NOT DETECTED Final   Enterococcus Faecium NOT DETECTED NOT DETECTED Final   Listeria monocytogenes NOT DETECTED NOT DETECTED Final   Staphylococcus species DETECTED (A) NOT DETECTED Final    Comment: CRITICAL RESULT CALLED TO, READ BACK BY AND VERIFIED WITH: BRANDON BEERS AT 1539 ON 01/20/21 BY SS    Staphylococcus aureus (BCID) NOT DETECTED NOT DETECTED Final   Staphylococcus epidermidis DETECTED (A) NOT DETECTED Final    Comment: Methicillin (oxacillin) resistant coagulase negative staphylococcus. Possible blood culture contaminant (unless isolated from more than one blood culture draw or clinical case suggests pathogenicity). No antibiotic treatment is indicated for blood  culture contaminants. CRITICAL RESULT CALLED TO, READ BACK BY AND VERIFIED WITH: BRANDON BEERS AT 9628 ON 01/20/21 BY SS    Staphylococcus lugdunensis NOT DETECTED NOT DETECTED Final   Streptococcus species NOT DETECTED NOT DETECTED Final   Streptococcus agalactiae NOT DETECTED NOT DETECTED Final   Streptococcus pneumoniae NOT DETECTED NOT DETECTED Final   Streptococcus pyogenes NOT DETECTED NOT DETECTED Final   A.calcoaceticus-baumannii NOT DETECTED NOT DETECTED Final   Bacteroides fragilis NOT DETECTED NOT DETECTED Final   Enterobacterales NOT DETECTED NOT DETECTED Final   Enterobacter cloacae complex NOT  DETECTED NOT DETECTED Final   Escherichia coli NOT DETECTED NOT DETECTED Final   Klebsiella aerogenes NOT DETECTED NOT DETECTED Final   Klebsiella oxytoca NOT DETECTED NOT DETECTED Final   Klebsiella pneumoniae NOT DETECTED NOT DETECTED Final   Proteus species NOT DETECTED NOT DETECTED Final   Salmonella species NOT DETECTED NOT DETECTED Final   Serratia marcescens NOT DETECTED NOT DETECTED Final   Haemophilus influenzae NOT DETECTED NOT DETECTED Final   Neisseria meningitidis NOT DETECTED NOT DETECTED Final   Pseudomonas aeruginosa NOT DETECTED NOT DETECTED Final   Stenotrophomonas maltophilia NOT DETECTED NOT DETECTED Final   Candida albicans NOT DETECTED NOT DETECTED Final   Candida auris NOT DETECTED NOT DETECTED Final   Candida glabrata NOT DETECTED NOT DETECTED Final   Candida krusei NOT DETECTED NOT DETECTED Final   Candida parapsilosis NOT DETECTED NOT DETECTED Final   Candida tropicalis NOT DETECTED NOT DETECTED Final   Cryptococcus neoformans/gattii NOT DETECTED NOT DETECTED Final   Methicillin resistance mecA/C DETECTED (A) NOT DETECTED Final    Comment: CRITICAL RESULT CALLED TO, READ BACK BY AND VERIFIED WITH: BRANDON BEERS AT 1539 ON 01/20/21 BY SS Performed at [facility], [address] [place], [place] [address]   CULTURE, BLOOD (ROUTINE X 2) w Reflex to ID Panel     Status: None   Collection Time: 01/19/21  6:47 PM   Specimen: BLOOD  Result Value Ref Range Status   Specimen Description BLOOD BLOOD RIGHT HAND  Final   Special Requests   Final    BOTTLES DRAWN AEROBIC AND ANAEROBIC Blood Culture adequate volume   Culture   Final    NO GROWTH 5 DAYS Performed at [facility], [address] [place], [place] [address]    Report Status 01/24/2021 FINAL  Final  MRSA PCR Screening     Status: None   Collection Time: 01/20/21  7:58 AM   Specimen:  Nasal Mucosa; Nasopharyngeal  Result Value Ref Range Status   MRSA by PCR NEGATIVE NEGATIVE Final     Comment:        The GeneXpert MRSA Assay (FDA approved for NASAL specimens only), is one component of a comprehensive MRSA colonization surveillance program. It is not intended to diagnose MRSA infection nor to guide or monitor treatment for MRSA infections. Performed at [facility], [address] [place], [place] [address]   CULTURE, BLOOD (ROUTINE X 2) w Reflex to ID Panel     Status: None   Collection Time: 01/22/21  1:02 PM   Specimen: BLOOD  Result Value Ref Range Status   Specimen Description BLOOD BLOOD LEFT HAND  Final   Special Requests   Final    BOTTLES DRAWN AEROBIC AND ANAEROBIC Blood Culture adequate volume   Culture   Final    NO GROWTH 5 DAYS Performed at [facility], [address] [place], [place] [address]    Report Status 01/27/2021 FINAL  Final  CULTURE, BLOOD (ROUTINE X 2) w Reflex to ID Panel     Status: None   Collection Time: 01/22/21  1:02 PM   Specimen: BLOOD  Result Value Ref Range Status   Specimen Description BLOOD BLOOD RIGHT HAND  Final   Special Requests   Final    BOTTLES DRAWN AEROBIC AND ANAEROBIC Blood Culture adequate volume   Culture   Final    NO GROWTH 5 DAYS Performed at [facility] [address] [place], [place] [address]    Report Status 01/27/2021 FINAL  Final  Culture, respiratory (non-expectorated)     Status: None   Collection Time: 01/25/21 10:30 AM   Specimen: Tracheal Aspirate; Respiratory  Result Value Ref Range Status   Specimen Description   Final    TRACHEAL ASPIRATE Performed at [facility], [address] [place], [place] [address]    Special Requests   Final    NONE Performed at [facility], [address] [place], [place] [address]    Gram Stain   Final    MODERATE WBC PRESENT,BOTH PMN AND MONONUCLEAR NO ORGANISMS SEEN Performed at [facility], [place] [address], [place],  [address]    Culture FEW CANDIDA ALBICANS  Final   Report  Status 01/28/2021 FINAL  Final    Coagulation Studies: Recent Labs    01/30/2021 1327  LABPROT 16.9*  INR 1.4*    Urinalysis: No results for input(s): COLORURINE, LABSPEC, PHURINE, GLUCOSEU, HGBUR, BILIRUBINUR, KETONESUR, PROTEINUR, UROBILINOGEN, NITRITE, LEUKOCYTESUR in the last 72 hours.  Invalid input(s): APPERANCEUR    Imaging: PERIPHERAL VASCULAR CATHETERIZATION  Result Date: 01/12/2021 See op note  DG Chest Port 1 View  Result Date: 01/30/2021 CLINICAL DATA:  Acute respiratory failure with hypoxia EXAM: PORTABLE CHEST 1 VIEW COMPARISON:  Radiograph 01/28/2021 FINDINGS: *Endotracheal tube tip terminates in the mid trachea, 3.2 cm from the expected location of the carina. *A right IJ catheter tip terminates in the lower SVC. *Right upper extremity PICC tip terminates at the level of the right atrium. *Transesophageal tube tip and side port terminate below the margins of imaging, beyond the GE junction. * Telemetry leads and external support devices overlie the chest. Persistent heterogeneous bilateral airspace opacities, left greater than right. Some increasing coalescence is noted in the right mid lung and left basilar periphery. Stable cardiomediastinal contours with mild cardiomegaly and a calcified aorta. No visible pneumothorax or effusion the portions of the left  costophrenic sulcus are collimated. No acute osseous or soft tissue abnormality. IMPRESSION: 1. Persistent heterogeneous bilateral airspace opacities, left greater than right. Some increasing coalescence in the right mid lung and left basilar periphery. 2. Stable cardiomegaly. 3. Support devices, as above. Electronically Signed   By: Lovena Le M.D.   On: 01/30/2021 02:03     Medications:   . sodium chloride Stopped (02/06/2021 0528)  . dexmedetomidine (PRECEDEX) IV infusion 0.6 mcg/kg/hr (01/31/21 0802)  . famotidine (PEPCID) IV Stopped (01/30/21 2306)  . feeding supplement (NEPRO CARB STEADY) 1,000 mL (01/30/21  1528)  . fentaNYL infusion INTRAVENOUS 200 mcg/hr (01/31/21 0600)  . heparin 1,500 Units/hr (01/31/21 0600)  . norepinephrine (LEVOPHED) Adult infusion 4 mcg/min (01/31/21 0600)   . amiodarone  400 mg Per Tube BID   Followed by  . [START ON 02/08/2021] amiodarone  200 mg Per Tube Daily  . chlorhexidine gluconate (MEDLINE KIT)  15 mL Mouth Rinse BID  . Chlorhexidine Gluconate Cloth  6 each Topical Q0600  . diltiazem  30 mg Per Tube Q6H  . feeding supplement (PROSource TF)  45 mL Per Tube BID  . Gerhardt's butt cream   Topical QID  . insulin aspart  0-20 Units Subcutaneous Q4H  . insulin aspart  5 Units Subcutaneous Q4H  . insulin detemir  22 Units Subcutaneous Daily  . mouth rinse  15 mL Mouth Rinse 10 times per day  . metoprolol tartrate  100 mg Per Tube BID  . sodium chloride flush  10-40 mL Intracatheter Q12H  . sodium chloride flush  10-40 mL Intracatheter Q12H  . sodium chloride flush  3 mL Intravenous Q12H   sodium chloride, acetaminophen (TYLENOL) oral liquid 160 mg/5 mL, acetaminophen, albuterol, guaiFENesin-dextromethorphan, midazolam, ondansetron (ZOFRAN) IV, sodium chloride flush, sodium chloride flush, sodium chloride flush  Assessment/ Plan:  Dana Bruce is a 55 y.o. white female with hypertension, IBS, diabetes mellitus type II, who was admitted to [facility] on 01/12/2021 for Acute respiratory failure with hypoxia ([place]) [J96.01] Pneumonia due to COVID-19 virus [U07.1, J12.82] COVID-19 [U07.1]  1. Acute kidney injury:    AKI likely secondary to ATN  02/19 0701 - 02/20 0700 In: 3137.8 [I.V.:1707.7; NG/GT:1380; IV Piggyback:50] Out: 60 [Urine:60]  Lab Results  Component Value Date   CREATININE 5.90 (H) 01/31/2021   CREATININE 6.04 (H) 01/30/2021   CREATININE 4.64 (H) 01/17/2021     Declining urine output.   mild hyperkalemia - Hold home medication of telmisartan. HD # 1 on 01/31/2021 Will monitor; next HD probably tomorrow  #. 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   Lab Results  Component Value Date   HGBA1C 6.3 (H) 01/22/2021   hold metformin  #. Acute respiratory failure: requiring intubation and mechanical ventilation. COVID-19 infection, ARDS.  Off isolation now     LOS: [place] 2/20/20229:18 AM

## 2021-01-31 NOTE — Progress Notes (Signed)
NAME:  Dana Bruce, MRN:  [id number], DOB:  May 19, 1966, LOS: 11 ADMISSION DATE:  01/12/2021, INITIAL CONSULTATION DATE:  01/18/2021 REFERRING MD:  Dr. Jari Pigg,  CHIEF COMPLAINT: Shortness of breath  Brief History:  55 year old unvaccinated female admitted with acute hypoxic respiratory failure in the setting of COVID-19 pneumonia   Subjective Findings:  01/15/2021- patient with worsening resp distress s/p ETT.  Has been started on lasix. Due to BMI >62 will hold proning. Patient with resp failure s/p ETT - I have updated husband Dana Bruce via phone today. 01/16/2021- patient was awake and had attempted to self extubate, sedation was increased. Midline was placed due to inadequate access no need for central line at this time. AM labs with worsening GFR, hyperglycemia.  Vitals stable. UOP adequate appx 1L overnight, CXR with bilateral multifocal infiltrates. Ventilator management performed.  01/17/2021- patient has been weaned on MV from FIO2-100% to 70%, repeat ABG this afternoon.  Called husband today (POA Dana Bruce) reviewed care plan and answered questions.  01/19/2021-appears volume overloaded, still with increased work of breathing when sedation is lightened 2/9FAILURE TO WEAN FROM VENT, SEVERE ALI 2/10 severe resp failure, failure to wean from vent 2/11 failed weaning trials +hyperkalemia 2/12 severe resp failure, failed weaning trials, PICC line placed+hyperkalemia 2/13 severe resp failure 01/27/2021- Patient is worsening, renal function declining nephrology consultation today, she is febrile remains critically ill on 50% fiO2, on OG Feeds with nepro, has PICC line,  Has been on numerous antibiotics and ID is following.  Goals of care discussion today with family.  UOP is only 300 overnight. Family updated today via phone.  01/28/2021- heavy frothy pink sputum per ETT. Sending tracheal aspirate. Called POA husband was unable to reach him today. Reviewed and updated medications with  phramD 01/30/2021-dialysis started today 01/31/2021: Tolerated dialysis, no other issues overnight   Interval History:  Pt with worsening AKI (Creatine 5.90); plan is for short term HD started yesterday Dialysis catheter placed by vascular surgery 02/19 No events reported overnight Hemodynamically stable, not requiring pressors; Afebrile Palliative Care consulted for assistance with goals of care  Past Medical History:  Hypertension Diabetes mellitus Arthritis Goiter IBS    Consults:  PCCM Infectious Disease Nephrology Palliative Care  Procedures:  2/4: Endotracheal intubation 2/18: Temporary dialysis catheter placed by Vascular Surgery  Significant Diagnostic Tests:  2/3: Chest x-ray>>IMPRESSION: Diffuse bilateral ground-glass airspace opacities consistent with the patient's history of viral pneumonia. 2/8: Chest x-ray>> improved aeration from prior  Micro Data:  1/30: SARS-CoV-2 PCR>> positive 2/3: Blood culture x2>>no growth 2/3: Sputum>>normal respiratory flora 2/3: Strep pneumo urinary antigen>> 2/3: Legionella urinary antigen>> 2/8: Tracheal aspirate>>normal respiratory flora 2/8: Blood culture>>STAPHYLOCOCCUS EPIDERMIDIS (? Contaminant) 2/9: MRSA PCR>>negative 2/11: Blood cutlure>> no growth 2/14: Tracheal aspirate>>FEW CANDIDA ALBICANS, otherwise no growth  Antimicrobials:  Remdesivir 2/3>> 2/8 Levaquin 2/3>>2/8 Cefepime 2/6>>2/15  Objective   Blood pressure (!) 103/56, pulse (!) 126, temperature (!) 100.4 F (38 C), resp. rate 16, height 5' 7"  (1.702 m), weight (!) 189.1 kg, SpO2 94 %.    Vent Mode: PRVC FiO2 (%):  [60 %] 60 % Set Rate:  [15 bmp] 15 bmp Vt Set:  [500 mL] 500 mL PEEP:  [5 cmH20] 5 cmH20   Intake/Output Summary (Last 24 hours) at 01/31/2021 2018 Last data filed at 01/31/2021 1630 Gross per 24 hour  Intake 4118.54 ml  Output 40 ml  Net 4078.54 ml   Filed Weights   01/28/21 0334 01/18/2021 0406 01/30/21 0343  Weight: Marland Kitchen)  186.9 kg (!) 186.9 kg (!) 189.1 kg    Examination: General: Critically ill-appearing morbidly obese female, laying in bed, intubated and sedated, in no acute distress HENT: Atraumatic, normocephalic, neck supple, no JVD, scleral edema, ET tube in place Lungs: Coarse breath sounds bilaterally, synchronous with vent, even Cardiovascular: Tachycardia, irregular irregular rhythm, no murmurs, rubs, gallops Abdomen: Obese, soft, nontender, nondistended, no guarding or rebound tenderness Extremities: No deformities, anasarca 2+ Neuro: Currently sedated, withdraws from pain, pupils PERRLA GU: Foley catheter in place Skin: Warm and dry.  No obvious rashes, lesions, ulcerations    IMAGING   No imaging today   Assessment & Plan:   Acute Hypoxic Respiratory Failure in the setting of COVID-19 Pneumonia -Continue mechanical ventilation -Wean PEEP and FiO2 as able to maintain O2 sats >88% -Paralytics if necessary for vent synchrony, gas exchange -Sedation per PAD protocol, goal RASS -2, currently fentanyl & Precedex  -VAP prevention order set -Completed course of Remdesivir -Completed course of IV Steroids -Follow inflammatory markers: Ferritin, D-dimer, CRP -Vitamin C, zinc -Plan to repeat and check resp cultures>> Repeat tracheal aspirate on 01/25/21 with few candida albicans (ID following) -Unable to prone due to body habitus (BMI 62) -Airborne and contact precautions DC'd yesterday after 21-day mark from initial positive Covid test -Pt will likely need Trach for survival    Atrial Fibrillation w/ RVR -Continuous cardiac monitoring -Maintain MAP >65 -On amiodarone per tube to reduce amount of volume pt is receiving -Continue Metoprolol and Cardizem PO  -On heparin infusion due to worsening renal function   AKI -Monitor I&O's / urinary output -Follow BMP -Ensure adequate renal perfusion -Avoid nephrotoxic agents as able -Replace electrolytes as indicated -Nephrology following,  appreciate input   -Had dialysis yesterday, required pressor support during dialysis -Off pressors now   Anemia without s/sx of Bleeding -Monitor for S/Sx of bleeding -Trend CBC -Lovenox for VTE Prophylaxis  -Transfuse for Hgb <7   Diabetes mellitus with severe hyperglycemia -CBG's -SSI & Lantus -Follow ICU Hypo/Hyperglycemia protocol   Morbid obesity BMI 62 -This issue adds complexity to her management -Unable to prone due to body habitus -Will make weaning challenging -PEEP minimum of 8 due to body habitus to avoid derecruitment         Best practice (evaluated daily)  Diet: Tube feeds Pain/Anxiety/Delirium protocol (if indicated): Precedex/fentanyl VAP protocol (if indicated): Yes, active DVT prophylaxis: Lovenox subcu GI prophylaxis: Pepcid Glucose control: Sliding scale insulin Mobility: Bed rest Disposition: ICU  Goals of Care:   Code Status: DNR Family update:  Updated pt's husband via telephone 2/18.  We discussed her poor prognosis, with worsening AKI necessitating HD.  Also discussed likelihood she will need tracheostomy for survival.  He is unsure whether she would want to have trach and to have to reside in a facility. Palliative Care consulted for further assistance with goals of treatment.  Labs   CBC: Recent Labs  Lab 01/27/21 0530 01/28/21 0305 01/23/2021 0409 01/30/21 0351 01/31/21 0348  WBC 13.4* 15.7* 16.7* 13.6* 17.9*  NEUTROABS 8.2* 11.4* 11.8* 9.5* 13.2*  HGB 8.3* 8.6* 8.4* 7.3* 7.9*  HCT 28.5* 27.9* 27.8* 24.3* 25.2*  MCV 89.6 86.9 87.1 87.7 86.9  PLT 286 306 323 268 161    Basic Metabolic Panel: Recent Labs  Lab 01/27/21 0530 01/28/21 0305 01/27/2021 0409 01/30/21 0351 01/30/21 1108 01/31/21 0348 01/31/21 0350  NA 144 141 138 136  --   --  136  K 4.1 4.3 4.5 5.1  --   --  5.2*  CL 112* 109 109 105  --   --  105  CO2 21* 21* 20* 20*  --   --  20*  GLUCOSE 160* 210* 196* 194*  --   --  225*  BUN 108* 130* 136* 153*  --   --   130*  CREATININE 2.88* 3.60* 4.64* 6.04*  --   --  5.90*  CALCIUM 9.7 10.0 9.9 9.4  --   --  8.8*  MG 2.5* 2.6* 2.7* 2.7*  --  2.7*  --   PHOS 6.4* 6.9* 8.2* 10.2* 10.1*  --  9.5*   GFR: Estimated Creatinine Clearance: 19.2 mL/min (A) (by C-G formula based on SCr of 5.9 mg/dL (H)). Recent Labs  Lab 01/27/21 0530 01/28/21 0305 01/27/2021 0409 01/30/21 0351 01/31/21 0348  PROCALCITON 0.30  --   --   --  1.43  WBC 13.4* 15.7* 16.7* 13.6* 17.9*    Liver Function Tests: Recent Labs  Lab 01/27/21 0530 01/28/21 0305 02/03/2021 0409 01/30/21 0351 01/31/21 0350  ALBUMIN 2.1* 2.2* 2.4* 2.2* 2.0*   No results for input(s): LIPASE, AMYLASE in the last 168 hours. No results for input(s): AMMONIA in the last 168 hours.  ABG    Component Value Date/Time   PHART 7.33 (L) 01/18/2021 0428   PCO2ART 53 (H) 01/18/2021 0428   PO2ART 89 01/18/2021 0428   HCO3 27.9 01/18/2021 0428   ACIDBASEDEF 0.8 01/15/2021 1133   O2SAT 96.2 01/18/2021 0428     Coagulation Profile: Recent Labs  Lab 01/17/2021 1327  INR 1.4*    Cardiac Enzymes: No results for input(s): CKTOTAL, CKMB, CKMBINDEX, TROPONINI in the last 168 hours.  HbA1C: Hgb A1c MFr Bld  Date/Time Value Ref Range Status  01/26/2021 02:41 PM 6.3 (H) 4.8 - 5.6 % Final    Comment:    (NOTE) Pre diabetes:          5.7%-6.4%  Diabetes:              >6.4%  Glycemic control for   <7.0% adults with diabetes     CBG: Recent Labs  Lab 01/31/21 0325 01/31/21 0729 01/31/21 1103 01/31/21 1525 01/31/21 1932  GLUCAP 209* 200* 173* 197* 215*    Review of Systems:   Unable to assess due intubation, sedation, and critical illness   Allergies Allergies  Allergen Reactions  . Lasix [Furosemide] Itching  . Penicillins Hives and Itching    Has patient had a PCN reaction causing immediate rash, facial/tongue/throat swelling, SOB or lightheadedness with hypotension: yes Has patient had a PCN reaction causing severe rash involving  mucus membranes or skin necrosis: no Has patient had a PCN reaction that required hospitalization No Has patient had a PCN reaction occurring within the last 10 years: No If all of the above answers are "NO", then may proceed with Cephalosporin use.   [place] Flavor [Flavoring Agent] Itching, Nausea And Vomiting and Rash    Breaks out in places where she touches strawberries      Current medications  Scheduled Meds: . amiodarone  400 mg Per Tube BID   Followed by  . [START ON 02/08/2021] amiodarone  200 mg Per Tube Daily  . chlorhexidine gluconate (MEDLINE KIT)  15 mL Mouth Rinse BID  . Chlorhexidine Gluconate Cloth  6 each Topical Q0600  . diltiazem  30 mg Per Tube Q6H  . feeding supplement (PROSource TF)  45 mL Per Tube BID  . Gerhardt's butt cream   Topical QID  .  insulin aspart  0-20 Units Subcutaneous Q4H  . insulin aspart  5 Units Subcutaneous Q4H  . insulin detemir  22 Units Subcutaneous Daily  . mouth rinse  15 mL Mouth Rinse 10 times per day  . metoprolol tartrate  100 mg Per Tube BID  . sodium chloride flush  10-40 mL Intracatheter Q12H  . sodium chloride flush  10-40 mL Intracatheter Q12H  . sodium chloride flush  3 mL Intravenous Q12H   Continuous Infusions: . sodium chloride Stopped (01/27/2021 0528)  . dexmedetomidine (PRECEDEX) IV infusion 0.6 mcg/kg/hr (01/31/21 1809)  . famotidine (PEPCID) IV Stopped (01/30/21 2306)  . feeding supplement (NEPRO CARB STEADY) 1,000 mL (01/31/21 1024)  . fentaNYL infusion INTRAVENOUS 200 mcg/hr (01/31/21 1600)  . heparin 1,500 Units/hr (01/31/21 1810)  . norepinephrine (LEVOPHED) Adult infusion 14 mcg/min (01/31/21 1600)   PRN Meds:.sodium chloride, acetaminophen (TYLENOL) oral liquid 160 mg/5 mL, acetaminophen, albuterol, guaiFENesin-dextromethorphan, midazolam, ondansetron (ZOFRAN) IV, sodium chloride flush, sodium chloride flush, sodium chloride flush    Critical care time: 35 minutes    Coordination was performed with  the patient's bedside nurse.  The patient is critically ill with multiple organ systems failure and requires high complexity decision making for assessment and support, frequent evaluation and titration of therapies, application of advanced monitoring technologies and extensive interpretation of multiple databases. Critical Care Time devoted to patient care services described in this note is 35 minutes.    Renold Don, MD [facility]   *This note was dictated using voice recognition software/Dragon.  Despite best efforts to proofread, errors can occur which can change the meaning.  Any change was purely unintentional.

## 2021-01-31 NOTE — Progress Notes (Signed)
Day 22 post-positive covid test. Airborne and contact isolation discontinued.

## 2021-01-31 NOTE — Consult Note (Signed)
PHARMACY CONSULT NOTE  Pharmacy Consult for Electrolyte Monitoring and Replacement   Recent Labs: Potassium (mmol/L)  Date Value  01/31/2021 5.2 (H)   Magnesium (mg/dL)  Date Value  18/56/3149 2.7 (H)   Calcium (mg/dL)  Date Value  70/26/3785 8.8 (L)   Albumin (g/dL)  Date Value  88/50/2774 2.0 (L)   Phosphorus (mg/dL)  Date Value  12/87/8676 9.5 (H)   Sodium (mmol/L)  Date Value  01/31/2021 136   Assessment: 55 yo F with COVID-19 presenting with acute hypoxic respiratory failure in the setting of COVID-19 pneumonia. PMH includes HTN, diabetes, and IBS. Pt intubated 2/4.   Pharmacy consulted for electrolyte monitoring and replacement.  Nutrition: Tube feeds   Goal of Therapy:  - K >4 - Mag >2 - all other electrolytes WNL  Plan:  2/19 AM labs:  K 5.2  Mag 2.7 Phos 9.5  Scr 4.64>6.04>5.90 (worsening) - No electrolyte replacement needed today, nephrology considering dialysis - Will follow-up electrolytes with tomorrow's AM labs  Albina Billet, PharmD, BCPS Clinical Pharmacist 01/31/2021 9:19 AM

## 2021-01-31 NOTE — Consult Note (Signed)
ANTICOAGULATION CONSULT NOTE   Pharmacy Consult for IV heparin Indication: atrial fibrillation  Patient Measurements: Height: 5\' 7"  (170.2 cm) Weight: (!) 189.1 kg (417 lb) IBW/kg (Calculated) : 61.6  Labs: Recent Labs    01/28/21 0305 01/28/21 1058 02/07/2021 0409 01/18/2021 1327 01/31/2021 1327 01/30/21 0351 01/30/21 1700 01/31/21 0059  HGB 8.6*  --  8.4*  --   --  7.3*  --   --   HCT 27.9*  --  27.8*  --   --  24.3*  --   --   PLT 306  --  323  --   --  268  --   --   APTT  --   --   --  48*  --   --   --   --   LABPROT  --   --   --  16.9*  --   --   --   --   INR  --   --   --  1.4*  --   --   --   --   HEPARINUNFRC  --   --   --  0.92*   < > 0.55 0.41 0.38  HEPRLOWMOCWT  --  1.85  --   --   --   --   --   --   CREATININE 3.60*  --  4.64*  --   --  6.04*  --   --    < > = values in this interval not displayed.    Estimated Creatinine Clearance: 18.7 mL/min (A) (by C-G formula based on SCr of 6.04 mg/dL (H)).   Medical History: Past Medical History:  Diagnosis Date  . Arthritis    hips  . Diabetes mellitus without complication (HCC)    type 2  . Family history of adverse reaction to anesthesia    mother has ponv  . Goiter    followed by dr gerkin q year  . Hypertension   . IBS (irritable bowel syndrome)   . PONV (postoperative nausea and vomiting)    ponv 1 day after last dental surgery    Assessment: Pt is a 55 y.o. F presenting to the hospital due to acute respiratory failure with hypoxia secondary to COVID-19 pneumonia. PMH includes HTN, T2DM, and IBS. During sedation adjustments, pt developed A.fib RVR, with no PMH of A.fib on file. She was initiated on amiodarone bolus and drip.  Pt renal function worsening (1.93 >>2.88 >>3.60). Pt is also fluid overloaded. Given worsening renal function, low threshold to switch anticoagulation to IV heparin or apixaban. Concerns with IV heparin include worsening volume overload. Concerns with apixaban include lack of easy  reversal.   Discussed on rounds. Will switch LMWH to IV heparin. Nephrology planning to proceed with RRT.  Date LMWH level (4 hr) Comment  2/16 at 1017 0.84 Therapeutic  2/17 at 1058 1.85 Supratherapeutic  2/18 at 1327 0.92 Last dose 2/17 0600  2/19 at 0351 0.55 therapeutic  Heparin dosing weight 110.6kg (base don IBW 61.6kg and actual weight 189.1kg) HGB 8.4>7.3   Levels since starting on IV heparin 2/19 at 1700 HL 0.41, therapeutic  Goal of Therapy:  Heparin level 0.3-0.7 units/ml Monitor platelets by anticoagulation protocol: Yes   Plan:  2/20:  HL @ 0059 = 0.38, therapeutic X 2  Will continue pt on current rate and recheck HL on 2/21 with AM labs.   Rayme Bui D, PharmD 01/31/2021 1:45 AM

## 2021-02-01 ENCOUNTER — Inpatient Hospital Stay: Payer: Managed Care, Other (non HMO)

## 2021-02-01 ENCOUNTER — Encounter: Payer: Self-pay | Admitting: Internal Medicine

## 2021-02-01 DIAGNOSIS — U071 COVID-19: Secondary | ICD-10-CM | POA: Diagnosis not present

## 2021-02-01 DIAGNOSIS — I4891 Unspecified atrial fibrillation: Secondary | ICD-10-CM | POA: Diagnosis not present

## 2021-02-01 DIAGNOSIS — Z515 Encounter for palliative care: Secondary | ICD-10-CM | POA: Diagnosis not present

## 2021-02-01 DIAGNOSIS — Z7189 Other specified counseling: Secondary | ICD-10-CM | POA: Diagnosis not present

## 2021-02-01 DIAGNOSIS — R5081 Fever presenting with conditions classified elsewhere: Secondary | ICD-10-CM

## 2021-02-01 DIAGNOSIS — I9589 Other hypotension: Secondary | ICD-10-CM

## 2021-02-01 DIAGNOSIS — E669 Obesity, unspecified: Secondary | ICD-10-CM | POA: Diagnosis not present

## 2021-02-01 DIAGNOSIS — J9601 Acute respiratory failure with hypoxia: Secondary | ICD-10-CM | POA: Diagnosis not present

## 2021-02-01 LAB — GLUCOSE, CAPILLARY
Glucose-Capillary: 213 mg/dL — ABNORMAL HIGH (ref 70–99)
Glucose-Capillary: 244 mg/dL — ABNORMAL HIGH (ref 70–99)
Glucose-Capillary: 240 mg/dL — ABNORMAL HIGH (ref 70–99)
Glucose-Capillary: 232 mg/dL — ABNORMAL HIGH (ref 70–99)

## 2021-02-01 LAB — CBC WITH DIFFERENTIAL/PLATELET
Abs Immature Granulocytes: 0.69 10*3/uL — ABNORMAL HIGH (ref 0.00–0.07)
Basophils Absolute: 0.1 10*3/uL (ref 0.0–0.1)
Basophils Relative: 1 %
Eosinophils Absolute: 0.5 10*3/uL (ref 0.0–0.5)
Eosinophils Relative: 3 %
HCT: 23.5 % — ABNORMAL LOW (ref 36.0–46.0)
Hemoglobin: 7.3 g/dL — ABNORMAL LOW (ref 12.0–15.0)
Immature Granulocytes: 5 %
Lymphocytes Relative: 8 %
Lymphs Abs: 1.2 10*3/uL (ref 0.7–4.0)
MCH: 27 pg (ref 26.0–34.0)
MCHC: 31.1 g/dL (ref 30.0–36.0)
MCV: 87 fL (ref 80.0–100.0)
Monocytes Absolute: 1.2 10*3/uL — ABNORMAL HIGH (ref 0.1–1.0)
Monocytes Relative: 8 %
Neutro Abs: 11.7 10*3/uL — ABNORMAL HIGH (ref 1.7–7.7)
Neutrophils Relative %: 75 %
Platelets: 283 10*3/uL (ref 150–400)
RBC: 2.7 MIL/uL — ABNORMAL LOW (ref 3.87–5.11)
RDW: 15.7 % — ABNORMAL HIGH (ref 11.5–15.5)
WBC: 15.3 10*3/uL — ABNORMAL HIGH (ref 4.0–10.5)
nRBC: 1.6 % — ABNORMAL HIGH (ref 0.0–0.2)

## 2021-02-01 LAB — RENAL FUNCTION PANEL
Albumin: 2.1 g/dL — ABNORMAL LOW (ref 3.5–5.0)
Anion gap: 11 (ref 5–15)
BUN: 114 mg/dL — ABNORMAL HIGH (ref 6–20)
CO2: 23 mmol/L (ref 22–32)
Calcium: 8.8 mg/dL — ABNORMAL LOW (ref 8.9–10.3)
Chloride: 99 mmol/L (ref 98–111)
Creatinine, Ser: 6.14 mg/dL — ABNORMAL HIGH (ref 0.44–1.00)
GFR, Estimated: 8 mL/min — ABNORMAL LOW (ref >60)
Glucose, Bld: 246 mg/dL — ABNORMAL HIGH (ref 70–99)
Phosphorus: 8.8 mg/dL — ABNORMAL HIGH (ref 2.5–4.6)
Potassium: 5.2 mmol/L — ABNORMAL HIGH (ref 3.5–5.1)
Sodium: 133 mmol/L — ABNORMAL LOW (ref 135–145)

## 2021-02-01 LAB — LACTIC ACID, PLASMA: Lactic Acid, Venous: 0.6 mmol/L (ref 0.5–1.9)

## 2021-02-01 LAB — C-REACTIVE PROTEIN: CRP: 18 mg/dL — ABNORMAL HIGH (ref <1.0)

## 2021-02-01 LAB — MAGNESIUM: Magnesium: 2.7 mg/dL — ABNORMAL HIGH (ref 1.7–2.4)

## 2021-02-01 LAB — HEPARIN LEVEL (UNFRACTIONATED)
Heparin Unfractionated: 0.18 IU/mL — ABNORMAL LOW (ref 0.30–0.70)
Heparin Unfractionated: 0.26 IU/mL — ABNORMAL LOW (ref 0.30–0.70)

## 2021-02-01 LAB — D-DIMER, QUANTITATIVE: D-Dimer, Quant: 1.58 ug/mL-FEU — ABNORMAL HIGH (ref 0.00–0.50)

## 2021-02-01 LAB — FERRITIN: Ferritin: 140 ng/mL (ref 11–307)

## 2021-02-01 LAB — PROCALCITONIN: Procalcitonin: 2.71 ng/mL

## 2021-02-01 MED ORDER — VANCOMYCIN VARIABLE DOSE PER UNSTABLE RENAL FUNCTION (PHARMACIST DOSING): Status: DC

## 2021-02-01 MED ORDER — HEPARIN BOLUS VIA INFUSION
3300.0000 [IU] | Freq: Once | INTRAVENOUS | Status: AC
  Administered 2021-02-01: 3300 [IU] via INTRAVENOUS
  Filled 2021-02-01: qty 3300

## 2021-02-01 MED ORDER — MORPHINE 100MG IN NS 100ML (1MG/ML) PREMIX INFUSION
1.0000 mg/h | INTRAVENOUS | Status: DC
  Administered 2021-02-01: 1 mg/h via INTRAVENOUS
  Filled 2021-02-01: qty 100

## 2021-02-01 MED ORDER — MORPHINE BOLUS VIA INFUSION
1.0000 mg | INTRAVENOUS | Status: DC | PRN
  Filled 2021-02-01: qty 4

## 2021-02-01 MED ORDER — PHENYLEPHRINE CONCENTRATED 100MG/250ML (0.4 MG/ML) INFUSION SIMPLE
0.0000 ug/min | INTRAVENOUS | Status: DC
  Administered 2021-02-01: 150 ug/min via INTRAVENOUS
  Filled 2021-02-01: qty 250

## 2021-02-01 MED ORDER — VANCOMYCIN HCL 2000 MG/400ML IV SOLN
2000.0000 mg | Freq: Once | INTRAVENOUS | Status: AC
  Administered 2021-02-01: 2000 mg via INTRAVENOUS
  Filled 2021-02-01: qty 400

## 2021-02-01 MED ORDER — SODIUM CHLORIDE 0.9 % IV SOLN
1.0000 g | INTRAVENOUS | Status: DC
  Administered 2021-02-01: 1 g via INTRAVENOUS
  Filled 2021-02-01 (×2): qty 1

## 2021-02-01 MED ORDER — AMIODARONE IV BOLUS ONLY 150 MG/100ML
150.0000 mg | Freq: Once | INTRAVENOUS | Status: AC
  Administered 2021-02-01: 150 mg via INTRAVENOUS
  Filled 2021-02-01: qty 100

## 2021-02-01 MED ORDER — HALOPERIDOL LACTATE 5 MG/ML IJ SOLN: 0.5000 mg | INTRAMUSCULAR | Status: DC | PRN

## 2021-02-01 MED ORDER — GLYCOPYRROLATE 0.2 MG/ML IJ SOLN
0.2000 mg | INTRAMUSCULAR | Status: DC | PRN
Start: 2021-02-01 — End: 2021-02-02

## 2021-02-01 MED ORDER — PHENYLEPHRINE HCL-NACL 10-0.9 MG/250ML-% IV SOLN
0.0000 ug/min | INTRAVENOUS | Status: DC
  Administered 2021-02-01: 20 ug/min via INTRAVENOUS
  Filled 2021-02-01: qty 250

## 2021-02-01 MED ORDER — ARTIFICIAL TEARS OPHTHALMIC OINT
TOPICAL_OINTMENT | OPHTHALMIC | Status: DC | PRN
  Filled 2021-02-01: qty 3.5

## 2021-02-01 MED ORDER — AMIODARONE LOAD VIA INFUSION: 150.0000 mg | Freq: Once | INTRAVENOUS | Status: DC

## 2021-02-01 MED ORDER — LORAZEPAM 2 MG/ML IJ SOLN
1.0000 mg | INTRAMUSCULAR | Status: DC | PRN
  Filled 2021-02-01: qty 1

## 2021-02-09 NOTE — Consult Note (Signed)
ANTICOAGULATION CONSULT NOTE   Pharmacy Consult for IV heparin Indication: atrial fibrillation  Patient Measurements: Height: 5\' 7"  (170.2 cm) Weight: (!) 191.9 kg (423 lb) IBW/kg (Calculated) : 61.6  Labs: Recent Labs    01/30/21 0351 01/30/21 1700 01/31/21 0059 01/31/21 0348 01/31/21 0350 02/04/2021 0458 01/29/2021 1447  HGB 7.3*  --   --  7.9*  --  7.3*  --   HCT 24.3*  --   --  25.2*  --  23.5*  --   PLT 268  --   --  274  --  283  --   HEPARINUNFRC 0.55   < > 0.38  --   --  0.18* 0.26*  CREATININE 6.04*  --   --   --  5.90* 6.14*  --    < > = values in this interval not displayed.    Estimated Creatinine Clearance: 18.6 mL/min (A) (by C-G formula based on SCr of 6.14 mg/dL (H)).   Medical History: Past Medical History:  Diagnosis Date  . Arthritis    hips  . Diabetes mellitus without complication (HCC)    type 2  . Family history of adverse reaction to anesthesia    mother has ponv  . Goiter    followed by dr gerkin q year  . Hypertension   . IBS (irritable bowel syndrome)   . PONV (postoperative nausea and vomiting)    ponv 1 day after last dental surgery    Assessment: Pt is a 55 y.o. F presenting to the hospital due to acute respiratory failure with hypoxia secondary to COVID-19 pneumonia. PMH includes HTN, T2DM, and IBS. During sedation adjustments, pt developed A.fib RVR, with no PMH of A.fib on file. She was initiated on amiodarone bolus and drip.  Pt renal function worsening (1.93 >>2.88 >>3.60). Pt is also fluid overloaded. Given worsening renal function, low threshold to switch anticoagulation to IV heparin or apixaban. Concerns with IV heparin include worsening volume overload. Concerns with apixaban include lack of easy reversal.    Goal of Therapy:  Heparin level 0.3-0.7 units/ml Monitor platelets by anticoagulation protocol: Yes   Plan:   Anti-Xa level subtherapeutic:  increase infusion rate to 2150 units/hr.   recheck heparin level 8 hrs  after rate change.   53, PharmD 01/15/2021 3:56 PM

## 2021-02-09 NOTE — Progress Notes (Signed)
   02/05/2021 0745  Vitals  Temp (!) 101.48 F (38.6 C)   PRN tylenol given for fever

## 2021-02-09 NOTE — Progress Notes (Addendum)
Discussed isolation precautions with intensive care provider and multidisciplinary team during rounds. Consensus from the team was that reinstating isolation precautions per the algorithm is not appropriate for this patient as the clinical picture is not expected to improve and is not driven by active covid infection but rather post-covid complication.

## 2021-02-09 NOTE — Progress Notes (Signed)
Family plan for withdrawal this evening. Tube feedings held.

## 2021-02-09 NOTE — Progress Notes (Signed)
Pts husband Natahsa Marian at pts bedside along with additional family members, and at this time he would like to transition pt to comfort measures only.  Therefore, will place orders for compassionate extubation and comfort care only.  Sonda Rumble, AGNP  Pulmonary/Critical Care Pager [phone] (please enter 7 digits) PCCM Consult Pager [phone] (please enter 7 digits)

## 2021-02-09 NOTE — Progress Notes (Signed)
NAME:  Dana Bruce, MRN:  [id number], DOB:  Mar 08, 1966, LOS: 73 ADMISSION DATE:  02/05/2021, INITIAL CONSULTATION DATE:  01/26/2021 REFERRING MD:  Dr. Jari Pigg,  CHIEF COMPLAINT: Shortness of breath  Brief History:  55 year old unvaccinated female admitted with acute hypoxic respiratory failure in the setting of COVID-19 pneumonia   Subjective Findings:  56/03/2021- patient with worsening resp distress s/p ETT.  Has been started on lasix. Due to BMI >62 will hold proning. Patient with resp failure s/p ETT - I have updated husband [name] via phone today. 01/16/2021- patient was awake and had attempted to self extubate, sedation was increased. Midline was placed due to inadequate access no need for central line at this time. AM labs with worsening GFR, hyperglycemia.  Vitals stable. UOP adequate appx 1L overnight, CXR with bilateral multifocal infiltrates. Ventilator management performed.  01/17/2021- patient has been weaned on MV from FIO2-100% to 70%, repeat ABG this afternoon.  Called husband today (POA Mahdiya Mossberg) reviewed care plan and answered questions.  01/19/2021-appears volume overloaded, still with increased work of breathing when sedation is lightened 2/9FAILURE TO WEAN FROM VENT, SEVERE ALI 2/10 severe resp failure, failure to wean from vent 2/11 failed weaning trials +hyperkalemia 2/12 severe resp failure, failed weaning trials, PICC line placed+hyperkalemia 2/13 severe resp failure 01/27/2021- Patient is worsening, renal function declining nephrology consultation today, she is febrile remains critically ill on 50% fiO2, on OG Feeds with nepro, has PICC line,  Has been on numerous antibiotics and ID is following.  Goals of care discussion today with family.  UOP is only 300 overnight. Family updated today via phone.  01/28/2021- heavy frothy pink sputum per ETT. Sending tracheal aspirate. Called POA husband was unable to reach him today. Reviewed and updated medications with  phramD 01/30/2021-dialysis started today 01/31/2021: Tolerated dialysis, no other issues overnight   Interval History:  Pt with worsening AKI (Creatine 5.90); plan is for short term HD started yesterday Dialysis catheter placed by vascular surgery 02/19 No events reported overnight Hemodynamically stable, not requiring pressors; Afebrile Palliative Care consulted for assistance with goals of care  Past Medical History:  Hypertension Diabetes mellitus Arthritis Goiter IBS    Consults:  PCCM Infectious Disease Nephrology Palliative Care  Procedures:  2/4: Endotracheal intubation 2/18: Temporary dialysis catheter placed by Vascular Surgery  Significant Diagnostic Tests:  2/3: Chest x-ray>>IMPRESSION: Diffuse bilateral ground-glass airspace opacities consistent with the patient's history of viral pneumonia. 2/8: Chest x-ray>> improved aeration from prior  Micro Data:  1/30: SARS-CoV-2 PCR>> positive 2/3: Blood culture x2>>no growth 2/3: Sputum>>normal respiratory flora 2/3: Strep pneumo urinary antigen>> 2/3: Legionella urinary antigen>> 2/8: Tracheal aspirate>>normal respiratory flora 2/8: Blood culture>>STAPHYLOCOCCUS EPIDERMIDIS (? Contaminant) 2/9: MRSA PCR>>negative 2/11: Blood cutlure>> no growth 2/14: Tracheal aspirate>>FEW CANDIDA ALBICANS, otherwise no growth  Antimicrobials:  Remdesivir 2/3>> 2/8 Levaquin 2/3>>2/8 Cefepime 2/6>>2/15  Objective   Blood pressure (!) 92/47, pulse (!) 123, temperature (!) 101.48 F (38.6 C), resp. rate (!) 0, height $RemoveB'5\' 7"'jMtNQiSZ$  (1.702 m), weight (!) 191.9 kg, SpO2 100 %.    Vent Mode: PRVC FiO2 (%):  [60 %-70 %] 70 % Set Rate:  [15 bmp] 15 bmp Vt Set:  [500 mL] 500 mL PEEP:  [5 cmH20-8 cmH20] 8 cmH20 Plateau Pressure:  [16 cmH20-20 cmH20] 16 cmH20   Intake/Output Summary (Last 24 hours) at 2021-02-10 0809 Last data filed at Feb 10, 2021 0700 Gross per 24 hour  Intake 3304.01 ml  Output 18 ml  Net 3286.01 ml   [facility]  01/22/2021 0406 01/30/21 0343 02-20-21 0242  Weight: (!) 186.9 kg (!) 189.1 kg (!) 191.9 kg    Examination: General: Critically ill-appearing morbidly obese female, laying in bed, intubated and sedated, in no acute distress HENT: Atraumatic, normocephalic, neck supple, no JVD, scleral edema, ET tube in place Lungs: Coarse breath sounds bilaterally, synchronous with vent, even Cardiovascular: Tachycardia, irregular irregular rhythm, no murmurs, rubs, gallops Abdomen: Obese, soft, nontender, nondistended, no guarding or rebound tenderness Extremities: No deformities, anasarca 2+ Neuro: Currently sedated, withdraws from pain, pupils PERRLA GU: Foley catheter in place Skin: Warm and dry.  No obvious rashes, lesions, ulcerations    IMAGING   No imaging today   Assessment & Plan:   Acute Hypoxic Respiratory Failure in the setting of COVID-19 Pneumonia -Completed course of Remdesivir -Completed course of IV Steroids -Continue mechanical ventilation, LVPS 6cckg target or deltaP < 15 -Wean PEEP to FiO2 table for oxygenation -Obesity may reflect use of higher PEEP  -Sedation per PAD protocol, goal RASS ZERO -VAP prevention order set -Follow inflammatory markers:CRP remains low -Vitamin C, zinc -Unable to prone due to body habitus (BMI 62) -Pt will likely need Trach for survival    Atrial Fibrillation w/ RVR -In flutter at this time with variable rate 120-140 -Replace levophed with neo -Continuous cardiac monitoring -Maintain MAP 60-65 -On amiodarone per tube  -Will reload Amiodarone $RemoveBeforeDEI'150mg'HjzJnqxbEMyBRqOP$  IV for large volume of distribution, continue PO -Continue Metoprolol and Cardizem PO  -On heparin infusion due to worsening renal function excluding LMWH   AKI Lab Results  Component Value Date   CREATININE 6.14 (H) 20-Feb-2021   BUN 114 (H) 02-20-2021   NA 133 (L) 2021-02-20   K 5.2 (H) 2021-02-20   CL 99 Feb 20, 2021   CO2 23 02-20-2021    Intake/Output Summary (Last 24  hours) at 20-Feb-2021 1093 Last data filed at Feb 20, 2021 0700 Gross per 24 hour  Intake 3304.01 ml  Output 18 ml  Net 3286.01 ml   Net IO Since Admission: 22,221.93 mL [20-Feb-2021 0829]  -Monitor I&O's / urinary output -Trend renal indices -Ensure adequate renal perfusion -Avoid nephrotoxic agents as able -Replace electrolytes as indicated -Nephrology following, appreciate input   -Had dialysis yesterday, required pressor support during dialysis -Use pressors and 25% albumin to work toward volume reduction    Anemia without s/sx of Bleeding -Monitor for S/Sx of bleeding -Trend CBC -now on heparin for VTE Prophylaxis  -Transfuse for Hgb <7  ID -Febrile -PCT 2.71, CRP 17.1, WBC 15.3 -CXR unchanged diffuse and bilateral infiltrates  -Reculture -On Cefepime began last night, add Vanc now -Consider drug source of fever as well if not responding to above  Diabetes mellitus with severe hyperglycemia -CBG's -SSI & Lantus -Follow ICU Hypo/Hyperglycemia protocol   Morbid obesity BMI 62 -This issue adds complexity to her management -Unable to prone due to body habitus -Will make weaning challenging -PEEP minimum of 10 due to body habitus to avoid derecruitment         Best practice (evaluated daily)  Diet: Tube feeds Pain/Anxiety/Delirium protocol (if indicated): Precedex/fentanyl VAP protocol (if indicated): Yes, active DVT prophylaxis: Lovenox subcu GI prophylaxis: Pepcid Glucose control: Sliding scale insulin Mobility: Bed rest Disposition: ICU  Goals of Care:  01/28/2021 Code Status: DNR Family update:  Updated pt's husband via telephone 2/18.  We discussed her poor prognosis, with worsening AKI necessitating HD.  Also discussed likelihood she will need tracheostomy for survival.  He is unsure whether she would want to have trach and  to have to reside in a facility. Palliative Care consulted for further assistance with goals of treatment.  Labs   CBC: Recent Labs  Lab  01/28/21 0305 01/23/2021 0409 01/30/21 0351 01/31/21 0348 2021/02/14 0458  WBC 15.7* 16.7* 13.6* 17.9* 15.3*  NEUTROABS 11.4* 11.8* 9.5* 13.2* 11.7*  HGB 8.6* 8.4* 7.3* 7.9* 7.3*  HCT 27.9* 27.8* 24.3* 25.2* 23.5*  MCV 86.9 87.1 87.7 86.9 87.0  PLT 306 323 268 274 161    Basic Metabolic Panel: Recent Labs  Lab 01/28/21 0305 01/17/2021 0409 01/30/21 0351 01/30/21 1108 01/31/21 0348 01/31/21 0350 February 14, 2021 0458  NA 141 138 136  --   --  136 133*  K 4.3 4.5 5.1  --   --  5.2* 5.2*  CL 109 109 105  --   --  105 99  CO2 21* 20* 20*  --   --  20* 23  GLUCOSE 210* 196* 194*  --   --  225* 246*  BUN 130* 136* 153*  --   --  130* 114*  CREATININE 3.60* 4.64* 6.04*  --   --  5.90* 6.14*  CALCIUM 10.0 9.9 9.4  --   --  8.8* 8.8*  MG 2.6* 2.7* 2.7*  --  2.7*  --  2.7*  PHOS 6.9* 8.2* 10.2* 10.1*  --  9.5* 8.8*   GFR: Estimated Creatinine Clearance: 18.6 mL/min (A) (by C-G formula based on SCr of 6.14 mg/dL (H)). Recent Labs  Lab 01/27/21 0530 01/28/21 0305 01/22/2021 0409 01/30/21 0351 01/31/21 0348 02-14-2021 0458  PROCALCITON 0.30  --   --   --  1.43 2.71  WBC 13.4*   < > 16.7* 13.6* 17.9* 15.3*   < > = values in this interval not displayed.    Liver Function Tests: Recent Labs  Lab 01/28/21 0305 01/28/2021 0409 01/30/21 0351 01/31/21 0350 14-Feb-2021 0458  ALBUMIN 2.2* 2.4* 2.2* 2.0* 2.1*   No results for input(s): LIPASE, AMYLASE in the last 168 hours. No results for input(s): AMMONIA in the last 168 hours.  Coagulation Profile: Recent Labs  Lab 01/27/2021 1327  INR 1.4*    Cardiac Enzymes: No results for input(s): CKTOTAL, CKMB, CKMBINDEX, TROPONINI in the last 168 hours.  HbA1C: Hgb A1c MFr Bld  Date/Time Value Ref Range Status  01/22/2021 02:41 PM 6.3 (H) 4.8 - 5.6 % Final    Comment:    (NOTE) Pre diabetes:          5.7%-6.4%  Diabetes:              >6.4%  Glycemic control for   <7.0% adults with diabetes     CBG: Recent Labs  Lab 01/31/21 1525  01/31/21 1932 01/31/21 2315 2021-02-14 0302 02/14/2021 0721  GLUCAP 197* 215* 222* 213* 244*    Allergies Allergies  Allergen Reactions  . Lasix [Furosemide] Itching  . Penicillins Hives and Itching    Has patient had a PCN reaction causing immediate rash, facial/tongue/throat swelling, SOB or lightheadedness with hypotension: yes Has patient had a PCN reaction causing severe rash involving mucus membranes or skin necrosis: no Has patient had a PCN reaction that required hospitalization No Has patient had a PCN reaction occurring within the last 10 years: No If all of the above answers are "NO", then may proceed with Cephalosporin use.   [place] Flavor [Flavoring Agent] Itching, Nausea And Vomiting and Rash    Breaks out in places where she touches strawberries      Current  medications  Scheduled Meds: . amiodarone  400 mg Per Tube BID   Followed by  . [START ON 02/08/2021] amiodarone  200 mg Per Tube Daily  . chlorhexidine gluconate (MEDLINE KIT)  15 mL Mouth Rinse BID  . Chlorhexidine Gluconate Cloth  6 each Topical Q0600  . diltiazem  30 mg Per Tube Q6H  . feeding supplement (PROSource TF)  45 mL Per Tube BID  . Gerhardt's butt cream   Topical QID  . insulin aspart  0-20 Units Subcutaneous Q4H  . insulin aspart  5 Units Subcutaneous Q4H  . insulin detemir  22 Units Subcutaneous Daily  . mouth rinse  15 mL Mouth Rinse 10 times per day  . metoprolol tartrate  50 mg Per Tube BID  . sodium chloride flush  10-40 mL Intracatheter Q12H  . sodium chloride flush  10-40 mL Intracatheter Q12H  . sodium chloride flush  3 mL Intravenous Q12H   Continuous Infusions: . sodium chloride Stopped (02/08/2021 0528)  . ceFEPime (MAXIPIME) IV    . dexmedetomidine (PRECEDEX) IV infusion 0.6 mcg/kg/hr (02/17/21 0700)  . famotidine (PEPCID) IV Stopped (01/31/21 2143)  . feeding supplement (NEPRO CARB STEADY) 1,000 mL (17-Feb-2021 0523)  . fentaNYL infusion INTRAVENOUS 250 mcg/hr (February 17, 2021 0700)   . heparin 1,900 Units/hr (2021-02-17 0806)  . norepinephrine (LEVOPHED) Adult infusion 8 mcg/min (February 17, 2021 0700)   PRN Meds:.sodium chloride, acetaminophen (TYLENOL) oral liquid 160 mg/5 mL, acetaminophen, albuterol, glycopyrrolate, guaiFENesin-dextromethorphan, midazolam, ondansetron (ZOFRAN) IV, polyvinyl alcohol, sodium chloride flush, sodium chloride flush, sodium chloride flush     Critical Care Time devoted to patient care services described in this note is 50 minutes.  Overall, patient is critically ill, prognosis is guarded, acute hypoxic respiratory failure/COVID Patient with Multiorgan failure and at high risk for cardiac arrest and death.  Images reviewed directly and interpretation in A&P is my own unless noted Labs reviewed and evaluated as noted in A&P

## 2021-02-09 NOTE — Significant Event (Addendum)
Morphine drip was started and pressors were discontinued. Sedation turn off and Pt was extubated on February 16, 2021 2099-04-05, and passed away at 05-Apr-2128, family was at bedside, pt belongings were sent home.

## 2021-02-09 NOTE — Progress Notes (Signed)
   01/18/2021 1830  Clinical Encounter Type  Visited With Family;Health care provider  Visit Type Spiritual support  Referral From Chaplain  Consult/Referral To Chaplain   This chaplain received a referral from Jeralene Peters to provide support. Upon arrival, the patient's loved ones were standing at the bedside. They were understandably tearful, but they also shared that they are a family of strong Christian faith. They are utilizing their faith to understand and navigate this very difficult time. They shared that they were awaiting the arrival of the patient's son and other family members. Chaplains Azilee Pirro and Black provided supportive care to the patient's family in the form of active and reflective listening and compassionate ministerial presence. Prayer was also offered for the patient and her  family along this difficult journey. The family expressed appreciation for the support provided.  Clovis Riley, Chaplain

## 2021-02-09 NOTE — Consult Note (Signed)
ANTICOAGULATION CONSULT NOTE   Pharmacy Consult for IV heparin Indication: atrial fibrillation  Patient Measurements: Height: 5\' 7"  (170.2 cm) Weight: (!) 191.9 kg (423 lb) IBW/kg (Calculated) : 61.6  Labs: Recent Labs    2021-02-24 1327 02-24-2021 1327 01/30/21 0351 01/30/21 1700 01/31/21 0059 01/31/21 0348 01/31/21 0350 01/31/2021 0458  HGB  --    < > 7.3*  --   --  7.9*  --  7.3*  HCT  --   --  24.3*  --   --  25.2*  --  23.5*  PLT  --   --  268  --   --  274  --  283  APTT 48*  --   --   --   --   --   --   --   LABPROT 16.9*  --   --   --   --   --   --   --   INR 1.4*  --   --   --   --   --   --   --   HEPARINUNFRC 0.92*  --  0.55 0.41 0.38  --   --  0.18*  CREATININE  --   --  6.04*  --   --   --  5.90* 6.14*   < > = values in this interval not displayed.    Estimated Creatinine Clearance: 18.6 mL/min (A) (by C-G formula based on SCr of 6.14 mg/dL (H)).   Medical History: Past Medical History:  Diagnosis Date  . Arthritis    hips  . Diabetes mellitus without complication (HCC)    type 2  . Family history of adverse reaction to anesthesia    mother has ponv  . Goiter    followed by dr gerkin q year  . Hypertension   . IBS (irritable bowel syndrome)   . PONV (postoperative nausea and vomiting)    ponv 1 day after last dental surgery    Assessment: Pt is a 55 y.o. F presenting to the hospital due to acute respiratory failure with hypoxia secondary to COVID-19 pneumonia. PMH includes HTN, T2DM, and IBS. During sedation adjustments, pt developed A.fib RVR, with no PMH of A.fib on file. She was initiated on amiodarone bolus and drip.  Pt renal function worsening (1.93 >>2.88 >>3.60). Pt is also fluid overloaded. Given worsening renal function, low threshold to switch anticoagulation to IV heparin or apixaban. Concerns with IV heparin include worsening volume overload. Concerns with apixaban include lack of easy reversal.   Discussed on rounds. Will switch LMWH to  IV heparin. Nephrology planning to proceed with RRT.  Date LMWH level (4 hr) Comment  2/16 at 1017 0.84 Therapeutic  2/17 at 1058 1.85 Supratherapeutic  2/18 at 1327 0.92 Last dose 2/17 0600  2/19 at 0351 0.55 therapeutic  Heparin dosing weight 110.6kg (base don IBW 61.6kg and actual weight 189.1kg) HGB 8.4>7.3   Levels since starting on IV heparin 2/19 at 1700 HL 0.41, therapeutic  Goal of Therapy:  Heparin level 0.3-0.7 units/ml Monitor platelets by anticoagulation protocol: Yes   Plan:  2/21:  HL @ 0458 = 0.18 Will order Heparin 3300 units IV X 1 bolus and increase drip rate to 1900 units/hr.  Will recheck HL 8 hrs after rate change.   Sakeena Teall D, PharmD 01/21/2021 6:04 AM

## 2021-02-09 NOTE — Progress Notes (Signed)
Staff is requesting support for family as care is withdrawn this evening at approx. 6:00 PM.

## 2021-02-09 NOTE — Progress Notes (Signed)
Results for Dana Bruce, PERSON (MRN [id number]) as of 01/13/2021 13:51  Ref. Range 01/31/2021 23:15 01/17/2021 03:02 02/02/2021 07:21 01/27/2021 11:10  Glucose-Capillary Latest Ref Range: 70 - 99 mg/dL 209 (H) 470 (H) 962 (H) 240 (H)  Noted that blood sugars continue to be greater than 200 mg/dl.   Recommend increasing Novolog tube feed coverage to 6 units every 4 hours if blood sugars continue to be elevated.   Smith Mince RN BSN CDE Diabetes Coordinator Pager: [phone]  8am-5pm

## 2021-02-09 NOTE — Progress Notes (Signed)
[facility]  ROUNDING NOTE   Subjective:   gen- remains critically ill Neuro- sedated with precedex, fentanyl Cv: now on norepinephrine Renal - poor UOP Pulm: vent assisted Fio2 70 %  Objective:  Vital signs in last 24 hours:  Temp:  [99 F (37.2 C)-102.56 F (39.2 C)] 100.94 F (38.3 C) (02/21 0900) Pulse Rate:  [89-164] 121 (02/21 0900) Resp:  [0-32] 18 (02/21 0900) BP: (70-124)/(33-72) 105/57 (02/21 0900) SpO2:  [85 %-100 %] 99 % (02/21 0900) FiO2 (%):  [60 %-70 %] 70 % (02/21 0725) Weight:  [191.9 kg] 191.9 kg (02/21 0242)  Weight change:  Filed Weights   01/13/2021 0406 01/30/21 0343 02-23-2021 0242  Weight: (!) 186.9 kg (!) 189.1 kg (!) 191.9 kg    Intake/Output: I/O last 3 completed shifts: In: 6034.6 [I.V.:2704.6; NG/GT:3230; IV Piggyback:100] Out: 69 [Urine:58]   Intake/Output this shift:  Total I/O In: 144.5 [I.V.:144.5] Out: -   Physical Exam: General: Critically ill, morbidly obese  Head: ETT, OGT  Eyes: Conjunctival hemorrhage  Lungs:   vent assited, coarse  Heart: Tachycardia, irregular  Abdomen:  Soft, nontender, obese  Extremities:  + dependent peripheral edema.  Neurologic:   sedated  Skin: No lesions  Access: Rt IJ temp cath - 2/18 - Dr Delana Meyer    Basic Metabolic Panel: Recent Labs  Lab 01/28/21 0305 01/12/2021 0409 01/30/21 0351 01/30/21 1108 01/31/21 0348 01/31/21 0350 2021-02-23 0458  NA 141 138 136  --   --  136 133*  K 4.3 4.5 5.1  --   --  5.2* 5.2*  CL 109 109 105  --   --  105 99  CO2 21* 20* 20*  --   --  20* 23  GLUCOSE 210* 196* 194*  --   --  225* 246*  BUN 130* 136* 153*  --   --  130* 114*  CREATININE 3.60* 4.64* 6.04*  --   --  5.90* 6.14*  CALCIUM 10.0 9.9 9.4  --   --  8.8* 8.8*  MG 2.6* 2.7* 2.7*  --  2.7*  --  2.7*  PHOS 6.9* 8.2* 10.2* 10.1*  --  9.5* 8.8*    Liver Function Tests: Recent Labs  Lab 01/28/21 0305 01/17/2021 0409 01/30/21 0351 01/31/21 0350 2021/02/23 0458  ALBUMIN 2.2* 2.4* 2.2*  2.0* 2.1*   No results for input(s): LIPASE, AMYLASE in the last 168 hours. No results for input(s): AMMONIA in the last 168 hours.  CBC: Recent Labs  Lab 01/28/21 0305 02/05/2021 0409 01/30/21 0351 01/31/21 0348 February 23, 2021 0458  WBC 15.7* 16.7* 13.6* 17.9* 15.3*  NEUTROABS 11.4* 11.8* 9.5* 13.2* 11.7*  HGB 8.6* 8.4* 7.3* 7.9* 7.3*  HCT 27.9* 27.8* 24.3* 25.2* 23.5*  MCV 86.9 87.1 87.7 86.9 87.0  PLT 306 323 268 274 283    Cardiac Enzymes: No results for input(s): CKTOTAL, CKMB, CKMBINDEX, TROPONINI in the last 168 hours.  BNP: Invalid input(s): POCBNP  CBG: Recent Labs  Lab 01/31/21 1525 01/31/21 1932 01/31/21 2315 02-23-2021 0302 February 23, 2021 0721  GLUCAP 197* 215* 78* 213* 2*    Microbiology: Results for orders placed or performed during the hospital encounter of 02/07/2021  Blood Culture (routine x 2)     Status: None   Collection Time: 01/17/2021  2:56 PM   Specimen: BLOOD  Result Value Ref Range Status   Specimen Description BLOOD BLOOD LEFT ARM  Final   Special Requests   Final    BOTTLES DRAWN AEROBIC AND ANAEROBIC Blood  Culture results may not be optimal due to an inadequate volume of blood received in culture bottles   Culture   Final    NO GROWTH 5 DAYS Performed at [facility], [address] [place], [place] [address]    Report Status 01/19/2021 FINAL  Final  Blood Culture (routine x 2)     Status: None   Collection Time: 01/19/2021  2:59 PM   Specimen: BLOOD  Result Value Ref Range Status   Specimen Description BLOOD BLOOD LEFT FOREARM  Final   Special Requests   Final    BOTTLES DRAWN AEROBIC AND ANAEROBIC Blood Culture adequate volume   Culture   Final    NO GROWTH 5 DAYS Performed at [facility], [address] [place], [place] [address]    Report Status 01/19/2021 FINAL  Final  Culture, respiratory     Status: None   Collection Time: 01/15/21  5:50 PM   Specimen: Tracheal Aspirate; Respiratory  Result Value Ref Range  Status   Specimen Description   Final    TRACHEAL ASPIRATE Performed at [facility], [address] [place], [place] [address]    Special Requests   Final    NONE Performed at [facility], [address] [place], [place] [address]    Gram Stain   Final    FEW WBC PRESENT, PREDOMINANTLY PMN FEW GRAM NEGATIVE RODS FEW GRAM POSITIVE RODS RARE GRAM POSITIVE COCCI    Culture   Final    Normal respiratory flora-no Staph aureus or Pseudomonas seen Performed at [facility], [place] [address], [place], [place] [address]    Report Status 01/18/2021 FINAL  Final  Culture, respiratory (non-expectorated)     Status: None   Collection Time: 01/19/21  6:20 PM   Specimen: Tracheal Aspirate; Respiratory  Result Value Ref Range Status   Specimen Description   Final    TRACHEAL ASPIRATE Performed at [facility], [address], [place], [place] [address]    Special Requests   Final    NONE Performed at [facility], [address] [place], [place] [address]    Gram Stain   Final    ABUNDANT WBC PRESENT, PREDOMINANTLY PMN FEW GRAM POSITIVE COCCI RARE GRAM NEGATIVE RODS RARE GRAM POSITIVE RODS    Culture   Final    Normal respiratory flora-no Staph aureus or Pseudomonas seen Performed at [facility], [place] [address] [place], [place] [address]    Report Status 01/22/2021 FINAL  Final  CULTURE, BLOOD (ROUTINE X 2) w Reflex to ID Panel     Status: Abnormal   Collection Time: 01/19/21  6:45 PM   Specimen: BLOOD  Result Value Ref Range Status   Specimen Description   Final    BLOOD BLOOD LEFT HAND Performed at [facility], [address], [place], [place] [address]    Special Requests   Final    BOTTLES DRAWN AEROBIC AND ANAEROBIC Blood Culture adequate volume Performed at [facility], [address] [place], [place] [address]    Culture  Setup Time   Final    GRAM POSITIVE COCCI IN BOTH AEROBIC AND  ANAEROBIC BOTTLES CRITICAL RESULT CALLED TO, READ BACK BY AND VERIFIED WITH: BRANDON BEERS AT 1539 ON 01/20/21 BY SS    Culture (A)  Final    STAPHYLOCOCCUS EPIDERMIDIS THE SIGNIFICANCE OF ISOLATING THIS ORGANISM FROM A SINGLE SET OF BLOOD CULTURES WHEN MULTIPLE SETS ARE DRAWN  IS UNCERTAIN. PLEASE NOTIFY THE MICROBIOLOGY DEPARTMENT WITHIN ONE WEEK IF SPECIATION AND SENSITIVITIES ARE REQUIRED. Performed at [facility], [place] [address] [place], [place] [address]    Report Status 01/23/2021 FINAL  Final  Blood Culture ID Panel (Reflexed)     Status: Abnormal   Collection Time: 01/19/21  6:45 PM  Result Value Ref Range Status   Enterococcus faecalis NOT DETECTED NOT DETECTED Final   Enterococcus Faecium NOT DETECTED NOT DETECTED Final   Listeria monocytogenes NOT DETECTED NOT DETECTED Final   Staphylococcus species DETECTED (A) NOT DETECTED Final    Comment: CRITICAL RESULT CALLED TO, READ BACK BY AND VERIFIED WITH: BRANDON BEERS AT 1539 ON 01/20/21 BY SS    Staphylococcus aureus (BCID) NOT DETECTED NOT DETECTED Final   Staphylococcus epidermidis DETECTED (A) NOT DETECTED Final    Comment: Methicillin (oxacillin) resistant coagulase negative staphylococcus. Possible blood culture contaminant (unless isolated from more than one blood culture draw or clinical case suggests pathogenicity). No antibiotic treatment is indicated for blood  culture contaminants. CRITICAL RESULT CALLED TO, READ BACK BY AND VERIFIED WITH: BRANDON BEERS AT 2353 ON 01/20/21 BY SS    Staphylococcus lugdunensis NOT DETECTED NOT DETECTED Final   Streptococcus species NOT DETECTED NOT DETECTED Final   Streptococcus agalactiae NOT DETECTED NOT DETECTED Final   Streptococcus pneumoniae NOT DETECTED NOT DETECTED Final   Streptococcus pyogenes NOT DETECTED NOT DETECTED Final   A.calcoaceticus-baumannii NOT DETECTED NOT DETECTED Final   Bacteroides fragilis NOT DETECTED NOT DETECTED Final   Enterobacterales NOT DETECTED  NOT DETECTED Final   Enterobacter cloacae complex NOT DETECTED NOT DETECTED Final   Escherichia coli NOT DETECTED NOT DETECTED Final   Klebsiella aerogenes NOT DETECTED NOT DETECTED Final   Klebsiella oxytoca NOT DETECTED NOT DETECTED Final   Klebsiella pneumoniae NOT DETECTED NOT DETECTED Final   Proteus species NOT DETECTED NOT DETECTED Final   Salmonella species NOT DETECTED NOT DETECTED Final   Serratia marcescens NOT DETECTED NOT DETECTED Final   Haemophilus influenzae NOT DETECTED NOT DETECTED Final   Neisseria meningitidis NOT DETECTED NOT DETECTED Final   Pseudomonas aeruginosa NOT DETECTED NOT DETECTED Final   Stenotrophomonas maltophilia NOT DETECTED NOT DETECTED Final   Candida albicans NOT DETECTED NOT DETECTED Final   Candida auris NOT DETECTED NOT DETECTED Final   Candida glabrata NOT DETECTED NOT DETECTED Final   Candida krusei NOT DETECTED NOT DETECTED Final   Candida parapsilosis NOT DETECTED NOT DETECTED Final   Candida tropicalis NOT DETECTED NOT DETECTED Final   Cryptococcus neoformans/gattii NOT DETECTED NOT DETECTED Final   Methicillin resistance mecA/C DETECTED (A) NOT DETECTED Final    Comment: CRITICAL RESULT CALLED TO, READ BACK BY AND VERIFIED WITH: BRANDON BEERS AT 1539 ON 01/20/21 BY SS Performed at [facility], [address] [place], [place] [address]   CULTURE, BLOOD (ROUTINE X 2) w Reflex to ID Panel     Status: None   Collection Time: 01/19/21  6:47 PM   Specimen: BLOOD  Result Value Ref Range Status   Specimen Description BLOOD BLOOD RIGHT HAND  Final   Special Requests   Final    BOTTLES DRAWN AEROBIC AND ANAEROBIC Blood Culture adequate volume   Culture   Final    NO GROWTH 5 DAYS Performed at [facility], [address] [place], [place] [address]    Report Status 01/24/2021 FINAL  Final  MRSA PCR Screening     Status: None   Collection Time: 01/20/21  7:58 AM   Specimen: Nasal Mucosa; Nasopharyngeal  Result Value Ref  Range Status   MRSA by PCR NEGATIVE NEGATIVE Final    Comment:        The GeneXpert MRSA Assay (FDA approved for NASAL specimens only), is one component of a comprehensive MRSA colonization surveillance program. It is not intended to diagnose MRSA infection nor to guide or monitor treatment for MRSA infections. Performed at [facility], [address] [place], [place] [address]   CULTURE, BLOOD (ROUTINE X 2) w Reflex to ID Panel     Status: None   Collection Time: 01/22/21  1:02 PM   Specimen: BLOOD  Result Value Ref Range Status   Specimen Description BLOOD BLOOD LEFT HAND  Final   Special Requests   Final    BOTTLES DRAWN AEROBIC AND ANAEROBIC Blood Culture adequate volume   Culture   Final    NO GROWTH 5 DAYS Performed at [facility], [address] [place], [place] [address]    Report Status 01/27/2021 FINAL  Final  CULTURE, BLOOD (ROUTINE X 2) w Reflex to ID Panel     Status: None   Collection Time: 01/22/21  1:02 PM   Specimen: BLOOD  Result Value Ref Range Status   Specimen Description BLOOD BLOOD RIGHT HAND  Final   Special Requests   Final    BOTTLES DRAWN AEROBIC AND ANAEROBIC Blood Culture adequate volume   Culture   Final    NO GROWTH 5 DAYS Performed at [facility], [address] [place], [place] [address]    Report Status 01/27/2021 FINAL  Final  Culture, respiratory (non-expectorated)     Status: None   Collection Time: 01/25/21 10:30 AM   Specimen: Tracheal Aspirate; Respiratory  Result Value Ref Range Status   Specimen Description   Final    TRACHEAL ASPIRATE Performed at [facility], [address] [place], [place] [address]    Special Requests   Final    NONE Performed at [facility], [address] [place], [place] [address]    Gram Stain   Final    MODERATE WBC PRESENT,BOTH PMN AND MONONUCLEAR NO ORGANISMS SEEN Performed at [facility], [place] [address], [place], [place]  [address]    Culture FEW CANDIDA ALBICANS  Final   Report Status 01/28/2021 FINAL  Final    Coagulation Studies: Recent Labs    02/07/2021 1327  LABPROT 16.9*  INR 1.4*    Urinalysis: No results for input(s): COLORURINE, LABSPEC, PHURINE, GLUCOSEU, HGBUR, BILIRUBINUR, KETONESUR, PROTEINUR, UROBILINOGEN, NITRITE, LEUKOCYTESUR in the last 72 hours.  Invalid input(s): APPERANCEUR    Imaging: DG Chest Port 1 View  Result Date: 02/17/2021 CLINICAL DATA:  Acute respiratory failure EXAM: PORTABLE CHEST 1 VIEW COMPARISON:  Two days ago FINDINGS: Endotracheal tube with tip at the clavicular heads. Enteric tube reaches the stomach. Right IJ line with tip at the SVC. Right PICC tip at the SVC. Cardiomegaly and vascular pedicle widening. Diffuse pulmonary opacity. No visible effusion or pneumothorax. IMPRESSION: Stable hardware positioning, cardiomegaly, and pulmonary infiltrates. Electronically Signed   By: Monte Fantasia M.D.   On: February 17, 2021 05:26     Medications:   . sodium chloride Stopped (02/06/2021 0528)  . ceFEPime (MAXIPIME) IV 1 g (2021/02/17 0901)  . dexmedetomidine (PRECEDEX) IV infusion 0.63 mcg/kg/hr (Feb 17, 2021 0844)  . famotidine (PEPCID) IV Stopped (01/31/21 2143)  . feeding supplement (NEPRO CARB STEADY) 1,000 mL (02/17/2021 0523)  . fentaNYL  infusion INTRAVENOUS 250 mcg/hr (02-13-2021 0844)  . heparin 1,900 Units/hr (02/13/21 0844)  . norepinephrine (LEVOPHED) Adult infusion 16 mcg/min (Feb 13, 2021 0844)  . vancomycin     . amiodarone  400 mg Per Tube BID   Followed by  . [START ON 02/08/2021] amiodarone  200 mg Per Tube Daily  . chlorhexidine gluconate (MEDLINE KIT)  15 mL Mouth Rinse BID  . Chlorhexidine Gluconate Cloth  6 each Topical Q0600  . diltiazem  30 mg Per Tube Q6H  . feeding supplement (PROSource TF)  45 mL Per Tube BID  . Gerhardt's butt cream   Topical QID  . insulin aspart  0-20 Units Subcutaneous Q4H  . insulin aspart  5 Units Subcutaneous Q4H  . insulin detemir   22 Units Subcutaneous Daily  . mouth rinse  15 mL Mouth Rinse 10 times per day  . metoprolol tartrate  50 mg Per Tube BID  . sodium chloride flush  10-40 mL Intracatheter Q12H  . sodium chloride flush  10-40 mL Intracatheter Q12H  . sodium chloride flush  3 mL Intravenous Q12H   sodium chloride, acetaminophen (TYLENOL) oral liquid 160 mg/5 mL, acetaminophen, albuterol, glycopyrrolate, guaiFENesin-dextromethorphan, midazolam, ondansetron (ZOFRAN) IV, polyvinyl alcohol, sodium chloride flush, sodium chloride flush, sodium chloride flush  Assessment/ Plan:  Dana Bruce is a 55 y.o. white female with hypertension, IBS, diabetes mellitus type II, who was admitted to [facility] on 01/28/2021 for Acute respiratory failure with hypoxia ([place]) [J96.01] Pneumonia due to COVID-19 virus [U07.1, J12.82] COVID-19 [U07.1]  1. Acute kidney injury:    AKI likely secondary to ATN  02/20 0701 - 02/21 0700 In: 3768.5 [I.V.:1868.5; NG/GT:1850; IV Piggyback:50] Out: 23 [Urine:23]  Lab Results  Component Value Date   CREATININE 6.14 (H) 02-13-2021   CREATININE 5.90 (H) 01/31/2021   CREATININE 6.04 (H) 01/30/2021     Declining urine output.   mild hyperkalemia - Hold home medication of telmisartan. HD # 1 on 01/31/2021 Will monitor;   HR in 13-140's today with BO in low 100's Too unstable for HD today Will re-evaluate tomorrow  #. 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   Lab Results  Component Value Date   HGBA1C 6.3 (H) 02/06/2021   hold metformin  #. Acute respiratory failure: requiring intubation and mechanical ventilation. COVID-19 infection, ARDS.  Off isolation now     LOS: [place] 03-05-202210:13 AM

## 2021-02-09 NOTE — Consult Note (Signed)
Pharmacy Antibiotic Note  Dana Bruce is a 55 y.o. female admitted on 2021/01/28 with covid+ pneumonia. Pt is intubated since 2/4 with multiple failed extubation attempts. 2/8 Blood cultures returned with 1/2 sets MRSE, likely a contaminant. 2/17 pt with frothy pink sputum per ETT. Because patient remains febrile with leukocytosis, vancomycin was added back to regimen on 2/21. Pt has severe AKI, undergoing temporary dialysis when stable enough to receive, last session 2/20. Repeat blood cultures have been ordered. Pharmacy has been consulted for Vancomycin dosing.  Plan: -Continue cefepime 1g IV q24h -Vanc 2000 mg x 1 dose -Monitor HD plan, continue vanc dosing per variable renal function -Follow repeat blood cultures  Height: 5\' 7"  (170.2 cm) Weight: (!) 191.9 kg (423 lb) IBW/kg (Calculated) : 61.6  Temp (24hrs), Avg:101 F (38.3 C), Min:99 F (37.2 C), Max:102.56 F (39.2 C)  Recent Labs  Lab 01/28/21 0305 02/08/2021 0409 01/30/21 0351 01/31/21 0348 01/31/21 0350 01/31/2021 0458 01/30/2021 1127  WBC 15.7* 16.7* 13.6* 17.9*  --  15.3*  --   CREATININE 3.60* 4.64* 6.04*  --  5.90* 6.14*  --   LATICACIDVEN  --   --   --   --   --   --  0.6    Estimated Creatinine Clearance: 18.6 mL/min (A) (by C-G formula based on SCr of 6.14 mg/dL (H)).    Allergies  Allergen Reactions  . Lasix [Furosemide] Itching  . Penicillins Hives and Itching    Has patient had a PCN reaction causing immediate rash, facial/tongue/throat swelling, SOB or lightheadedness with hypotension: yes Has patient had a PCN reaction causing severe rash involving mucus membranes or skin necrosis: no Has patient had a PCN reaction that required hospitalization No Has patient had a PCN reaction occurring within the last 10 years: No If all of the above answers are "NO", then may proceed with Cephalosporin use.   02/03/21 Flavor [Flavoring Agent] Itching, Nausea And Vomiting and Rash    Breaks out in places where she  touches strawberries     Antimicrobials this admission: Vancomycin 2/8 >> 2/14, 2/21 >> Cefepime 2/9 >> Levofloxacin 2/3 >> 2/8  Microbiology results: 2/3 BCx: NG 2/4 Resp Cx: normal flora 2/8 Resp Cx: normal flora  2/8 BCx: GPC - MRSE in 2 of 4 bottles (1 set) 2/9 MRSA PCR neg 2/11 BCx: NG 2/14 tracheal aspirate: candida albicans 2/21 Bcx: pending  Thank you for allowing pharmacy to be a part of this patient's care.  3/21, PharmD Pharmacy Resident  01/27/2021 1:06 PM

## 2021-02-09 NOTE — Progress Notes (Signed)
Daily Progress Note   Patient Name: Dana Bruce       Date: Feb 06, 2021 DOB: 05/06/1966  Age: 55 y.o. MRN#: [id number] Attending Physician: Nelle Don, MD Primary Care Physician: Maury Dus, MD Admit Date: 01/23/2021  Reason for Consultation/Follow-up: Establishing goals of care  Subjective: Patient is resting in bed on ventilator. Husband is at bedside. We discussed her status. We discussed multiple scenarios and none of them will provide a QOL she would be accepting of.  He states he understands her prognosis and does not want her to suffer. He states he would like to bring his children in to visit and then shift to comfort care. CCM made aware. All questions answered.    Length of Stay: 18  Current Medications: Scheduled Meds:  . amiodarone  400 mg Per Tube BID   Followed by  . [START ON 02/08/2021] amiodarone  200 mg Per Tube Daily  . chlorhexidine gluconate (MEDLINE KIT)  15 mL Mouth Rinse BID  . Chlorhexidine Gluconate Cloth  6 each Topical Q0600  . diltiazem  30 mg Per Tube Q6H  . feeding supplement (PROSource TF)  45 mL Per Tube BID  . Gerhardt's butt cream   Topical QID  . insulin aspart  0-20 Units Subcutaneous Q4H  . insulin aspart  5 Units Subcutaneous Q4H  . insulin detemir  22 Units Subcutaneous Daily  . mouth rinse  15 mL Mouth Rinse 10 times per day  . metoprolol tartrate  50 mg Per Tube BID  . sodium chloride flush  10-40 mL Intracatheter Q12H  . sodium chloride flush  10-40 mL Intracatheter Q12H  . sodium chloride flush  3 mL Intravenous Q12H  . vancomycin variable dose per unstable renal function (pharmacist dosing)   Does not apply See admin instructions    Continuous Infusions: . sodium chloride Stopped (02/02/2021 0528)  . ceFEPime (MAXIPIME) IV Stopped  (02-06-21 0931)  . dexmedetomidine (PRECEDEX) IV infusion 0.6 mcg/kg/hr (02/06/2021 1500)  . famotidine (PEPCID) IV Stopped (01/31/21 2143)  . feeding supplement (NEPRO CARB STEADY) 65 mL/hr at 02/06/2021 1200  . fentaNYL infusion INTRAVENOUS 200 mcg/hr (06-Feb-2021 1500)  . heparin 1,900 Units/hr (02/06/2021 1500)  . phenylephrine (NEO-SYNEPHRINE) Adult infusion Stopped (02-06-21 1335)  . phenylephrine (NEO-SYNEPHRINE) Adult infusion 125 mcg/min (February 06, 2021 1500)    PRN Meds: sodium  chloride, acetaminophen (TYLENOL) oral liquid 160 mg/5 mL, acetaminophen, albuterol, artificial tears, guaiFENesin-dextromethorphan, midazolam, ondansetron (ZOFRAN) IV, sodium chloride flush, sodium chloride flush, sodium chloride flush  Physical Exam Constitutional:      Comments: On ventilator.              Vital Signs: BP (!) 98/46   Pulse (!) 118   Temp (!) 101.84 F (38.8 C)   Resp 11   Ht 5' 7"  (1.702 m)   Wt (!) 191.9 kg   SpO2 97%   BMI 66.25 kg/m  SpO2: SpO2: 97 % O2 Device: O2 Device: Ventilator O2 Flow Rate: O2 Flow Rate (L/min): 45 L/min  Intake/output summary:   Intake/Output Summary (Last 24 hours) at Feb 09, 2021 1521 Last data filed at February 09, 2021 1500 Gross per 24 hour  Intake 5257.01 ml  Output 18 ml  Net 5239.01 ml   LBM: Last BM Date: Feb 09, 2021 Baseline Weight: Weight: (!) 204.1 kg Most recent weight: Weight: (!) 191.9 kg      Patient Active Problem List   Diagnosis Date Noted  . COVID-19 01/28/2021  . Right thyroid nodule, 2.9 cm 03/24/2014  . Thyroid goiter 03/24/2014    Palliative Care Assessment & Plan    Recommendations/Plan: Family coming to spend time with patient and then will want to transition to comfort care.    Code Status:    Code Status Orders  (From admission, onward)         Start     Ordered   01/27/21 1509  Do not attempt resuscitation (DNR)  Continuous       Question Answer Comment  In the event of cardiac or respiratory ARREST Do not call a  "code blue"   In the event of cardiac or respiratory ARREST Do not perform Intubation, CPR, defibrillation or ACLS   In the event of cardiac or respiratory ARREST Use medication by any route, position, wound care, and other measures to relive pain and suffering. May use oxygen, suction and manual treatment of airway obstruction as needed for comfort.      01/27/21 1508        Code Status History    Date Active Date Inactive Code Status Order ID Comments User Context   01/23/2021 3437 01/27/2021 1508 Full Code [id number]  Flora Lipps, MD ED   Advance Care Planning Activity      Prognosis:  < 2 weeks    Care plan was discussed with CCM  Thank you for allowing the Palliative Medicine Team to assist in the care of this patient.   Total Time 25 min Prolonged Time Billed  no       Greater than 50%  of this time was spent counseling and coordinating care related to the above assessment and plan.  Asencion Gowda, NP  Please contact Palliative Medicine Team phone at [phone] for questions and concerns.

## 2021-02-09 NOTE — Death Summary Note (Signed)
DEATH SUMMARY   Patient Details  Name: Dana Bruce MRN: [id number] DOB: 04/27/1966  Admission/Discharge Information   Admit Date:  01/17/2021  Date of Death:  2021/11/09  Time of Death:  2129  Length of Stay: 18  Referring Physician: Elias Elseeade, Robert, MD   Reason(s) for Hospitalization  Shortness of Breath   Diagnoses  Preliminary cause of death: Pneumonia due to COVID-19 virus Secondary Diagnoses (including complications and co-morbidities):  Active Problems:   COVID-19   Atrial Fibrillation    Acute renal failure   Brief Hospital Course (including significant findings, care, treatment, and services provided and events leading to death)  Dana Bruce is a [age] y.o. year old female who presented to [facility] ER on 002/10/2021 with acute hypoxic respiratory failure in the setting of COVID-19 pneumonia.  Pt initially placed on Bipap, however due to worsening hypoxia she required mechanical intubation on 01/15/2021.  Hospital course complicated by acute renal failure requiring initiation of hemodialysis on 01/30/2021 per nephrology recommendations.  Despite aggressive treatment pt continued to decline and palliative care consulted.  Following discussions with pts family regarding goals of treatment pts family decided to transition pt to comfort measures only on 2021/11/09.  The pt expired on 2021/11/09 at 2129 with family present at bedside.   Pertinent Labs and Studies  Significant Diagnostic Studies DG Chest 1 View  Result Date: 02/08/2021 CLINICAL DATA:  Shortness of breath. EXAM: CHEST  1 VIEW COMPARISON:  November 18, 2016 FINDINGS: There are diffuse bilateral ground-glass airspace opacities. No pneumothorax. No definite large pleural effusion. The heart size is stable. Aortic calcifications are suspected. There is no acute osseous abnormality. IMPRESSION: Diffuse bilateral ground-glass airspace opacities consistent with the patient's history of viral pneumonia. Electronically Signed   By:  Katherine Mantlehristopher  Green M.D.   On: 01/14/2021 15:15   DG Abd 1 View  Result Date: 01/15/2021 CLINICAL DATA:  NG placement EXAM: ABDOMEN - 1 VIEW COMPARISON:  None. FINDINGS: NG tube enters the stomach with the tip in the body the stomach. Stomach decompressed. No dilated bowel loops. IMPRESSION: NG tube in the body the stomach.  No dilated bowel loops identified. Electronically Signed   By: Marlan Palauharles  Clark M.D.   On: 01/15/2021 09:56   PERIPHERAL VASCULAR CATHETERIZATION  Result Date: 02/03/2021 See op note  DG Chest Port 1 View  Result Date: Jul 07, 2021 CLINICAL DATA:  Acute respiratory failure EXAM: PORTABLE CHEST 1 VIEW COMPARISON:  Two days ago FINDINGS: Endotracheal tube with tip at the clavicular heads. Enteric tube reaches the stomach. Right IJ line with tip at the SVC. Right PICC tip at the SVC. Cardiomegaly and vascular pedicle widening. Diffuse pulmonary opacity. No visible effusion or pneumothorax. IMPRESSION: Stable hardware positioning, cardiomegaly, and pulmonary infiltrates. Electronically Signed   By: Marnee SpringJonathon  Watts M.D.   On: 2021/11/09 05:26   DG Chest Port 1 View  Result Date: 01/30/2021 CLINICAL DATA:  Acute respiratory failure with hypoxia EXAM: PORTABLE CHEST 1 VIEW COMPARISON:  Radiograph 01/28/2021 FINDINGS: *Endotracheal tube tip terminates in the mid trachea, 3.2 cm from the expected location of the carina. *A right IJ catheter tip terminates in the lower SVC. *Right upper extremity PICC tip terminates at the level of the right atrium. *Transesophageal tube tip and side port terminate below the margins of imaging, beyond the GE junction. * Telemetry leads and external support devices overlie the chest. Persistent heterogeneous bilateral airspace opacities, left greater than right. Some increasing coalescence is noted in the right mid lung and left  basilar periphery. Stable cardiomediastinal contours with mild cardiomegaly and a calcified aorta. No visible pneumothorax or effusion  the portions of the left costophrenic sulcus are collimated. No acute osseous or soft tissue abnormality. IMPRESSION: 1. Persistent heterogeneous bilateral airspace opacities, left greater than right. Some increasing coalescence in the right mid lung and left basilar periphery. 2. Stable cardiomegaly. 3. Support devices, as above. Electronically Signed   By: Kreg Shropshire M.D.   On: 01/30/2021 02:03   DG Chest Port 1 View  Result Date: 01/28/2021 CLINICAL DATA:  Respiratory failure with hypoxia. EXAM: PORTABLE CHEST 1 VIEW COMPARISON:  01/25/2021. FINDINGS: Endotracheal tube and NG tube in stable position. Cardiomegaly. Low lung volumes. Diffuse bilateral pulmonary infiltrates/edema again noted without interim change. Small right pleural effusion cannot be excluded. No pneumothorax. IMPRESSION: 1. Lines and tubes in stable position. 2. Cardiomegaly. 3. Low lung volumes. Diffuse bilateral pulmonary infiltrates/edema again noted without interim change. Small right pleural effusion cannot be excluded. Electronically Signed   By: Maisie Fus  Register   On: 01/28/2021 05:29   DG Chest Port 1 View  Result Date: 01/25/2021 CLINICAL DATA:  01/19/2021 EXAM: PORTABLE CHEST 1 VIEW COMPARISON:  Six days ago FINDINGS: Endotracheal tube with tip at the clavicular heads. The enteric tube at least reaches the stomach. Right PICC with tip at the upper right atrium. Stable cardiomegaly, low lung volumes, and pulmonary opacification. IMPRESSION: Stable hardware positioning and pulmonary opacification. Electronically Signed   By: Marnee Spring M.D.   On: 01/25/2021 06:31   DG Chest Port 1 View  Result Date: 01/19/2021 CLINICAL DATA:  Acute respiratory failure with hypoxia. EXAM: PORTABLE CHEST 1 VIEW COMPARISON:  01/17/2021 FINDINGS: The endotracheal tube terminates above the carina. The enteric tube extends below the left hemidiaphragm. There are bilateral airspace opacities which are slightly improved from prior study. There  is no pneumothorax or large pleural effusion. The heart size is stable. Aortic calcifications are noted. IMPRESSION: 1. Lines and tubes as above. 2. Improving bilateral airspace opacities. Electronically Signed   By: Katherine Mantle M.D.   On: 01/19/2021 01:37   DG Chest Port 1 View  Result Date: 01/17/2021 CLINICAL DATA:  55 year old female COVID-19.  Intubated. EXAM: PORTABLE CHEST 1 VIEW COMPARISON:  Portable chest 01/15/2021 and earlier. FINDINGS: Portable AP upright view at 0412 hours. Endotracheal tube tip stable at the level the clavicles. Enteric tube courses to the abdomen, tip not included. Larger lung volumes. Mediastinal contours remain normal. No pneumothorax. No pleural effusion is evident. Multifocal Patchy and confluent perihilar and lower lung opacity is greater on the right. Left lung base ventilation has improved. Lung apices remain relatively spared. No areas of worsening ventilation. IMPRESSION: 1. Stable lines and tubes. 2. Larger lung volumes. Bilateral pneumonia with improved left lung base ventilation. Electronically Signed   By: Odessa Fleming M.D.   On: 01/17/2021 05:36   DG Chest Port 1 View  Result Date: 01/15/2021 CLINICAL DATA:  Feeding tube placement. Endotracheal tube placement. COVID positive EXAM: PORTABLE CHEST 1 VIEW COMPARISON:  01/15/2021 FINDINGS: Endotracheal tube has been placed in good position. NG tube enters the stomach with the tip not visualized. Diffuse bilateral airspace disease unchanged from earlier today. No pleural effusion. No pneumothorax. IMPRESSION: Endotracheal tube in good position.  NG tube enters the stomach. Diffuse bilateral airspace disease unchanged. Electronically Signed   By: Marlan Palau M.D.   On: 01/15/2021 09:42   DG Chest Port 1 View  Result Date: 01/15/2021 CLINICAL DATA:  History of  COVID-19 pneumonia, evaluate for interval change EXAM: PORTABLE CHEST 1 VIEW COMPARISON:  01/30/2021 FINDINGS: Cardiac shadow is stable. Patchy airspace  disease is noted within both lungs consistent with the given clinical history. The overall appearance is slightly improved when compared with the prior exam however. No bony abnormality is noted. IMPRESSION: Slight improved aeration when compared with the prior exam. Persistent changes of COVID-19 pneumonia are seen. Electronically Signed   By: Alcide Clever M.D.   On: 01/15/2021 02:55   [place] EKG SITE RITE  Result Date: 01/23/2021 If [facility] image not attached, placement could not be confirmed due to current cardiac rhythm.  [place] EKG SITE RITE  Result Date: 01/15/2021 If Site Rite image not attached, placement could not be confirmed due to current cardiac rhythm.   Microbiology Recent Results (from the past 240 hour(s))  Culture, respiratory (non-expectorated)     Status: None   Collection Time: 01/25/21 10:30 AM   Specimen: Tracheal Aspirate; Respiratory  Result Value Ref Range Status   Specimen Description   Final    TRACHEAL ASPIRATE Performed at [facility], [address] [place], [place] [address]    Special Requests   Final    NONE Performed at [facility], [address] [place], [place] [address]    Gram Stain   Final    MODERATE WBC PRESENT,BOTH PMN AND MONONUCLEAR NO ORGANISMS SEEN Performed at [facility], [address], [place], [place] [address]    Culture FEW CANDIDA ALBICANS  Final   Report Status 01/28/2021 FINAL  Final    Lab Basic Metabolic Panel: Recent Labs  Lab 01/28/21 0305 02/05/2021 0409 01/30/21 0351 01/30/21 1108 01/31/21 0348 01/31/21 0350 2021-02-08 0458  NA 141 138 136  --   --  136 133*  K 4.3 4.5 5.1  --   --  5.2* 5.2*  CL 109 109 105  --   --  105 99  CO2 21* 20* 20*  --   --  20* 23  GLUCOSE 210* 196* 194*  --   --  225* 246*  BUN 130* 136* 153*  --   --  130* 114*  CREATININE 3.60* 4.64* 6.04*  --   --  5.90* 6.14*  CALCIUM 10.0 9.9 9.4  --   --  8.8* 8.8*  MG 2.6* 2.7* 2.7*  --  2.7*  --  2.7*  PHOS  6.9* 8.2* 10.2* 10.1*  --  9.5* 8.8*   Liver Function Tests: Recent Labs  Lab 01/28/21 0305 02/03/2021 0409 01/30/21 0351 01/31/21 0350 02/08/21 0458  ALBUMIN 2.2* 2.4* 2.2* 2.0* 2.1*   No results for input(s): LIPASE, AMYLASE in the last 168 hours. No results for input(s): AMMONIA in the last 168 hours. CBC: Recent Labs  Lab 01/28/21 0305 01/31/2021 0409 01/30/21 0351 01/31/21 0348 February 08, 2021 0458  WBC 15.7* 16.7* 13.6* 17.9* 15.3*  NEUTROABS 11.4* 11.8* 9.5* 13.2* 11.7*  HGB 8.6* 8.4* 7.3* 7.9* 7.3*  HCT 27.9* 27.8* 24.3* 25.2* 23.5*  MCV 86.9 87.1 87.7 86.9 87.0  PLT 306 323 268 274 283   Cardiac Enzymes: No results for input(s): CKTOTAL, CKMB, CKMBINDEX, TROPONINI in the last 168 hours. Sepsis Labs: Recent Labs  Lab 01/27/21 0530 01/28/21 0305 01/28/2021 0409 01/30/21 0351 01/31/21 0348 02-08-21 0458 Feb 08, 2021 1127  PROCALCITON 0.30  --   --   --  1.43 2.71  --   WBC 13.4*   < > 16.7* 13.6* 17.9* 15.3*  --  LATICACIDVEN  --   --   --   --   --   --  0.6   < > = values in this interval not displayed.    Procedures/Operations  Mechanical Intubation  Temporary Dialysis Catheter Placement in Right Internal Jugular  Sonda Rumble, AGNP  Pulmonary/Critical Care Pager [phone] (please enter 7 digits) PCCM Consult Pager [phone] (please enter 7 digits)

## 2021-02-09 NOTE — Consult Note (Signed)
PHARMACY CONSULT NOTE  Pharmacy Consult for Electrolyte Monitoring and Replacement   Recent Labs: Potassium (mmol/L)  Date Value  02-Feb-2021 5.2 (H)   Magnesium (mg/dL)  Date Value  79/02/8332 2.7 (H)   Calcium (mg/dL)  Date Value  83/29/1916 8.8 (L)   Albumin (g/dL)  Date Value  60/60/0459 2.1 (L)   Phosphorus (mg/dL)  Date Value  97/74/1423 8.8 (H)   Sodium (mmol/L)  Date Value  February 02, 2021 133 (L)   Corrected Ca: 10.3 mg/dL  Assessment: 55 yo F with COVID-19 presenting with acute hypoxic respiratory failure in the setting of COVID-19 pneumonia. PMH includes HTN, diabetes, and IBS. She was scheduled for HD today but was postponed d/t instability  Pharmacy consulted for electrolyte monitoring and replacement.  Nutrition: Pro[employer] 45 mL BID + tube feeds   Goal of Therapy:  Potassium 4.0 - 5.1 mmol/L Magnesium 2.0 - 2.4 mg/dL All Other Electrolytes WNL  Plan:  - No electrolyte replacement needed today - Will follow-up electrolytes with tomorrow's AM labs  Lowella Bandy, PharmD, BCPS Clinical Pharmacist February 02, 2021 7:08 AM

## 2021-02-09 NOTE — Progress Notes (Signed)
Pt extubated to room air per NP order. RN and Environmental health practitioner at bedside.

## 2021-02-09 DEATH — deceased

## 2022-07-13 IMAGING — DX DG CHEST 1V
1 series · 1 of 1 positions shown · non-contrast
Comparison: November 18, 2016

CLINICAL DATA: Shortness of breath.

EXAM:
CHEST  1 VIEW

[chest ap]
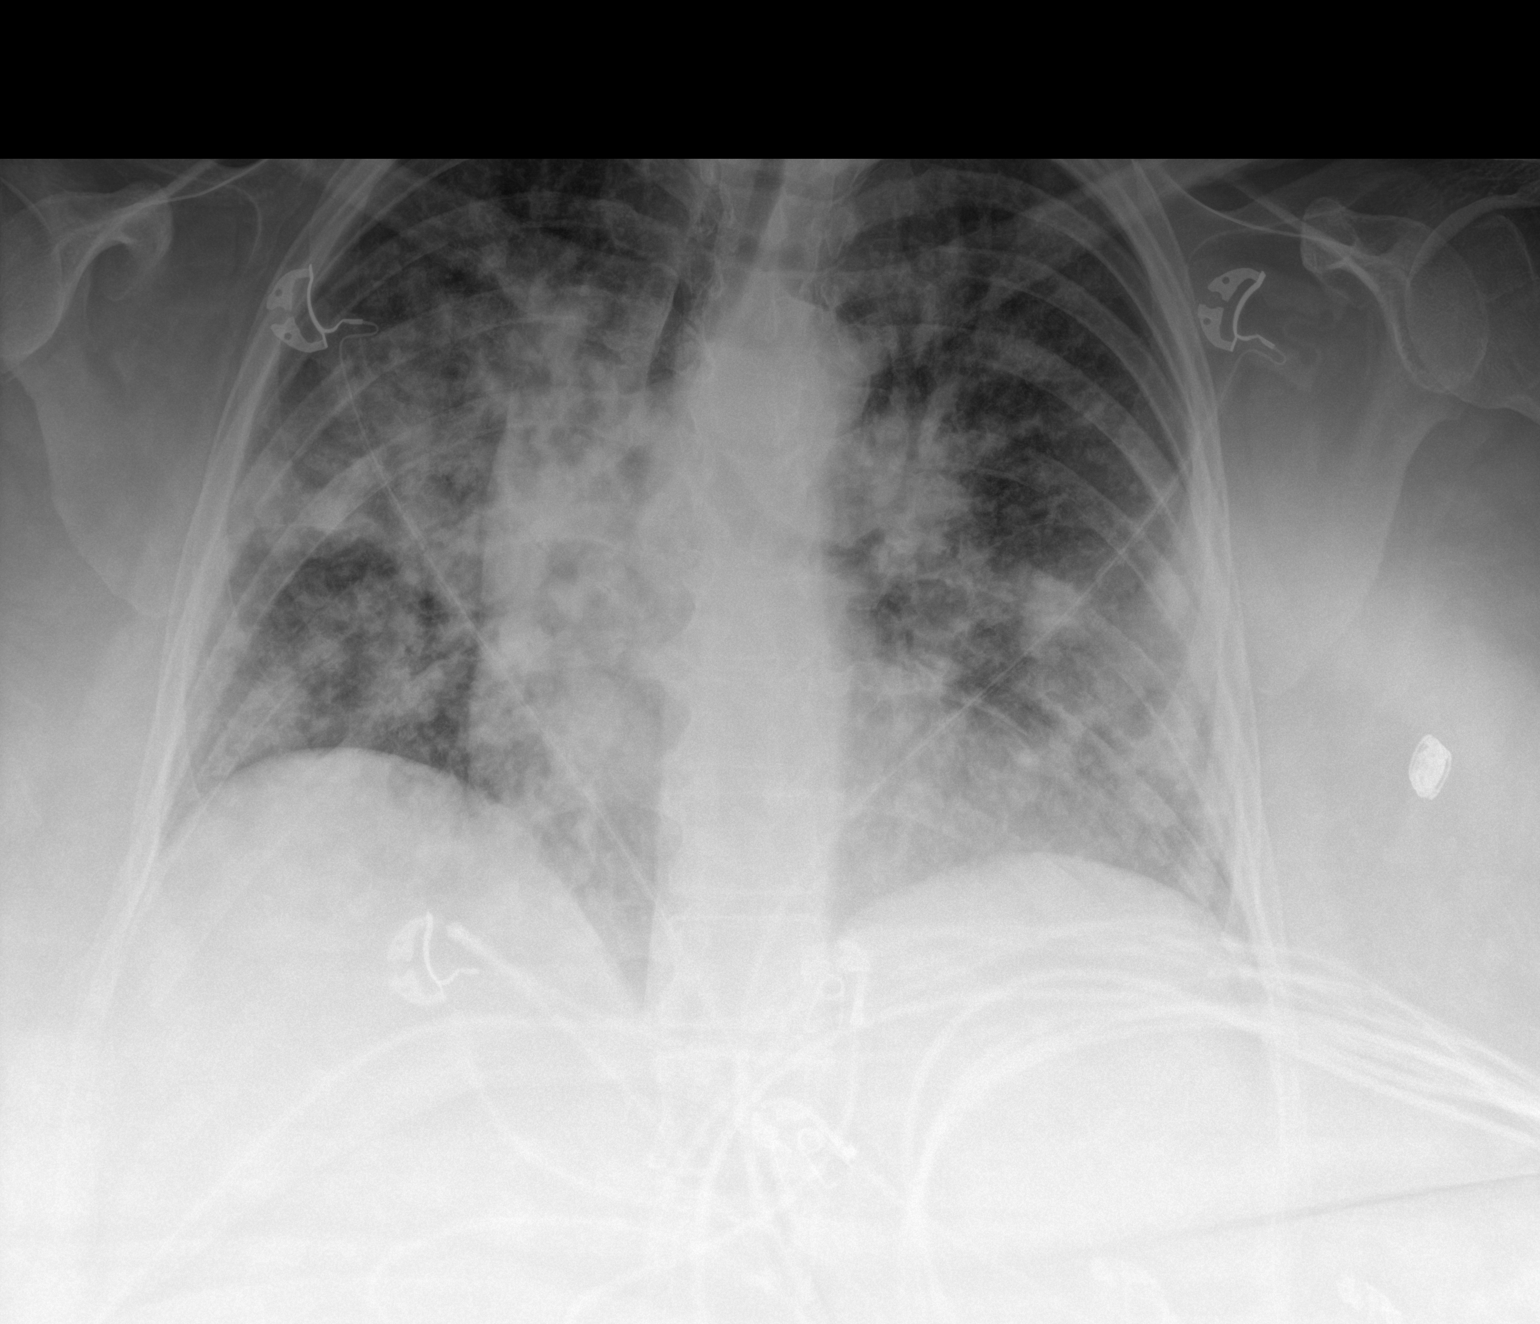

[1 of 1 positions shown; findings below may reference images not displayed]

FINDINGS: There are diffuse bilateral ground-glass airspace opacities. No
pneumothorax. No definite large pleural effusion. The heart size is
stable. Aortic calcifications are suspected. There is no acute
osseous abnormality.
IMPRESSION: Diffuse bilateral ground-glass airspace opacities consistent with
the patient's history of viral pneumonia.

## 2022-07-14 IMAGING — DX DG CHEST 1V PORT
1 series · 1 of 1 positions shown · non-contrast
Comparison: 01/14/2021

CLINICAL DATA: History of S1VGL-J1 pneumonia, evaluate for interval
change

EXAM:
PORTABLE CHEST 1 VIEW

[chest ap]
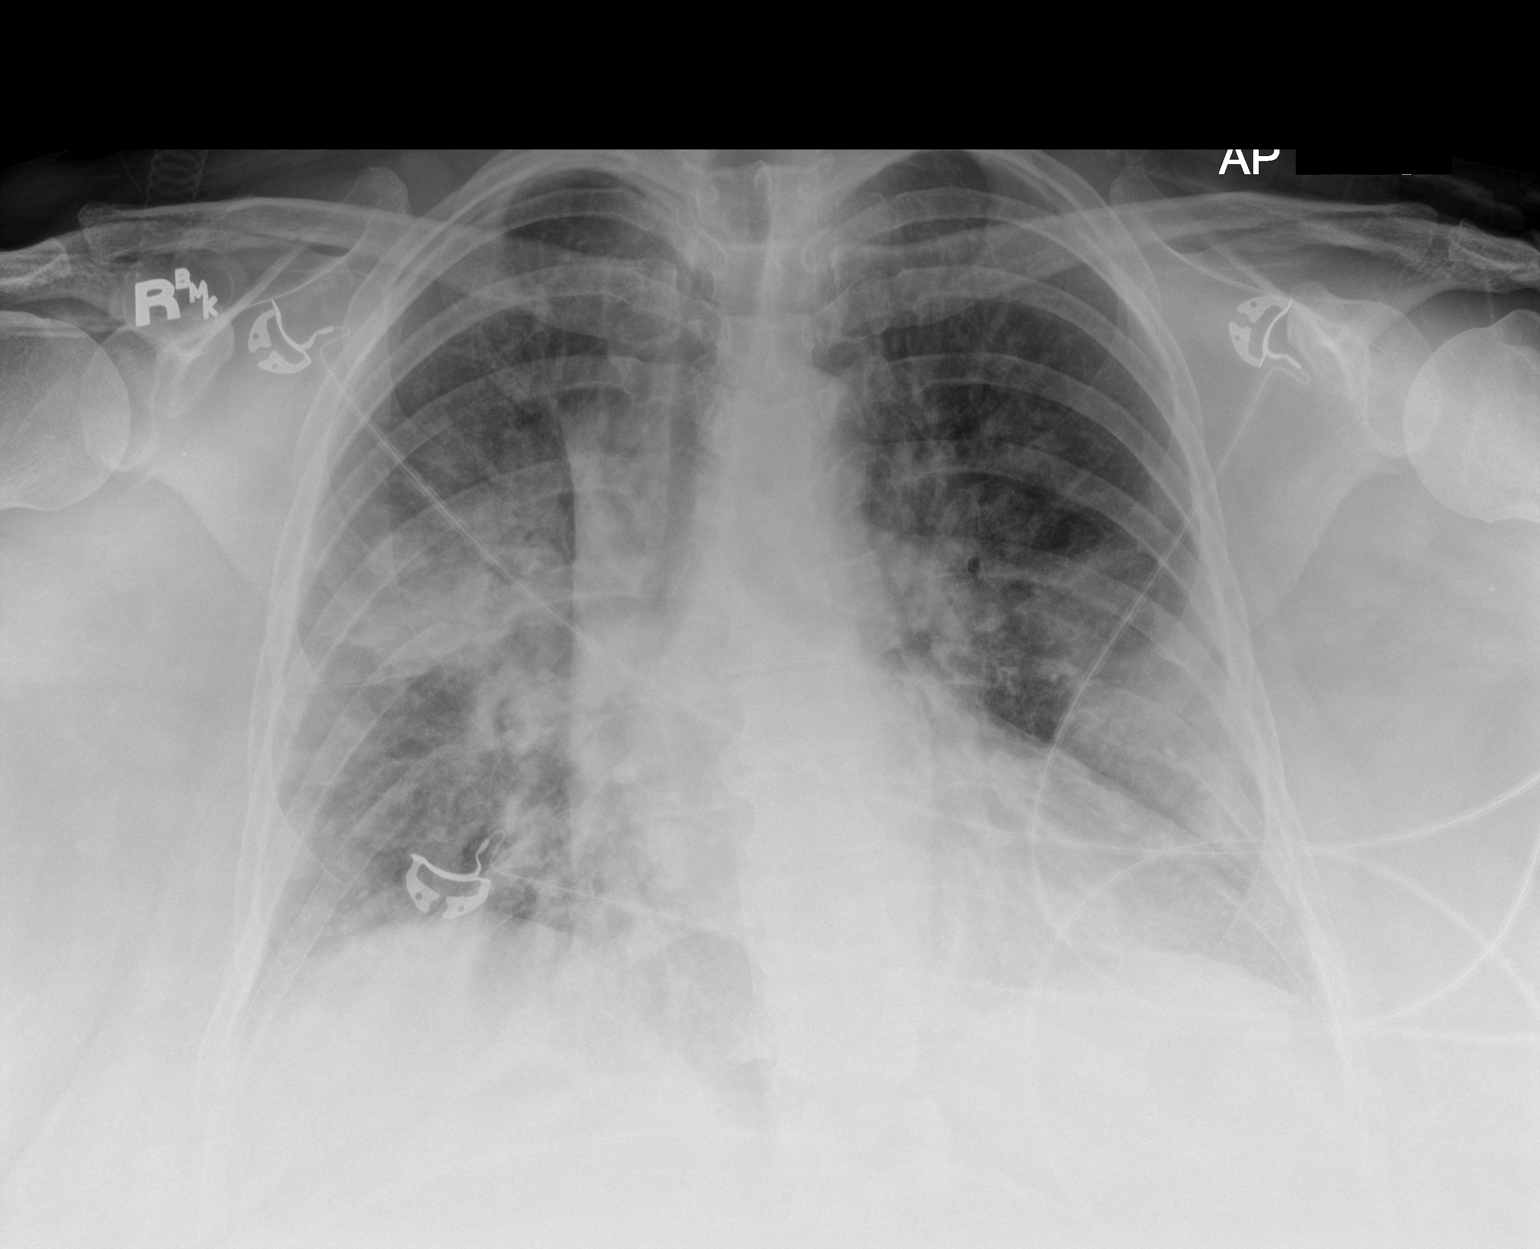

[1 of 1 positions shown; findings below may reference images not displayed]

FINDINGS: Cardiac shadow is stable. Patchy airspace disease is noted within
both lungs consistent with the given clinical history. The overall
appearance is slightly improved when compared with the prior exam
however. No bony abnormality is noted.
IMPRESSION: Slight improved aeration when compared with the prior exam.
Persistent changes of S1VGL-J1 pneumonia are seen.
